# Patient Record
Sex: Female | Born: 1965 | Race: White | Hispanic: No | Smoking: Former smoker
Health system: Southern US, Community
[De-identification: ages and names within clinical notes are randomized; demographics above are authoritative.]

## PROBLEM LIST (undated history)

## (undated) ENCOUNTER — Inpatient Hospital Stay (HOSPITAL_COMMUNITY): Payer: 59 | Admitting: Podiatry

## (undated) ENCOUNTER — Inpatient Hospital Stay (HOSPITAL_BASED_OUTPATIENT_CLINIC_OR_DEPARTMENT_OTHER): Payer: 59 | Admitting: Podiatry

## (undated) DIAGNOSIS — R51 Headache: Secondary | ICD-10-CM

## (undated) DIAGNOSIS — R413 Other amnesia: Secondary | ICD-10-CM

## (undated) DIAGNOSIS — M791 Myalgia, unspecified site: Secondary | ICD-10-CM

## (undated) DIAGNOSIS — R609 Edema, unspecified: Secondary | ICD-10-CM

## (undated) DIAGNOSIS — K589 Irritable bowel syndrome without diarrhea: Secondary | ICD-10-CM

## (undated) DIAGNOSIS — F32A Depression, unspecified: Secondary | ICD-10-CM

## (undated) DIAGNOSIS — M1990 Unspecified osteoarthritis, unspecified site: Secondary | ICD-10-CM

## (undated) DIAGNOSIS — K760 Fatty (change of) liver, not elsewhere classified: Secondary | ICD-10-CM

## (undated) DIAGNOSIS — R5383 Other fatigue: Secondary | ICD-10-CM

## (undated) DIAGNOSIS — R42 Dizziness and giddiness: Secondary | ICD-10-CM

## (undated) DIAGNOSIS — E11319 Type 2 diabetes mellitus with unspecified diabetic retinopathy without macular edema: Secondary | ICD-10-CM

## (undated) DIAGNOSIS — C20 Malignant neoplasm of rectum: Secondary | ICD-10-CM

## (undated) DIAGNOSIS — M79669 Pain in unspecified lower leg: Secondary | ICD-10-CM

## (undated) DIAGNOSIS — E119 Type 2 diabetes mellitus without complications: Secondary | ICD-10-CM

## (undated) DIAGNOSIS — R519 Headache, unspecified: Secondary | ICD-10-CM

## (undated) DIAGNOSIS — F329 Major depressive disorder, single episode, unspecified: Secondary | ICD-10-CM

## (undated) DIAGNOSIS — Z87442 Personal history of urinary calculi: Secondary | ICD-10-CM

## (undated) HISTORY — PX: HERNIA REPAIR: SHX51

## (undated) HISTORY — PX: ABDOMINAL HYSTERECTOMY: SHX81

## (undated) HISTORY — DX: Headache, unspecified: R51.9

## (undated) HISTORY — DX: Other fatigue: R53.83

## (undated) HISTORY — DX: Pain in unspecified lower leg: M79.669

## (undated) HISTORY — PX: BREAST SURGERY: SHX581

## (undated) HISTORY — DX: Myalgia, unspecified site: M79.10

## (undated) HISTORY — DX: Depression, unspecified: F32.A

## (undated) HISTORY — DX: Type 2 diabetes mellitus without complications: E11.9

## (undated) HISTORY — DX: Malignant neoplasm of rectum: C20

## (undated) HISTORY — DX: Dizziness and giddiness: R42

## (undated) HISTORY — PX: OTHER SURGICAL HISTORY: SHX169

## (undated) HISTORY — PX: BLADDER SURGERY: SHX569

## (undated) HISTORY — DX: Major depressive disorder, single episode, unspecified: F32.9

## (undated) HISTORY — PX: KNEE SURGERY: SHX244

## (undated) HISTORY — DX: Edema, unspecified: R60.9

## (undated) HISTORY — DX: Other amnesia: R41.3

## (undated) HISTORY — DX: Irritable bowel syndrome, unspecified: K58.9

## (undated) HISTORY — DX: Headache: R51

## (undated) HISTORY — PX: TOE AMPUTATION: SHX809

## (undated) MED FILL — Leucovorin Calcium For Inj 350 MG: INTRAMUSCULAR | Qty: 37.2 | Status: AC

---

## 2003-03-23 ENCOUNTER — Ambulatory Visit (HOSPITAL_BASED_OUTPATIENT_CLINIC_OR_DEPARTMENT_OTHER): Admission: RE | Admit: 2003-03-23 | Discharge: 2003-03-23 | Payer: Self-pay | Admitting: Orthopedic Surgery

## 2003-03-23 ENCOUNTER — Encounter (INDEPENDENT_AMBULATORY_CARE_PROVIDER_SITE_OTHER): Payer: Self-pay | Admitting: *Deleted

## 2003-07-24 ENCOUNTER — Encounter: Admission: RE | Admit: 2003-07-24 | Discharge: 2003-07-24 | Payer: Self-pay | Admitting: Sports Medicine

## 2003-07-24 ENCOUNTER — Encounter: Payer: Self-pay | Admitting: Sports Medicine

## 2008-05-02 ENCOUNTER — Ambulatory Visit (HOSPITAL_BASED_OUTPATIENT_CLINIC_OR_DEPARTMENT_OTHER): Admission: RE | Admit: 2008-05-02 | Discharge: 2008-05-03 | Payer: Self-pay | Admitting: Specialist

## 2008-05-02 ENCOUNTER — Encounter (INDEPENDENT_AMBULATORY_CARE_PROVIDER_SITE_OTHER): Payer: Self-pay | Admitting: Specialist

## 2011-02-26 NOTE — Op Note (Signed)
NAMESTEPFANIE, Gloria Hodge           ACCOUNT NO.:  [id number]   MEDICAL RECORD NO.:  [id number]          PATIENT TYPE:  AMB   LOCATION:  DSC                          FACILITY:  [facility]   PHYSICIAN:  Earvin Hansen L. Truesdale, M.D.DATE OF BIRTH:  Mar 05, 1966   DATE OF PROCEDURE:  05/02/2008  DATE OF DISCHARGE:                               OPERATIVE REPORT   A 45 year old lady with severe macromastia and back and shoulder pain  secondary to large pendulous breasts with increased accessory breast  tissue.   PROCEDURES DONE:  Bilateral breast reductions using the inferior pedicle  technique.   ANESTHESIA:  General.   DESCRIPTION OF PROCEDURE:  The patient underwent general anesthesia and  intubated orally after she had undergone drawings for the inferior-  pedicle reduction mammoplasty.  After she was intubated, prep was done  to the chest and breast areas in routine fashion using the Hibiclens  soap and solution, walled off with sterile towels and drapes so as to  make a sterile field.  The areas were scored with #15 blade, and the  skin of the inferior pedicle was deepithelialized with a #20 blade.  Medial and lateral fatty dermal pedicles were excised down to underlying  fascia.  After proper hemostasis, the new keyhole area was also debulked  and also accessory breast tissue was removed.  After proper hemostasis,  the flaps were transposed and stayed with 3-0 Prolene.  Subcutaneously,  tissues were closed with 3-0 Monocryl x2 layers and running subcuticular  stitches of 3-0 Monocryl and 5-0 Monocryl throughout the inverted T.  The wounds were drained with #10 fully fluted Blake drains, which were  placed in the depths of the wound and brought out through the lateral-  most portion of the incisions and secured with 3-0 Prolene; same  procedure was carried on both sides.  Steri-Strips and soft dressing  were applied as well as silicone gel patch.  As the patient is Hispanic,  we are going to  try to keep the scar as thin as possible.  She was then  taken to Recovery in excellent condition.   ESTIMATED BLOOD LOSS:  Less than 150 mL.   COMPLICATIONS:  None.      Yaakov Guthrie. Shon Hough, M.D.  Electronically Signed     GLT/MEDQ  D:  05/02/2008  T:  05/03/2008  Job:  [id number]

## 2011-03-01 NOTE — Op Note (Signed)
   NAMEJAKAIYA, Gloria Hodge                       ACCOUNT NO.:  [id number]   MEDICAL RECORD NO.:  [id number]                   PATIENT TYPE:  AMB   LOCATION:  DSC                                  FACILITY:  [facility]   PHYSICIAN:  Artist Pais. Mina Marble, M.D.           DATE OF BIRTH:  1965/12/10   DATE OF PROCEDURE:  03/23/2003  DATE OF DISCHARGE:                                 OPERATIVE REPORT   PREOPERATIVE DIAGNOSIS:  Right long middle phalangeal enchondroma.   POSTOPERATIVE DIAGNOSIS:  Right long middle phalangeal enchondroma.   PROCEDURE:  Curettage and bone graft above with distal radial bone graft and  Allometrics supplement.   SURGEON:  Artist Pais. Mina Marble, M.D.   ASSISTANT:  Aura Fey. Bobbe Medico.   ANESTHESIA:  General.   TOURNIQUET TIME:  45 minutes.   COMPLICATIONS:  None.   DRAINS:  None.   DESCRIPTION OF PROCEDURE:  The patient was taken to the operating room and  after the induction of adequate general anesthesia, the right upper  extremity was prepped and draped in the usual sterile fashion. An Esmarch  was used to exsanguinate the limb. The tourniquet was then inflated to 250  mmHg.  At this point in time, a longitudinal incision was made midline over  the middle phalanx of the right long finger. Incision was taken down through  the skin and subcutaneous tissues.  The extensor mechanism was identified  and retracted at the radial side and a subperiosteal dissection was  undertaken at the base of the middle phalanx on the ulnar side.  Intraoperative x-rays showed the enchondroma.  The enchondroma was carefully  curetted out after a cortical window was made dorsally and sent for  pathologic diagnosis.  The wound was thoroughly irrigated. A second incision  was then made over the dorsal radial aspect of the right wrist and a  cortical window was made in the radius. Bone graft was obtained and mixed  with Allometrics and then placed into enchondroma site and remaining  Allometrics was placed in the donor site.  Both wounds were thoroughly  irrigated.  The wounds were closed with 3-0 Prolene in subcuticular  stitches. Steri-Strips, 4x4's, fluffs, and a volar splint was applied to the  middle finger and wrist. The patient tolerated the procedure well and went  to the recovery room in stable condition.                                               Artist Pais Mina Marble, M.D.    MAW/MEDQ  D:  03/23/2003  T:  03/23/2003  Job:  [id number]

## 2011-07-12 LAB — BASIC METABOLIC PANEL
BUN: 10
CO2: 25
Calcium: 8.7
Chloride: 105
Creatinine, Ser: 0.73
GFR calc Af Amer: >60
GFR calc non Af Amer: >60
Glucose, Bld: 234 — ABNORMAL HIGH
Potassium: 4.8
Sodium: 137

## 2011-07-12 LAB — POCT HEMOGLOBIN-HEMACUE: Hemoglobin: 14.6

## 2012-06-23 ENCOUNTER — Other Ambulatory Visit: Payer: Self-pay | Admitting: Internal Medicine

## 2012-06-23 DIAGNOSIS — M545 Low back pain, unspecified: Secondary | ICD-10-CM

## 2012-06-27 ENCOUNTER — Ambulatory Visit
Admission: RE | Admit: 2012-06-27 | Discharge: 2012-06-27 | Disposition: A | Discharge status: Home / Self Care | Payer: Managed Care, Other (non HMO) | Source: Ambulatory Visit | Attending: Internal Medicine | Admitting: Internal Medicine

## 2012-06-27 DIAGNOSIS — M545 Low back pain, unspecified: Secondary | ICD-10-CM

## 2012-09-22 ENCOUNTER — Other Ambulatory Visit: Payer: Self-pay | Admitting: Neurology

## 2012-09-22 ENCOUNTER — Other Ambulatory Visit: Payer: Self-pay | Admitting: Physical Therapy

## 2012-09-22 DIAGNOSIS — M5412 Radiculopathy, cervical region: Secondary | ICD-10-CM

## 2012-09-27 ENCOUNTER — Ambulatory Visit
Admission: RE | Admit: 2012-09-27 | Discharge: 2012-09-27 | Disposition: A | Discharge status: Home / Self Care | Payer: 59 | Source: Ambulatory Visit | Attending: Neurology | Admitting: Neurology

## 2012-09-27 DIAGNOSIS — M5412 Radiculopathy, cervical region: Secondary | ICD-10-CM

## 2013-12-23 ENCOUNTER — Ambulatory Visit (INDEPENDENT_AMBULATORY_CARE_PROVIDER_SITE_OTHER): Payer: 59

## 2013-12-23 VITALS — BP 131/81 | HR 113 | Resp 18

## 2013-12-23 DIAGNOSIS — E1142 Type 2 diabetes mellitus with diabetic polyneuropathy: Secondary | ICD-10-CM

## 2013-12-23 DIAGNOSIS — M79609 Pain in unspecified limb: Secondary | ICD-10-CM

## 2013-12-23 DIAGNOSIS — E1140 Type 2 diabetes mellitus with diabetic neuropathy, unspecified: Secondary | ICD-10-CM

## 2013-12-23 DIAGNOSIS — L608 Other nail disorders: Secondary | ICD-10-CM

## 2013-12-23 DIAGNOSIS — E1149 Type 2 diabetes mellitus with other diabetic neurological complication: Secondary | ICD-10-CM

## 2013-12-23 NOTE — Patient Instructions (Addendum)
Diabetes and Foot Care Diabetes may cause you to have problems because of poor blood supply (circulation) to your feet and legs. This may cause the skin on your feet to become thinner, break easier, and heal more slowly. Your skin may become dry, and the skin may peel and crack. You may also have nerve damage in your legs and feet causing decreased feeling in them. You may not notice minor injuries to your feet that could lead to infections or more serious problems. Taking care of your feet is one of the most important things you can do for yourself.  HOME CARE INSTRUCTIONS  Wear shoes at all times, even in the house. Do not go barefoot. Bare feet are easily injured.  Check your feet daily for blisters, cuts, and redness. If you cannot see the bottom of your feet, use a mirror or ask someone for help.  Wash your feet with warm water (do not use hot water) and mild soap. Then pat your feet and the areas between your toes until they are completely dry. Do not soak your feet as this can dry your skin.  Apply a moisturizing lotion or petroleum jelly (that does not contain alcohol and is unscented) to the skin on your feet and to dry, brittle toenails. Do not apply lotion between your toes.  Trim your toenails straight across. Do not dig under them or around the cuticle. File the edges of your nails with an emery board or nail file.  Do not cut corns or calluses or try to remove them with medicine.  Wear clean socks or stockings every day. Make sure they are not too tight. Do not wear knee-high stockings since they may decrease blood flow to your legs.  Wear shoes that fit properly and have enough cushioning. To break in new shoes, wear them for just a few hours a day. This prevents you from injuring your feet. Always look in your shoes before you put them on to be sure there are no objects inside.  Do not cross your legs. This may decrease the blood flow to your feet.  If you find a minor scrape,  cut, or break in the skin on your feet, keep it and the skin around it clean and dry. These areas may be cleansed with mild soap and water. Do not cleanse the area with peroxide, alcohol, or iodine.  When you remove an adhesive bandage, be sure not to damage the skin around it.  If you have a wound, look at it several times a day to make sure it is healing.  Do not use heating pads or hot water bottles. They may burn your skin. If you have lost feeling in your feet or legs, you may not know it is happening until it is too late.  Make sure your health care provider performs a complete foot exam at least annually or more often if you have foot problems. Report any cuts, sores, or bruises to your health care provider immediately. SEEK MEDICAL CARE IF:   You have an injury that is not healing.  You have cuts or breaks in the skin.  You have an ingrown nail.  You notice redness on your legs or feet.  You feel burning or tingling in your legs or feet.  You have pain or cramps in your legs and feet.  Your legs or feet are numb.  Your feet always feel cold. SEEK IMMEDIATE MEDICAL CARE IF:   There is increasing redness,   swelling, or pain in or around a wound.  There is a red line that goes up your leg.  Pus is coming from a wound.  You develop a fever or as directed by your health care provider.  You notice a bad smell coming from an ulcer or wound. Document Released: 09/27/2000 Document Revised: 06/02/2013 Document Reviewed: 03/09/2013 [facility] Patient Information 2014 [place].  For the diabetic neuropathy strong recommendations to followup with diabetes nutritionist at [facility] and advice about your diet.  Also suggested a multivitamin supplement to be taken every day with specific attention to the B complex and folic acid vitamins.  Maintain a good shoe and cotton or acrylic socks at all times, no barefoot no flip-flops.

## 2013-12-23 NOTE — Progress Notes (Signed)
   Subjective:    Patient ID: Gloria Hodge, female    DOB: May 16, 1966, 48 y.o.   MRN: [id number]  HPI Saw Dr. Jannette Hodge and referred me over here to get my feet checked since I am a diabetic and have been since 2004 and numbness and sharp pain and makes my feet jerk and my toes feel like they are wrapped in gauze and feels to tight and some swelling and my feet get cold and my big toenails are not healing due to I went and had a pedicure done last may and no draining     Review of Systems  Constitutional: Positive for fatigue.  HENT:       Sneezing  Eyes: Positive for pain.  Cardiovascular: Positive for leg swelling.       Calf pain when walking  Gastrointestinal: Positive for abdominal pain and constipation.  Endocrine:       Excessive thirst and increase urination  Musculoskeletal: Positive for back pain.  Neurological: Positive for dizziness and numbness.  Hematological:       Slow to heal   All other systems reviewed and are negative.       Objective:   Physical Exam Vascular status as follows pedal pulses palpable DP and PT +2/4 bilateral capillary refill time 3 seconds all digits skin temperature warm turgor diminished absent hair growth is noted no edema rubor pallor noted neurologically epicritic and proprioceptive sensations intact and symmetric although diminished on Semmes Weinstein testing to the hallux and plantar first MTP joint. Patient had told sensation as well as decreased vibratory sensation in the forefoot and dorsum of the foot. There is an orthopedic biomechanical exam rectus foot type some weakness and strength and the digits and midfoot range of motion active passive range of motion of possible although somewhat restricted patient feels a tightness or aching in the bones at times. Nails thick criptotic incurvated hallux bilateral medial border right lateral border left patient had history paronychia she is cutting them on her own this time a tongue 2 short there  is no active discharge or drainage although scars identified. Patient may be K. for future AP nail procedure she continues to have difficulties with ingrowing nails.       Assessment & Plan:  Assessment this time his diabetes with peripheral neuropathy. Should note patient's most recent blood glucose yesterday was over 500 patient indicates that she will be starting on insulin associated Dr. about that later today or tomorrow. Patient is also not and education about diabetes complications with feet or about nutrition. Recommend to followup with the diabetes nutritionist at [facility] suggested a multivitamin daily especially the B6 X50 and folic acid complexes. Also recommended socks and shoes at all times and literature on diabetic foot care dispensed the patient suggest a 3-6 month followup in the future may be candidate for palliative nail care as needed  Gloria Hodge DPM

## 2014-03-24 ENCOUNTER — Ambulatory Visit (INDEPENDENT_AMBULATORY_CARE_PROVIDER_SITE_OTHER): Payer: 59

## 2014-03-24 VITALS — BP 154/95 | HR 91 | Resp 18

## 2014-03-24 DIAGNOSIS — E1140 Type 2 diabetes mellitus with diabetic neuropathy, unspecified: Secondary | ICD-10-CM

## 2014-03-24 DIAGNOSIS — M79609 Pain in unspecified limb: Secondary | ICD-10-CM

## 2014-03-24 DIAGNOSIS — L03039 Cellulitis of unspecified toe: Secondary | ICD-10-CM

## 2014-03-24 DIAGNOSIS — L600 Ingrowing nail: Secondary | ICD-10-CM

## 2014-03-24 MED ORDER — CEPHALEXIN 500 MG PO CAPS: 500.0000 mg | ORAL_CAPSULE | Freq: Three times a day (TID) | ORAL | Status: DC

## 2014-03-24 MED ORDER — OXYCODONE-ACETAMINOPHEN 10-325 MG PO TABS: 1.0000 | ORAL_TABLET | ORAL | Status: DC | PRN

## 2014-03-24 NOTE — Patient Instructions (Signed)

## 2014-03-24 NOTE — Progress Notes (Signed)
   Subjective:    Patient ID: Gloria Hodge, female    DOB: 08/22/1966, 48 y.o.   MRN: [id number]  HPI he told me not to cut the nails curve but to cut straight across and feels like the right big toenail is ingrown and will not heal on the left side and no draining and is sore and tender    Review of Systems no new findings or systemic changes     Objective:   Physical Exam Lower extremity objective findings as follows vascular status intact although diminished DP and PT pulses palpable DP postal for PT one over 4 bilateral Refill time 3 seconds all digits skin temperature is warm to cool turgor diminished there is no edema rubor pallor or varicosities noted neurologically epicritic and proprioceptive sensations decreased on Semmes Weinstein testing to forefoot digits and arch. Neurologically skin color pigment normal hair growth absent nails somewhat criptotic the right hallux shows some dried blood and irritation erythema the medial lateral nail folds. Patient continues to dig in the area indicates his hip spica nail grows back paronychia she cannot help but pick at it. Orthopedic exam unremarkable noncontributory rectus foot type noted capillary refill time 3 seconds all digits at this time for my recommendation patient request excision of the nail the lateral borders of the right hallux with phenol matricectomy to be carried out. Local anesthetic block is administered Betadine prep performed the borders were excised feel which 65 alcohol wash Betadine and presto dressing reapplied. Patient given prescriptions for cephalexin 500 mg 3 times a day x10 days for antibiotic also a prescription for pain medication oxycodone 10 mg was offered and printed out however patient declined indicating she still has pain medications at home did not need any additional medicines today. Should he is plain Tylenol or oxycodone she has a home for pain Reappointed in 2 weeks for followup and nail check patient is  instructed in daily soaking his instructions      Assessment & Plan:  Excess assessment recalcitrant ingrowing repetitive ingrowing nail medial lateral borders of the right hallux this time per patient request my recommendation nail excision and phenol matricectomy risk O. is reviewed and this time the nail is excised following local block which 65 alcohol wash Betadine ointment presto dressing is applied recheck in 2 weeks for nail check as instructed contact us if any changes or exacerbations were to occur in the interim. Next  Harriet Masson DPM

## 2014-04-14 ENCOUNTER — Ambulatory Visit: Payer: 59

## 2014-04-21 ENCOUNTER — Ambulatory Visit: Payer: 59

## 2017-09-29 DIAGNOSIS — M869 Osteomyelitis, unspecified: Secondary | ICD-10-CM | POA: Diagnosis not present

## 2017-09-30 DIAGNOSIS — M86672 Other chronic osteomyelitis, left ankle and foot: Secondary | ICD-10-CM | POA: Diagnosis not present

## 2017-09-30 DIAGNOSIS — M869 Osteomyelitis, unspecified: Secondary | ICD-10-CM | POA: Diagnosis not present

## 2017-10-01 DIAGNOSIS — M869 Osteomyelitis, unspecified: Secondary | ICD-10-CM | POA: Diagnosis not present

## 2017-10-02 ENCOUNTER — Encounter: Payer: Self-pay | Admitting: Podiatry

## 2017-10-02 DIAGNOSIS — M869 Osteomyelitis, unspecified: Secondary | ICD-10-CM | POA: Diagnosis not present

## 2017-10-03 ENCOUNTER — Telehealth: Payer: Self-pay | Admitting: Sports Medicine

## 2017-10-03 NOTE — Telephone Encounter (Signed)
Patient called and states that she thinks that she got her dressing got wet. Patient states that she already changed it and put gauze, tape and ace wrap on. I instructed patient to continue with keeping dressing clean and dry and intact and not to tamper with the dressings. Patient expressed understanding and states that she just want to make sure that things were ok. Patient has an appointment on 12-31 with Dr. March Rummage for continued post op care. -Dr. Cannon Kettle

## 2017-10-13 ENCOUNTER — Encounter: Payer: Self-pay | Admitting: Podiatry

## 2017-10-13 ENCOUNTER — Ambulatory Visit (INDEPENDENT_AMBULATORY_CARE_PROVIDER_SITE_OTHER): Payer: 59 | Admitting: Podiatry

## 2017-10-13 DIAGNOSIS — Z9889 Other specified postprocedural states: Secondary | ICD-10-CM

## 2017-10-13 DIAGNOSIS — M2041 Other hammer toe(s) (acquired), right foot: Secondary | ICD-10-CM

## 2017-10-13 NOTE — Progress Notes (Signed)
  Subjective:  Patient ID: Gloria Hodge, female    DOB: 02-13-1966,  MRN: [id number]  Chief Complaint  Patient presents with  . Routine Post Op    i had surgery on 09/30/17 on my left big toe     DOS: Left hallux amputation Procedure: 09/30/2017  51 y.o. female returns for post-op check. Denies N/V/F/Ch. Pain is controlled with current medications.  Planes of pain to the right great and fifth toe  Objective:   General AA&O x3. Normal mood and affect.  Vascular Foot warm and well perfused.  Neurologic Gross sensation intact.  Dermatologic Skin healing well without signs of infection. Skin edges well coapted without signs of infection. Right hallux skin fissure distally  Orthopedic: Tenderness to palpation noted about the surgical site. Tenderness to palpation right fifth toe    Assessment & Plan:  Patient was evaluated and treated and all questions answered.  S/p left hallux amputation -Progressing as expected post-operatively. -Sutures: Intact. -Medications refilled: None -Foot redressed.  Return in about 1 week (around 10/20/2017) for Post-op.  Right toe pain - Hallux due to fissure.  Educated on applying lotion to prevent fissuring of the skin -Silicone toe sleeve dispensed for the right fifth toe      Assessment & Plan:

## 2017-10-21 ENCOUNTER — Ambulatory Visit (INDEPENDENT_AMBULATORY_CARE_PROVIDER_SITE_OTHER): Payer: 59 | Admitting: Podiatry

## 2017-10-21 DIAGNOSIS — Z9889 Other specified postprocedural states: Secondary | ICD-10-CM

## 2017-10-21 MED ORDER — CEPHALEXIN 500 MG PO CAPS: 500.0000 mg | ORAL_CAPSULE | Freq: Three times a day (TID) | ORAL | 0 refills | Status: DC

## 2017-10-22 ENCOUNTER — Encounter: Payer: Self-pay | Admitting: Sports Medicine

## 2017-10-22 ENCOUNTER — Ambulatory Visit (INDEPENDENT_AMBULATORY_CARE_PROVIDER_SITE_OTHER): Payer: 59 | Admitting: Sports Medicine

## 2017-10-22 ENCOUNTER — Telehealth: Payer: Self-pay | Admitting: Podiatry

## 2017-10-22 DIAGNOSIS — Z89419 Acquired absence of unspecified great toe: Secondary | ICD-10-CM | POA: Diagnosis not present

## 2017-10-22 DIAGNOSIS — E1142 Type 2 diabetes mellitus with diabetic polyneuropathy: Secondary | ICD-10-CM

## 2017-10-22 DIAGNOSIS — Z9889 Other specified postprocedural states: Secondary | ICD-10-CM

## 2017-10-22 DIAGNOSIS — S91209A Unspecified open wound of unspecified toe(s) with damage to nail, initial encounter: Secondary | ICD-10-CM | POA: Diagnosis not present

## 2017-10-22 NOTE — Progress Notes (Signed)
Subjective: Gloria Hodge is a 52 y.o. Diabetic female patient seen today in office for drainage from left 2nd toenail. Patient is s/p Left hallux amputation 09-30-17. States that she was cutting nails yesterday and the left 2nd toenail came off and had a lot of bloody drainage. Patient denies pain at surgical site, denies calf pain, denies headache, chest pain, shortness of breath, nausea, vomiting, fever, or chills. Patient states that she picked up the Keflex antibiotic today.No other issues noted.   FBS not recorded out of test strips   There are no active problems to display for this patient.   Current Outpatient Medications on File Prior to Visit  Medication Sig Dispense Refill  . ALPRAZolam (XANAX) 1 MG tablet Take 1 mg by mouth at bedtime as needed for anxiety.    . cephALEXin (KEFLEX) 500 MG capsule Take 1 capsule (500 mg total) by mouth 3 (three) times daily. 30 capsule 0  . cyclobenzaprine (FLEXERIL) 10 MG tablet Take 10 mg by mouth 3 (three) times daily as needed for muscle spasms. Patient doesn't know the mg/lc    . Dapagliflozin Propanediol (FARXIGA PO) Take by mouth.    . metFORMIN (GLUCOPHAGE) 1000 MG tablet Take 1,000 mg by mouth 2 (two) times daily with a meal.    . oxyCODONE-acetaminophen (PERCOCET) 10-325 MG per tablet Take 1 tablet by mouth every 4 (four) hours as needed for pain. 30 tablet 0  . promethazine (PHENERGAN) 12.5 MG tablet Take 12.5 mg by mouth every 6 (six) hours as needed for nausea or vomiting. Patient doesn't know the mg/lc    . rosuvastatin (CRESTOR) 40 MG tablet Take 40 mg by mouth daily.    Marland Kitchen zolpidem (AMBIEN) 10 MG tablet Take 10 mg by mouth at bedtime as needed for sleep.     No current facility-administered medications on file prior to visit.     Allergies  Allergen Reactions  . Bactrim [Sulfamethoxazole-Trimethoprim]     Objective: There were no vitals filed for this visit.  General: No acute distress, AAOx3  Left foot: Sutures intact  with no gapping or dehiscence at surgical amputation site, There is dry blood with no nail at left 2nd toe, no signs of infection, mild swelling to left forefoot, no erythema, no warmth, no drainage, no signs of infection noted, Capillary fill time <5 seconds in all remaninig digits, gross sensation present via light touch to left foot however protective is diminished. No pain or crepitation with range of motion left foot.  No pain with calf compression.   Assessment and Plan:  Problem List Items Addressed This Visit    None    Visit Diagnoses    Nail avulsion of toe, initial encounter    -  Primary   Post-operative state       History of amputation of hallux (HCC)       Diabetic polyneuropathy associated with type 2 diabetes mellitus ([place])         -Patient seen and evaluated -2nd toe assessed -Applied betadine and dry sterile dressing to surgical site and 2nd toe on left foot secured with ACE wrap and stockinet  -Advised patient to make sure to keep dressings clean, dry, and intact to left foot however did give patient sterile dressing supplies in case something comes off or gets wet -Continue with Keflex  -Advised patient to continue with forefoot offloading post-op shoe on left foot   -Advised patient to limit activity to necessity  -Patient to follow up with Dr.  Price next week for continued care. In the meantime, patient to call office if any issues or problems arise.   Landis Martins, DPM

## 2017-10-22 NOTE — Telephone Encounter (Signed)
I saw Dr. March Rummage yesterday and he checked my toe and the side of it looked kind of puffy. Last night I clipped the toenail when I was at home and then its like the toenail came off easily and some kind of drainage came out. If someone would please call me back at [phone]. Thank you.

## 2017-10-22 NOTE — Telephone Encounter (Signed)
Per Dr Cannon Kettle offer patient appt at 330 today to check nail and drainage

## 2017-10-28 ENCOUNTER — Ambulatory Visit (INDEPENDENT_AMBULATORY_CARE_PROVIDER_SITE_OTHER): Payer: 59 | Admitting: Podiatry

## 2017-10-28 ENCOUNTER — Ambulatory Visit (INDEPENDENT_AMBULATORY_CARE_PROVIDER_SITE_OTHER): Payer: 59

## 2017-10-28 DIAGNOSIS — M86171 Other acute osteomyelitis, right ankle and foot: Secondary | ICD-10-CM

## 2017-10-28 DIAGNOSIS — E11621 Type 2 diabetes mellitus with foot ulcer: Secondary | ICD-10-CM

## 2017-10-28 DIAGNOSIS — L97511 Non-pressure chronic ulcer of other part of right foot limited to breakdown of skin: Secondary | ICD-10-CM | POA: Diagnosis not present

## 2017-10-28 DIAGNOSIS — L97501 Non-pressure chronic ulcer of other part of unspecified foot limited to breakdown of skin: Principal | ICD-10-CM

## 2017-11-04 ENCOUNTER — Ambulatory Visit (INDEPENDENT_AMBULATORY_CARE_PROVIDER_SITE_OTHER): Payer: 59 | Admitting: Podiatry

## 2017-11-04 ENCOUNTER — Other Ambulatory Visit: Payer: Self-pay | Admitting: Podiatry

## 2017-11-04 ENCOUNTER — Ambulatory Visit (INDEPENDENT_AMBULATORY_CARE_PROVIDER_SITE_OTHER): Payer: 59

## 2017-11-04 DIAGNOSIS — L97501 Non-pressure chronic ulcer of other part of unspecified foot limited to breakdown of skin: Principal | ICD-10-CM

## 2017-11-04 DIAGNOSIS — M86171 Other acute osteomyelitis, right ankle and foot: Secondary | ICD-10-CM

## 2017-11-04 DIAGNOSIS — E11621 Type 2 diabetes mellitus with foot ulcer: Secondary | ICD-10-CM

## 2017-11-04 MED ORDER — OXYCODONE-ACETAMINOPHEN 10-325 MG PO TABS: 1.0000 | ORAL_TABLET | ORAL | 0 refills | Status: DC | PRN

## 2017-11-04 MED ORDER — CLINDAMYCIN HCL 150 MG PO CAPS: 150.0000 mg | ORAL_CAPSULE | Freq: Three times a day (TID) | ORAL | 0 refills | Status: DC

## 2017-11-04 NOTE — Progress Notes (Signed)
  Subjective:  Patient ID: Gloria Hodge, female    DOB: June 08, 1966,  MRN: [id number]  Chief Complaint  Patient presents with  . Routine Post Op    POV #4 DOS 09/30/17 Amp left 1st toe  . Foot Ulcer    right 4th toe is very painful draining    DOS: Left hallux amputation Procedure: 09/30/2017  52 y.o. female returns for post-op check.  States that the toe is healing well to the left foot.  Endorses worsening of the right fourth toe.  Never got the Betadine that she was supposed to be using to dry the area out.  Objective:   General AA&O x3. Normal mood and affect.  Vascular Foot warm and well perfused.  Neurologic Gross sensation intact.  Dermatologic  left hallux imitation stump well-healed. Right fourth toe open ulceration deep to tendon.  No probe to bone.  Overlying fibrotic wound base.  No erythema ascending cellulitis no local warmth.  Orthopedic: Tenderness to palpation noted about the surgical site. Tenderness to palpation right fifth toe    Assessment & Plan:  Patient was evaluated and treated and all questions answered.  S/p left hallux amputation -Well-healed.  Continue weightbearing in normal shoe gear -Pain medicine refilled per patient request  Right fourth toe osteomyelitis -X-rays taken today show pathologic fracture/erosion of the fourth toe proximal phalanx head. -Fourth toe dressed with Betadine wet-to-dry dressing today patient given Betadine to apply to the toe. -Discussed due to the anteversion and x-ray patient would benefit from amputation of the digit.  We will plan for next week.  All risk benefits alternatives explained to patient no guarantees given.  Consent forms reviewed and signed by patient.  - Rx for clindamycin today to prevent spread of infection  15 minutes of face to face time were spent with the patient. >50% of this was spent on counseling and coordination of care. Specifically discussed with patient the above diagnosis and treatment  plan regarding her L 4th toe.

## 2017-11-04 NOTE — Patient Instructions (Signed)
Pre-Operative Instructions  Congratulations, you have decided to take an important step towards improving your quality of life.  You can be assured that the doctors and staff at [facility] will be with you every step of the way.  Here are some important things you should know:  1. Plan to be at the surgery center/hospital at least 1 (one) hour prior to your scheduled time, unless otherwise directed by the surgical center/hospital staff.  You must have a responsible adult accompany you, remain during the surgery and drive you home.  Make sure you have directions to the surgical center/hospital to ensure you arrive on time. 2. If you are having surgery at [facility] or [facility], you will need a copy of your medical history and physical form from your family physician within one month prior to the date of surgery. We will give you a form for your primary physician to complete.  3. We make every effort to accommodate the date you request for surgery.  However, there are times where surgery dates or times have to be moved.  We will contact you as soon as possible if a change in schedule is required.   4. No aspirin/ibuprofen for one week before surgery.  If you are on aspirin, any non-steroidal anti-inflammatory medications (Mobic, Aleve, Ibuprofen) should not be taken seven (7) days prior to your surgery.  You make take Tylenol for pain prior to surgery.  5. Medications - If you are taking daily heart and blood pressure medications, seizure, reflux, allergy, asthma, anxiety, pain or diabetes medications, make sure you notify the surgery center/hospital before the day of surgery so they can tell you which medications you should take or avoid the day of surgery. 6. No food or drink after midnight the night before surgery unless directed otherwise by surgical center/hospital staff. 7. No alcoholic beverages 24-hours prior to surgery.  No smoking 24-hours prior or 24-hours after  surgery. 8. Wear loose pants or shorts. They should be loose enough to fit over bandages, boots, and casts. 9. Don't wear slip-on shoes. Sneakers are preferred. 10. Bring your boot with you to the surgery center/hospital.  Also bring crutches or a walker if your physician has prescribed it for you.  If you do not have this equipment, it will be provided for you after surgery. 11. If you have not been contacted by the surgery center/hospital by the day before your surgery, call to confirm the date and time of your surgery. 12. Leave-time from work may vary depending on the type of surgery you have.  Appropriate arrangements should be made prior to surgery with your employer. 13. Prescriptions will be provided immediately following surgery by your doctor.  Fill these as soon as possible after surgery and take the medication as directed. Pain medications will not be refilled on weekends and must be approved by the doctor. 14. Remove nail polish on the operative foot and avoid getting pedicures prior to surgery. 15. Wash the night before surgery.  The night before surgery wash the foot and leg well with water and the antibacterial soap provided. Be sure to pay special attention to beneath the toenails and in between the toes.  Wash for at least three (3) minutes. Rinse thoroughly with water and dry well with a towel.  Perform this wash unless told not to do so by your physician.  Enclosed: 1 Ice pack (please put in freezer the night before surgery)   1 Hibiclens skin cleaner     Pre-op instructions  If you have any questions regarding the instructions, please do not hesitate to call our office.  [place]: [address], [place], [place] [address] -- [phone]  [place]: [address] [place], [place] [address] -- [phone]  [place]: [address]  [place], [place] [address] -- [phone]  [place]: [address], [address], [place],  [address] -- [phone]  Website:  [email or web address] 

## 2017-11-07 ENCOUNTER — Telehealth: Payer: Self-pay | Admitting: *Deleted

## 2017-11-07 ENCOUNTER — Other Ambulatory Visit: Payer: Self-pay | Admitting: Podiatry

## 2017-11-07 DIAGNOSIS — M86171 Other acute osteomyelitis, right ankle and foot: Secondary | ICD-10-CM

## 2017-11-07 NOTE — Telephone Encounter (Signed)
I'm calling to let you know we have you scheduled for surgery on Tuesday, January 29 at [facility].  It will be around lunch time.  Someone from the surgical center will call you with the arrival time a day or two before.  Have you had your history and physical form completed and faxed?  "Dr. Trudie Reed' (?) office said they had faxed it to you already.  Did you not get it?"  No, I have not received it.  Can you get them to send me a copy of it?  "I'm driving right now, can you text me the fax number?"  I can call you back and leave a voicemail.  "Okay, that will be fine.  I will give them a call."

## 2017-11-10 ENCOUNTER — Telehealth: Payer: Self-pay | Admitting: *Deleted

## 2017-11-10 NOTE — Telephone Encounter (Signed)
"  We received the medical clearance letter from Gloria Hodge's doctor but we haven't received the history and physical form from her doctor.  We need it by tomorrow."  I called and informed Gloria Hodge that we needed the history and physical form completed by her primary care physician.  I informed her we received a prescription note stating she is medically cleared but we need a chart note or the history and physical form filled out by the primary care physician and faxed to me.  She asked what would happen if we didn't get the completed forms.  I told her she would not be able to have the surgery.  She asked me to call her back and leave a voicemail message telling her my fax number.  I called her back and left a voicemail message to have it faxed to [phone].

## 2017-11-11 ENCOUNTER — Encounter: Payer: Self-pay | Admitting: Podiatry

## 2017-11-11 DIAGNOSIS — M86679 Other chronic osteomyelitis, unspecified ankle and foot: Secondary | ICD-10-CM | POA: Diagnosis not present

## 2017-11-12 ENCOUNTER — Telehealth: Payer: Self-pay | Admitting: Podiatry

## 2017-11-12 NOTE — Telephone Encounter (Signed)
I told pt that Dr. March Rummage wanted her to have a sterile dressing on the foot. I had Bethann Humble - Record Coordinator call Dr. Cannon Kettle to see if she could change pt's dressing, she said she could. I asked if pt could go now and she said she did not have any one to drive her the neighbor that took her to the surgery yesterday is sick, and she gets to scared to drive to [place]. I told pt Dr. March Rummage stated if she could not get to [facility] or [place] to go to the ER and pt states she has taken a pain pill and can't drive will go to the ER tomorrow. I told pt that would be okay.

## 2017-11-12 NOTE — Telephone Encounter (Addendum)
Priscille Loveless - Scheduler states [place] is closed. I told Levada Dy to get pt to the [facility] office tomorrow. Levada Dy states pt refusing to come to [facility] for sterile dressing change. I was unable to get the [place] office to answer to get dressing changed today per Dr. March Rummage.

## 2017-11-12 NOTE — Telephone Encounter (Addendum)
Pt states she had surgery yesterday and the dressing was completely bloody and later came off, and she has redressed it and thinks it has stopped bleeding. I told pt that she needed to have a sterile dressing in place and I would transfer to schedulers to get an appt for tomorrow. Pt states understanding and was transferred.

## 2017-11-12 NOTE — Telephone Encounter (Signed)
Pt had surgery with you yesterday and she just had some questions and her guaze was completely bloody this am.

## 2017-11-13 ENCOUNTER — Encounter: Payer: Self-pay | Admitting: Podiatry

## 2017-11-13 ENCOUNTER — Telehealth: Payer: Self-pay | Admitting: *Deleted

## 2017-11-13 NOTE — Telephone Encounter (Signed)
Called the patient back and stated that per Dr March Rummage it is ok for the bleeding and just to keep it elevated and ice and if any concerns or questions call the office. Lattie Haw

## 2017-11-13 NOTE — Progress Notes (Signed)
  Subjective:  Patient ID: Gloria Hodge, female    DOB: September 11, 1966,  MRN: [id number]  Chief Complaint  Patient presents with  . Routine Post Op    POV #2 DOS 09/30/17 Amp left 1st toe [facility])  possible sutures     DOS: 09/30/2017 Procedure: Revision left first toe  52 y.o. female returns for post-op check. Denies N/V/F/Ch. Pain is controlled with current medications.  Objective:   General AA&O x3. Normal mood and affect.  Vascular Foot warm and well perfused.  Neurologic Gross sensation intact.  Dermatologic Skin healing well without signs of infection. Skin edges well coapted without signs of infection.  Orthopedic: Tenderness to palpation noted about the surgical site.    Assessment & Plan:  Patient was evaluated and treated and all questions answered.  S/p left great toe amputation -Progressing as expected post-operatively. -Sutures: Left intact. -Medications refilled: None -Foot redressed.  Return in about 1 week (around 10/28/2017) for post-op.

## 2017-11-13 NOTE — Progress Notes (Signed)
  Subjective:  Patient ID: Gloria Hodge, female    DOB: 05-04-1966,  MRN: [id number]  Chief Complaint  Patient presents with  . Routine Post Op    POV #3 DOS 09/30/17 Amp left 1st toe  . Blister    new prob blister on right 4th with pain to the bone     DOS: 09/30/2017 Procedure: Left great toe amputation  52 y.o. female returns for post-op check. Denies N/V/F/Ch.  Doing well on the left side.  Reports new issue with a blister on the right fourth toe that hurts to the bone.  Objective:   General AA&O x3. Normal mood and affect.  Vascular Foot warm and well perfused.  Neurologic Gross sensation intact.  Dermatologic Skin healing well without signs of infection. Skin edges well coapted without signs of infection.  Right fourth toe ulceration at the lateral aspect of the fourth toe with maceration, fibrotic wound base.  No probe to bone.  Orthopedic: Tenderness to palpation noted about the surgical site.   Assessment & Plan:  Patient was evaluated and treated and all questions answered.  S/p left great toe amputation -Progressing as expected post-operatively. -Sutures: Removed.. -Medications refilled: None -Foot redressed.  Right fourth toe ulcer -X-rays taken reviewed no evidence of osseous erosions -Apply Betadine daily to the wound with dry sterile dressing -Dressed with Betadine and DSD today  Return in about 1 week (around 11/04/2017) for Wound Care, Post-op.

## 2017-11-17 NOTE — Progress Notes (Signed)
DOS 11/11/17 Amputation of Rt 4th toe

## 2017-11-18 ENCOUNTER — Ambulatory Visit (INDEPENDENT_AMBULATORY_CARE_PROVIDER_SITE_OTHER): Payer: 59 | Admitting: Podiatry

## 2017-11-18 ENCOUNTER — Ambulatory Visit (INDEPENDENT_AMBULATORY_CARE_PROVIDER_SITE_OTHER): Payer: 59

## 2017-11-18 DIAGNOSIS — L97521 Non-pressure chronic ulcer of other part of left foot limited to breakdown of skin: Secondary | ICD-10-CM

## 2017-11-18 MED ORDER — CEPHALEXIN 500 MG PO CAPS: 500.0000 mg | ORAL_CAPSULE | Freq: Three times a day (TID) | ORAL | 0 refills | Status: DC

## 2017-11-18 NOTE — Progress Notes (Signed)
  Subjective:  Patient ID: Gloria Hodge, female    DOB: 08/16/1966,  MRN: [id number]  Chief Complaint  Patient presents with  . Routine Post Op     pov#1 dos 01.29.2019 Amputation 4th Rt  . Foot Ulcer    ulcer left 2nd toe     DOS: 11/11/2017 Procedure: Amputation right fourth toe  52 y.o. female returns for post-op check and for follow-up of left second toe wound. Denies N/V/F/Ch. Pain is controlled with current medications.  States that she got her dressing wet 2 hours before coming to the office which occurred when she bathed.  Has been applying Betadine to the left second toe ulceration Objective:   General AA&O x3. Normal mood and affect.  Vascular Foot warm and well perfused.  Neurologic Gross sensation intact.  Dermatologic Skin healing well without signs of infection.  Skin edges rather coapted with intact suture and staple material  Left second toe distal tip ulceration with without probe to bone.  0.3 x 0.3 x 0.1.  No erythema.  No signs of acute infection  Orthopedic: Tenderness to palpation noted about the surgical site.    Assessment & Plan:  Patient was evaluated and treated and all questions answered.  Left second toe ulceration -X-rays taken and reviewed.  Evidence of osseous erosion of the distal phalanx of the second toe. -We will discussed x-ray findings with patient next visit.  Though the x-rays does show osseous erosion will hold off surgical intervention until clinical condition warrants.  Refill antibiotics today -Toe dressed with Betadine wet-to-dry dressing  -Debridement of the ulcer as below  Procedure: Selective Debridement of Wound Rationale: Removal of devitalized tissue from the wound to promote healing.  Pre-Debridement Wound Measurements: 0.3 cm x 0.3 cm x 0.1 cm  Post-Debridement Wound Measurements: same as pre-debridement. Type of Debridement: Selective Tissue Removed: Devitalized soft-tissue Instrumentation: 312 blade Dressing: Dry,  sterile, compression dressing. Disposition: Patient tolerated procedure well. Patient to return in 1 week for follow-up.   S/p right fourth toe amputation -Progressing as expected post-operatively. -Sutures: Intact. -Medications refilled: Keflex -Foot redressed.  Betadine applied to the incision.  No Follow-up on file.

## 2017-11-24 ENCOUNTER — Telehealth: Payer: Self-pay | Admitting: Podiatry

## 2017-11-24 NOTE — Telephone Encounter (Signed)
I have a post-op appointment tomorrow at 2:45 pm. I woke up this morning and I feel like I got the flu. Can you please call me back at [phone] and tell me if I should reschedule or what I should do. Thank you.

## 2017-11-25 ENCOUNTER — Encounter: Payer: 59 | Admitting: Podiatry

## 2017-12-02 ENCOUNTER — Ambulatory Visit (INDEPENDENT_AMBULATORY_CARE_PROVIDER_SITE_OTHER): Payer: 59 | Admitting: Podiatry

## 2017-12-02 DIAGNOSIS — M86172 Other acute osteomyelitis, left ankle and foot: Secondary | ICD-10-CM

## 2017-12-02 MED ORDER — CLINDAMYCIN HCL 150 MG PO CAPS: 150.0000 mg | ORAL_CAPSULE | Freq: Three times a day (TID) | ORAL | 0 refills | Status: DC

## 2017-12-02 NOTE — Patient Instructions (Signed)
Pre-Operative Instructions  Congratulations, you have decided to take an important step towards improving your quality of life.  You can be assured that the doctors and staff at [facility] will be with you every step of the way.  Here are some important things you should know:  1. Plan to be at the surgery center/hospital at least 1 (one) hour prior to your scheduled time, unless otherwise directed by the surgical center/hospital staff.  You must have a responsible adult accompany you, remain during the surgery and drive you home.  Make sure you have directions to the surgical center/hospital to ensure you arrive on time. 2. If you are having surgery at [facility] or  hospitals, you will need a copy of your medical history and physical form from your family physician within one month prior to the date of surgery. We will give you a form for your primary physician to complete.  3. We make every effort to accommodate the date you request for surgery.  However, there are times where surgery dates or times have to be moved.  We will contact you as soon as possible if a change in schedule is required.   4. No aspirin/ibuprofen for one week before surgery.  If you are on aspirin, any non-steroidal anti-inflammatory medications (Mobic, Aleve, Ibuprofen) should not be taken seven (7) days prior to your surgery.  You make take Tylenol for pain prior to surgery.  5. Medications - If you are taking daily heart and blood pressure medications, seizure, reflux, allergy, asthma, anxiety, pain or diabetes medications, make sure you notify the surgery center/hospital before the day of surgery so they can tell you which medications you should take or avoid the day of surgery. 6. No food or drink after midnight the night before surgery unless directed otherwise by surgical center/hospital staff. 7. No alcoholic beverages 24-hours prior to surgery.  No smoking 24-hours prior or 24-hours after  surgery. 8. Wear loose pants or shorts. They should be loose enough to fit over bandages, boots, and casts. 9. Don't wear slip-on shoes. Sneakers are preferred. 10. Bring your boot with you to the surgery center/hospital.  Also bring crutches or a walker if your physician has prescribed it for you.  If you do not have this equipment, it will be provided for you after surgery. 11. If you have not been contacted by the surgery center/hospital by the day before your surgery, call to confirm the date and time of your surgery. 12. Leave-time from work may vary depending on the type of surgery you have.  Appropriate arrangements should be made prior to surgery with your employer. 13. Prescriptions will be provided immediately following surgery by your doctor.  Fill these as soon as possible after surgery and take the medication as directed. Pain medications will not be refilled on weekends and must be approved by the doctor. 14. Remove nail polish on the operative foot and avoid getting pedicures prior to surgery. 15. Wash the night before surgery.  The night before surgery wash the foot and leg well with water and the antibacterial soap provided. Be sure to pay special attention to beneath the toenails and in between the toes.  Wash for at least three (3) minutes. Rinse thoroughly with water and dry well with a towel.  Perform this wash unless told not to do so by your physician.  Enclosed: 1 Ice pack (please put in freezer the night before surgery)   1 Hibiclens skin cleaner     Pre-op instructions  If you have any questions regarding the instructions, please do not hesitate to call our office.  [place]: [address], [place], [place] [address] -- [phone]  [place]: [address] , [place] [address] -- [phone]  [place]: [address]  [place], [place] [address] -- [phone]  [place]: [address], [place], [place] [address] -- [phone]  Website:  [email or web address] 

## 2017-12-03 ENCOUNTER — Telehealth: Payer: Self-pay | Admitting: *Deleted

## 2017-12-03 NOTE — Progress Notes (Signed)
  Subjective:  Patient ID: Gloria Hodge, female    DOB: [birth year],  MRN: [id number]  No chief complaint on file.  DOS: 11/11/17 Procedure: Indication right fourth toe  52 y.o. female returns for post-op check. Denies N/V/F/Ch.  Denies pain.  Concerned about the appearance of her left second toe.  States the toe is swollen and looks like a "mushroom"  Recently experiencing the flu.  Objective:   General AA&O x3. Normal mood and affect.  Vascular Foot warm and well perfused.  Neurologic Gross sensation intact.  Dermatologic Skin healing well without signs of infection. Skin edges healing well deep with small fissure not healed superficially. Left hallux imitation site well-healed  Left second toe edema of the distal phalanx.distal tip ulcer with granular wound base measuring about 0.2x0.2 without probe to bone today  Orthopedic: Tenderness to palpation noted about the surgical site. Left hallux amputation Tenderness to palpation about the left second toe    Assessment & Plan:  Patient was evaluated and treated and all questions answered.  S/p right fourth toe amputation -Progressing as expected post-operatively. -Sutures: Partial suture and staple removal.  -Foot redressed.  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  All risks benefits and alternatives explained no guarantees given.  Patient agreed to proceed.  Forms given to patient for preop history and physical.  We will plan for a time at [facility]  15 minutes of face to face time were spent with the patient. >50% of this was spent on counseling and coordination of care. Specifically discussed with patient the above diagnosis and treatment plans for  the left second toe.  Discussed patient the factors in her life that seem to be impeding her ability to heal.  Patient reports significant home stressors and controlling her sugars.  Discussed seeking help for possible depression however patient states that she has no financial means to do so.   Return in about 1 week (around 12/09/2017).

## 2017-12-03 NOTE — Telephone Encounter (Signed)
"  I'm a patient of Dr. March Rummage.  I saw him yesterday in the [place] office.  I would like to schedule my surgery."  I don't have your paperwork at this time.  Where is your surgery going to be done?  "It's going to be at [facility]."  Dr. Cannon Kettle can do it on March 13.  "That date will be fine."  Your history and physical form has to be completed and your appointment with the physician has to have been within 30 days of the surgery date.  Someone from the surgical center will call you with the arrival time a day or two prior to your appointment.  I need the scheduling information from [facility].

## 2017-12-03 NOTE — Telephone Encounter (Signed)
This is a current Dr. March Rummage patient

## 2017-12-05 NOTE — Telephone Encounter (Addendum)
I am calling to see if we can schedule your surgery for March 12 instead of March 14.  "Sure, that will be fine."  I will get it scheduled for March 12 at [facility].  Thank you.

## 2017-12-09 ENCOUNTER — Ambulatory Visit (INDEPENDENT_AMBULATORY_CARE_PROVIDER_SITE_OTHER): Payer: 59 | Admitting: Podiatry

## 2017-12-09 DIAGNOSIS — L97521 Non-pressure chronic ulcer of other part of left foot limited to breakdown of skin: Secondary | ICD-10-CM | POA: Diagnosis not present

## 2017-12-09 NOTE — Progress Notes (Signed)
  Subjective:  Patient ID: Gloria Hodge, female    DOB: 1965-12-12,  MRN: [id number]  Chief Complaint  Patient presents with  . Routine Post Op    pov#3 dos 01.29.2019 Amputation 4th Rt  doing good    DOS: 11/11/17 Procedure: Amputation right fourth toe  52 y.o. female returns for post-op check.  Plans for surgery on the left second toe 12/23/2017.  States that she is feeling better from her flu last visit.  States the left second toe is looking much better.  Denies new issues  Objective:   General AA&O x3. Normal mood and affect.  Vascular Foot warm and well perfused.  Neurologic Gross sensation intact.  Dermatologic Right fourth toe amputation site healing well with slight maceration along the incision. Left hallux imitation site well-healed with thin scarring  Left second toe edema distally with 0.1 x 0.1 ulceration to the tip without probe to bone.  No erythema, no local warmth.  No drainage or purulence no ascending cellulitis.  Orthopedic: Tenderness to palpation noted about the left second toe    Assessment & Plan:  Patient was evaluated and treated and all questions answered.  S/p right fourth toe amputation -Progressing as expected post-operatively. -Sutures: removed.  -Dressed with betadine. Advised to dress daily. Still slightly macerated.  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  Advised that should patient be able to come to [facility] would be able to move the surgery up.  Patient states that she cannot get a ride to do so.  We will keep the current date unless clinical condition deteriorates. -Overall toe is looking better still edematous at the distal end ulceration appears stable.  No ascending cellulitis no signs for acute infection   Return in about 1 week (around 12/16/2017) for Wound check.

## 2017-12-16 ENCOUNTER — Ambulatory Visit (INDEPENDENT_AMBULATORY_CARE_PROVIDER_SITE_OTHER): Payer: 59 | Admitting: Podiatry

## 2017-12-16 DIAGNOSIS — M86172 Other acute osteomyelitis, left ankle and foot: Secondary | ICD-10-CM

## 2017-12-22 ENCOUNTER — Telehealth: Payer: Self-pay | Admitting: *Deleted

## 2017-12-22 NOTE — Telephone Encounter (Signed)
Have you found out anything about the patient's H&P.  We tried calling the patient and the doctor with no success.  We have the medical clearance.

## 2017-12-22 NOTE — Telephone Encounter (Signed)
I am calling to let you know the surgical center called and stated they had not received the history and physical form from your primary care doctor.  They said they have tried to call you several times.  You will not be able to have the surgery until they receive the form.  You can have them fax it to me.  "Okay, I will take care of it.  Can you call me back and leave me a message about where to send it?  I'm getting my taxes done right now."  I will call you back.  I called her back and left a message to get her doctor to send the history and physical form to [phone].

## 2017-12-22 NOTE — Telephone Encounter (Signed)
Melody faxed me the history and physical form.    I faxed it to Ms. Blanch Media at [facility].  I called and informed the patient that I received it and I faxed it to the surgical center.

## 2017-12-23 ENCOUNTER — Encounter: Payer: Self-pay | Admitting: *Deleted

## 2017-12-23 DIAGNOSIS — M86679 Other chronic osteomyelitis, unspecified ankle and foot: Secondary | ICD-10-CM | POA: Diagnosis not present

## 2017-12-23 NOTE — Progress Notes (Signed)
  Subjective:  Patient ID: Gloria Hodge, female    DOB: 1965-11-11,  MRN: [id number]  Chief Complaint  Patient presents with  . Routine Post Op    pov#4 dos 01.29.2019 Amputation 4th Rt   . Foot Ulcer    fu left 2nd toe ulcer    DOS: 11/11/17 Procedure: Amputation right fourth toe  52 y.o. female returns for post-op check.  States that the left second toe feels worse.  States that it hurts.  States that the swelling is worse.  Objective:   General AA&O x3. Normal mood and affect.  Vascular Foot warm and well perfused.  Neurologic Gross sensation intact.  Dermatologic Right fourth toe amputation site healing well with slight maceration along the incision. Left hallux imitation site well-healed with thin scarring  Left second toe still with edema.  No open ulceration.  Still local warmth.  Orthopedic: Tenderness to palpation noted about the left second toe    Assessment & Plan:  Patient was evaluated and treated and all questions answered.  S/p right fourth toe amputation -Well-healed without issues.  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 -Continue dressing with Betadine daily   Return for after surgery.

## 2017-12-25 ENCOUNTER — Encounter: Payer: Self-pay | Admitting: Podiatry

## 2017-12-26 ENCOUNTER — Telehealth: Payer: Self-pay | Admitting: *Deleted

## 2017-12-26 NOTE — Progress Notes (Signed)
Amputation Toe Interphalangeal 2nd Lt with Dr. March Rummage

## 2017-12-26 NOTE — Telephone Encounter (Signed)
Tried to reach out to patient again to see how she is doing after Surgery on Tuesday left message for her to call me back.  Patient called me back and stated that she is doing well and has no concerns.  Will see [place] for her first post op next week.

## 2017-12-30 ENCOUNTER — Ambulatory Visit (INDEPENDENT_AMBULATORY_CARE_PROVIDER_SITE_OTHER): Payer: 59 | Admitting: Podiatry

## 2017-12-30 DIAGNOSIS — Z9889 Other specified postprocedural states: Secondary | ICD-10-CM

## 2017-12-30 NOTE — Progress Notes (Signed)
  Subjective:  Patient ID: Gloria Hodge, female    DOB: Jun 23, 1966,  MRN: [id number]  Chief Complaint  Patient presents with  . Routine Post [facility] 03.12.2019 Amputation Toe Interphalangeal 2nd Lt PT. stated," it's doing pretty good, just a little pain; 2/10 achy pain." Tx: betadine    DOS: 12/23/17 Procedure: Partial amputation L 2nd toe  52 y.o. female returns for post-op check. Denies N/V/F/Ch. Pain is controlled with current medications.  Objective:   General AA&O x3. Normal mood and affect.  Vascular Foot warm and well perfused.  Neurologic Gross sensation intact.  Dermatologic Skin healing well without signs of infection. Skin edges well coapted without signs of infection.  Orthopedic: No tenderness to palpation noted about the surgical site.    Assessment & Plan:  Patient was evaluated and treated and all questions answered.  S/p partial amputation L 2nd toe -Progressing as expected post-operatively. -Sutures: intact. -Medications refilled: none -Foot redressed.  No Follow-up on file.

## 2018-01-06 ENCOUNTER — Ambulatory Visit (INDEPENDENT_AMBULATORY_CARE_PROVIDER_SITE_OTHER): Payer: 59 | Admitting: Podiatry

## 2018-01-06 DIAGNOSIS — Z9889 Other specified postprocedural states: Secondary | ICD-10-CM

## 2018-01-09 NOTE — Progress Notes (Signed)
  Subjective:  Patient ID: Gloria Hodge, female    DOB: 1966/07/29,  MRN: [id number]  Chief Complaint  Patient presents with  . Routine Post Op    pov#2 dos 03.12.2019 Amputation Toe Interphalangeal 2nd Lt Pt. stated," it's doing good, no pain at all." Tx: oxycodone and neosporin    DOS: 12/23/17 Procedure: Partial amputation L 2nd toe  52 y.o. female returns for post-op check. Denies N/V/F/Ch. Pain is controlled with current medications.  Objective:   General AA&O x3. Normal mood and affect.  Vascular Foot warm and well perfused.  Neurologic Gross sensation intact.  Dermatologic Skin healing well without signs of infection. Skin edges well coapted without signs of infection.  Orthopedic: No tenderness to palpation noted about the surgical site.    Assessment & Plan:  Patient was evaluated and treated and all questions answered.  S/p partial amputation L 2nd toe -Progressing as expected post-operatively. -Sutures: every other removed. -Medications refilled: none -Foot redressed.  Return in about 1 week (around 01/13/2018) for suture removal.

## 2018-01-13 ENCOUNTER — Encounter: Payer: 59 | Admitting: Podiatry

## 2018-01-19 ENCOUNTER — Encounter: Payer: Self-pay | Admitting: Podiatry

## 2018-01-19 ENCOUNTER — Ambulatory Visit (INDEPENDENT_AMBULATORY_CARE_PROVIDER_SITE_OTHER): Payer: 59 | Admitting: Podiatry

## 2018-01-19 DIAGNOSIS — S90411A Abrasion, right great toe, initial encounter: Secondary | ICD-10-CM

## 2018-01-19 NOTE — Progress Notes (Signed)
  Subjective:  Patient ID: Gloria Hodge, female    DOB: 08-23-1966,  MRN: [id number]  Chief Complaint  Patient presents with  . Routine Post Op    DOS 12/23/17 Amputatio toe interphalangeal 2nd left PT. stated," everything seems to be doing okay, but once a while 2/10 intermittent pain."  Tx: antibiotic     DOS: 12/23/17 Procedure: Partial amputation L 2nd toe  52 y.o. female returns for post-op check.  Denies pain.  States that everything seems to be doing okay.  Reports occasional 2 out of 10 intermittent pain.  Reports recent abrasion to the right great toe that she is putting antibiotic cream on denies pain.  Unsure how it started.  Objective:   General AA&O x3. Normal mood and affect.  Vascular Foot warm and well perfused.  Neurologic Gross sensation intact.  Dermatologic Left second toe amputation site healed.  Residual suture noted Right great toe abrasion without signs of infection  Orthopedic: No tenderness to palpation noted about the surgical site. Hx of L hallux, partial 2nd toe, R 4th toe amputation.   Assessment & Plan:  Patient was evaluated and treated and all questions answered.  S/p partial amputation L 2nd toe; history of L great toe amputation. -Progressing as expected post-operatively. -Sutures: remaining suture removed. -Will make appointment for toe filler L great toe.  Return in about 1 month (around 02/16/2018) for Post-op.

## 2018-01-19 NOTE — Addendum Note (Signed)
Addended by: Hardie Pulley on: 01/19/2018 06:11 PM   Modules accepted: Level of Service

## 2018-02-17 ENCOUNTER — Ambulatory Visit (INDEPENDENT_AMBULATORY_CARE_PROVIDER_SITE_OTHER): Payer: 59 | Admitting: Podiatry

## 2018-02-17 DIAGNOSIS — S90411D Abrasion, right great toe, subsequent encounter: Secondary | ICD-10-CM

## 2018-02-17 NOTE — Progress Notes (Signed)
  Subjective:  Patient ID: Gloria Hodge, female    DOB: March 27, 1966,  MRN: [id number]  Chief Complaint  Patient presents with  . Routine Post Op    Pt. stated," with the amp it's doing pretty good, no concerns." Tx: antibiotic (completed x 2 wks)  . Abrasion    Pt. stated," toenail looks worse." Tx: betadine    DOS: 12/23/17 Procedure: Partial amputation L 2nd toe  52 y.o. female returns for post-op check.  States her toes where she had surgery are doing fine without issue. Has been applying abx cream to the R great toe.  Objective:   General AA&O x3. Normal mood and affect.  Vascular Foot warm and well perfused.  Neurologic Gross sensation intact.  Dermatologic Left second toe amputation site healed.  Residual suture noted Right great toe abrasion healing well. No signs of infection  Orthopedic: No tenderness to palpation noted about the surgical site. Hx of L hallux, partial 2nd toe, R 4th toe amputation.   Assessment & Plan:  Patient was evaluated and treated and all questions answered.  S/p partial amputation L 2nd toe; history of L great toe amputation. -All healed without issue  R Great Toe Abrasion -Healing well. -Continue Epsom soak and neosporin  No follow-ups on file.

## 2018-02-26 ENCOUNTER — Ambulatory Visit (INDEPENDENT_AMBULATORY_CARE_PROVIDER_SITE_OTHER): Payer: 59 | Admitting: *Deleted

## 2018-02-26 DIAGNOSIS — L97501 Non-pressure chronic ulcer of other part of unspecified foot limited to breakdown of skin: Principal | ICD-10-CM

## 2018-02-26 DIAGNOSIS — E11621 Type 2 diabetes mellitus with foot ulcer: Secondary | ICD-10-CM

## 2018-03-17 ENCOUNTER — Encounter: Payer: 59 | Admitting: Podiatry

## 2018-03-24 ENCOUNTER — Encounter: Payer: 59 | Admitting: Podiatry

## 2018-04-06 ENCOUNTER — Ambulatory Visit (INDEPENDENT_AMBULATORY_CARE_PROVIDER_SITE_OTHER): Payer: 59 | Admitting: Podiatry

## 2018-04-06 ENCOUNTER — Encounter: Payer: Self-pay | Admitting: Podiatry

## 2018-04-06 VITALS — BP 133/77 | HR 87 | Temp 98.5°F | Resp 12

## 2018-04-06 DIAGNOSIS — E1142 Type 2 diabetes mellitus with diabetic polyneuropathy: Secondary | ICD-10-CM

## 2018-04-06 DIAGNOSIS — S90411D Abrasion, right great toe, subsequent encounter: Secondary | ICD-10-CM

## 2018-04-06 NOTE — Progress Notes (Signed)
  Subjective:  Patient ID: Gloria Hodge, female    DOB: 1966/01/31,  MRN: [id number]  Chief Complaint  Patient presents with  . Routine Post Op    DOS 12/23/17 Partial amp L 2nd toe Pt. stated," it seems like it's doing good." Tx: oxycodone -Pt. denies N/V/ch?f    DOS: 12/23/17 Procedure: Partial amputation L 2nd toe  52 y.o. female returns for post-op check.  Doing well.  Denies any postop issues.  Objective:   General AA&O x3. Normal mood and affect.  Vascular Foot warm and well perfused.  Neurologic Gross sensation intact.  Dermatologic Left second toe amputation site healed.  Residual suture noted Right great toe abrasion healing well. No signs of infection  Orthopedic: No tenderness to palpation noted about the surgical site. Hx of L hallux, partial 2nd toe, R 4th toe amputation.   Assessment & Plan:  Patient was evaluated and treated and all questions answered.  S/p partial amputation L 2nd toe; history of L great toe amputation. -All healed without issue  R Great Toe Abrasion -Healing well minimal callus debrided today  Follow-up in 9 weeks for routine foot care due to the amputation history  Return in about 2 months (around 06/07/2018).

## 2018-06-03 ENCOUNTER — Encounter: Payer: Self-pay | Admitting: *Deleted

## 2018-06-08 ENCOUNTER — Encounter: Payer: 59 | Admitting: Podiatry

## 2018-06-22 ENCOUNTER — Ambulatory Visit (INDEPENDENT_AMBULATORY_CARE_PROVIDER_SITE_OTHER): Payer: 59 | Admitting: Podiatry

## 2018-06-22 DIAGNOSIS — B351 Tinea unguium: Secondary | ICD-10-CM

## 2018-06-22 DIAGNOSIS — L84 Corns and callosities: Secondary | ICD-10-CM

## 2018-06-22 DIAGNOSIS — E1142 Type 2 diabetes mellitus with diabetic polyneuropathy: Secondary | ICD-10-CM

## 2018-06-22 NOTE — Progress Notes (Signed)
Subjective:  Patient ID: Gloria Hodge, female    DOB: [birth year],  MRN: [id number]  Chief Complaint  Patient presents with  . Routine Post Op    Pt.stated," it's doing good, no complaints." -pt denies N/V/F/Ch  . shoes    PUDS -shoes were tight     52 y.o. female presents  for diabetic foot care. All her amputation sites are well healed. Denies issues.  Review of Systems: Negative except as noted in the HPI. Denies N/V/F/Ch.  Past Medical History:  Diagnosis Date  . Calf pain   . Depression   . Diabetes ([place])   . Dizziness   . Fatigue   . Frequent headaches   . IBS (irritable bowel syndrome)   . Memory loss   . Muscle pain   . Swelling     Current Outpatient Medications:  .  ALPRAZolam (XANAX) 1 MG tablet, Take 1 mg by mouth at bedtime as needed for anxiety., Disp: , Rfl:  .  cephALEXin (KEFLEX) 500 MG capsule, Take 1 capsule (500 mg total) by mouth 3 (three) times daily., Disp: 30 capsule, Rfl: 0 .  clindamycin (CLEOCIN) 150 MG capsule, Take 1 capsule (150 mg total) by mouth 3 (three) times daily., Disp: 42 capsule, Rfl: 0 .  cyclobenzaprine (FLEXERIL) 10 MG tablet, Take 10 mg by mouth 3 (three) times daily as needed for muscle spasms. Patient doesn't know the mg/lc, Disp: , Rfl:  .  Dapagliflozin Propanediol (FARXIGA PO), Take by mouth., Disp: , Rfl:  .  metFORMIN (GLUCOPHAGE) 1000 MG tablet, Take 1,000 mg by mouth 2 (two) times daily with a meal., Disp: , Rfl:  .  oxyCODONE-acetaminophen (PERCOCET) 10-325 MG tablet, Take 1 tablet by mouth every 4 (four) hours as needed for pain., Disp: 10 tablet, Rfl: 0 .  promethazine (PHENERGAN) 12.5 MG tablet, Take 12.5 mg by mouth every 6 (six) hours as needed for nausea or vomiting. Patient doesn't know the mg/lc, Disp: , Rfl:  .  rosuvastatin (CRESTOR) 40 MG tablet, Take 40 mg by mouth daily., Disp: , Rfl:  .  zolpidem (AMBIEN) 10 MG tablet, Take 10 mg by mouth at bedtime as needed for sleep., Disp: , Rfl:   Social History    Tobacco Use  Smoking Status Never Smoker  Smokeless Tobacco Never Used    Allergies  Allergen Reactions  . Bactrim [Sulfamethoxazole-Trimethoprim]    Objective:  There were no vitals filed for this visit. There is no height or weight on file to calculate BMI. Constitutional Well developed. Well nourished.  Vascular Dorsalis pedis pulses present 1+ bilaterally  Posterior tibial pulses present 1+ bilaterally  Pedal hair growth diminished. Capillary refill normal to all digits.  No cyanosis or clubbing noted.  Neurologic Normal speech. Oriented to person, place, and time. Epicritic sensation to light touch grossly present bilaterally. Protective sensation with 5.07 monofilament  absent bilaterally.  Dermatologic Nails to the right 2nd/3rd toes elongated and dystrophic Pre-ulcerative callus R hallux and 5th toe No open wounds. No skin lesions.  Orthopedic: Normal joint ROM without pain or crepitus bilaterally. No visible deformities. No bony tenderness. History of R 4th toe amputation History of L hallux and partial second toe amputation.   Assessment:   1. DM type 2 with diabetic peripheral neuropathy ([place])   2. Onychomycosis   3. Callus of foot    Plan:  Patient was evaluated and treated and all questions answered.  Diabetes with Amputation Hx, DPN, Onychomycosis -Educated on diabetic footcare. Diabetic risk level  3 -Nails x10 debrided sharply and manually with large nail nipper and rotary burr.   Procedure: Nail Debridement Rationale: Patient meets criteria for routine foot care due to amputation history. Type of Debridement: manual, sharp debridement. Instrumentation: Nail nipper, rotary burr. Number of Nails: 2   Procedure: Paring of Lesion Rationale: painful hyperkeratotic lesion Type of Debridement: manual, sharp debridement. Instrumentation: 312 blade Number of Lesions: 2   Return in about 2 months (around 08/22/2018) for Diabetic Foot Care.

## 2018-06-24 ENCOUNTER — Other Ambulatory Visit: Payer: Self-pay | Admitting: Neurosurgery

## 2018-06-24 DIAGNOSIS — M5126 Other intervertebral disc displacement, lumbar region: Secondary | ICD-10-CM

## 2018-07-08 ENCOUNTER — Inpatient Hospital Stay
Admission: RE | Admit: 2018-07-08 | Discharge: 2018-07-08 | Disposition: A | Discharge status: Home / Self Care | Payer: POS | Source: Ambulatory Visit | Attending: Neurosurgery | Admitting: Neurosurgery

## 2018-07-16 ENCOUNTER — Ambulatory Visit (INDEPENDENT_AMBULATORY_CARE_PROVIDER_SITE_OTHER): Payer: 59

## 2018-07-16 ENCOUNTER — Ambulatory Visit (INDEPENDENT_AMBULATORY_CARE_PROVIDER_SITE_OTHER): Payer: 59 | Admitting: *Deleted

## 2018-07-16 ENCOUNTER — Encounter: Payer: Self-pay | Admitting: Sports Medicine

## 2018-07-16 ENCOUNTER — Ambulatory Visit (INDEPENDENT_AMBULATORY_CARE_PROVIDER_SITE_OTHER): Payer: 59 | Admitting: Sports Medicine

## 2018-07-16 VITALS — BP 148/86 | HR 99 | Temp 99.4°F | Resp 16

## 2018-07-16 DIAGNOSIS — E1142 Type 2 diabetes mellitus with diabetic polyneuropathy: Secondary | ICD-10-CM

## 2018-07-16 DIAGNOSIS — Z89419 Acquired absence of unspecified great toe: Secondary | ICD-10-CM | POA: Diagnosis not present

## 2018-07-16 DIAGNOSIS — M79671 Pain in right foot: Secondary | ICD-10-CM

## 2018-07-16 DIAGNOSIS — S90811A Abrasion, right foot, initial encounter: Secondary | ICD-10-CM | POA: Diagnosis not present

## 2018-07-16 DIAGNOSIS — M2041 Other hammer toe(s) (acquired), right foot: Secondary | ICD-10-CM

## 2018-07-16 DIAGNOSIS — L84 Corns and callosities: Secondary | ICD-10-CM

## 2018-07-16 NOTE — Progress Notes (Signed)
Subjective: Gloria Hodge is a 52 y.o. female patient seen in office for evaluation of ulceration of the right foot reports that on yesterday dropped a coffee cup on the foot but prior to dropping a coffee cup she had already had a wound there that she had been treating with cleaning with peroxide and Neosporin.  Patient states that she is concerned because she wants to check to see if the toe is broken and admits that she does have some pain 6 out of 10 otherwise on oxycodone as given by another provider and was given doxycycline on Monday by her primary care doctor. Patient has a history of diabetes and a blood glucose level  Monday of 237 mg/dl.  Last A1c 11.8 does admit an episode of fever but now resolved denies nausea/vomiting/chills/night sweats/shortness of breath/pain. Patient has no other pedal complaints at this time.  There are no active problems to display for this patient.  Current Outpatient Medications on File Prior to Visit  Medication Sig Dispense Refill  . ALPRAZolam (XANAX) 1 MG tablet Take 1 mg by mouth at bedtime as needed for anxiety.    . cephALEXin (KEFLEX) 500 MG capsule Take 1 capsule (500 mg total) by mouth 3 (three) times daily. 30 capsule 0  . clindamycin (CLEOCIN) 150 MG capsule Take 1 capsule (150 mg total) by mouth 3 (three) times daily. 42 capsule 0  . cyclobenzaprine (FLEXERIL) 10 MG tablet Take 10 mg by mouth 3 (three) times daily as needed for muscle spasms. Patient doesn't know the mg/lc    . Dapagliflozin Propanediol (FARXIGA PO) Take by mouth.    . metFORMIN (GLUCOPHAGE) 1000 MG tablet Take 1,000 mg by mouth 2 (two) times daily with a meal.    . oxyCODONE-acetaminophen (PERCOCET) 10-325 MG tablet Take 1 tablet by mouth every 4 (four) hours as needed for pain. 10 tablet 0  . promethazine (PHENERGAN) 12.5 MG tablet Take 12.5 mg by mouth every 6 (six) hours as needed for nausea or vomiting. Patient doesn't know the mg/lc    . rosuvastatin (CRESTOR) 40 MG  tablet Take 40 mg by mouth daily.    Marland Kitchen zolpidem (AMBIEN) 10 MG tablet Take 10 mg by mouth at bedtime as needed for sleep.     No current facility-administered medications on file prior to visit.    Allergies  Allergen Reactions  . Bactrim [Sulfamethoxazole-Trimethoprim]     No results found for this or any previous visit (from the past 2160 hour(s)).  Objective: There were no vitals filed for this visit.  General: Patient is awake, alert, oriented x 3 and in no acute distress.  Dermatology: Skin is warm and dry bilateral with a partial thickness ulceration present  Right foot over the dorsum of the second toe at the base that measures 0.3 x 0.5 x 0.1 cm with a fibro-granular base there is mild faint blanchable erythema without warmth no active drainage no malodor no significant swelling to the toe or cellulitis extending up the foot and leg this likely represents an abrasion that has been worsened since dropping the coffee cup on her foot.  No acute signs of infection.   Vascular: Dorsalis Pedis pulse = 1/4 Bilateral,  Posterior Tibial pulse = 1/4 Bilateral,  Capillary Fill Time < 5 seconds  Neurologic: Protective sensation absent bilateral.  Musculosketal: There is no reproducible pain but subjectively patient states that she has pain.  Patient is status post right fourth toe amputation left hallux and partial second toe amputation.  Xrays, right foot reveal amputation status of fourth toe at the area of concern at the second toe there is no acute fracture or bony erosion or any acute signs of infection in this area.  There is mild soft tissue swelling no other acute findings.  No results for input(s): GRAMSTAIN, LABORGA in the last 8760 hours.  Assessment and Plan:  Problem List Items Addressed This Visit    None    Visit Diagnoses    Right foot pain    -  Primary   Relevant Orders   DG Foot Complete Right   Abrasion, right foot, initial encounter       DM type 2 with  diabetic peripheral neuropathy ([place])          -Examined patient and discussed the progression of the wound and treatment alternatives. -Xrays reviewed -Cleanse ulceration with saline moistened gauze to healthy bleeding -Applied antibiotic cream and dry sterile dressing and instructed patient to continue with daily dressings at home consisting of the same or if she runs out may use Betadine -Advised patient to refrain from using peroxide on her foot -Advised patient to continue with doxycycline of which she has from previous provider -May take Tylenol for fever and advised close monitoring if continued constitutional symptoms to return to office or report to ER immediately -Patient to return to office in 2 weeks for follow up care and evaluation or sooner if problems arise.  Landis Martins, DPM

## 2018-07-16 NOTE — Patient Instructions (Signed)

## 2018-07-21 ENCOUNTER — Ambulatory Visit (INDEPENDENT_AMBULATORY_CARE_PROVIDER_SITE_OTHER): Payer: 59 | Admitting: Podiatry

## 2018-07-21 ENCOUNTER — Encounter: Payer: Self-pay | Admitting: Podiatry

## 2018-07-21 ENCOUNTER — Other Ambulatory Visit: Payer: Self-pay | Admitting: Sports Medicine

## 2018-07-21 VITALS — BP 143/84 | HR 83 | Temp 98.2°F | Resp 16

## 2018-07-21 DIAGNOSIS — E1142 Type 2 diabetes mellitus with diabetic polyneuropathy: Secondary | ICD-10-CM

## 2018-07-21 DIAGNOSIS — S90811D Abrasion, right foot, subsequent encounter: Secondary | ICD-10-CM | POA: Diagnosis not present

## 2018-07-21 DIAGNOSIS — S91301D Unspecified open wound, right foot, subsequent encounter: Secondary | ICD-10-CM | POA: Diagnosis not present

## 2018-07-21 DIAGNOSIS — S90811A Abrasion, right foot, initial encounter: Secondary | ICD-10-CM

## 2018-07-21 DIAGNOSIS — M79671 Pain in right foot: Secondary | ICD-10-CM

## 2018-07-21 NOTE — Progress Notes (Signed)
Subjective:  Patient ID: Gloria Hodge, female    DOB: 30-Nov-1965,  MRN: [id number]  Chief Complaint  Patient presents with  . Foot Ulcer    Pt. stated," I feel like it's getting deeper and it still hurts; 6/10 achy constant pain." Tx: betadine, bandaid, and doxy -pt denies drainage/swelling/redness/N/v/f/Ch -fbs: 214 A1C: 11.8 PCP: [facility] x 1 mo    52 y.o. female presents for wound care. Last seen by Dr. Cannon Kettle 10/3. Presents after new injury with wound to the right foot. Above history confirmed with patient.  Review of Systems: Negative except as noted in the HPI. Denies N/V/F/Ch.  Past Medical History:  Diagnosis Date  . Calf pain   . Depression   . Diabetes ([place])   . Dizziness   . Fatigue   . Frequent headaches   . IBS (irritable bowel syndrome)   . Memory loss   . Muscle pain   . Swelling     Current Outpatient Medications:  .  ALPRAZolam (XANAX) 1 MG tablet, Take 1 mg by mouth at bedtime as needed for anxiety., Disp: , Rfl:  .  cephALEXin (KEFLEX) 500 MG capsule, Take 1 capsule (500 mg total) by mouth 3 (three) times daily., Disp: 30 capsule, Rfl: 0 .  clindamycin (CLEOCIN) 150 MG capsule, Take 1 capsule (150 mg total) by mouth 3 (three) times daily., Disp: 42 capsule, Rfl: 0 .  cyclobenzaprine (FLEXERIL) 10 MG tablet, Take 10 mg by mouth 3 (three) times daily as needed for muscle spasms. Patient doesn't know the mg/lc, Disp: , Rfl:  .  Dapagliflozin Propanediol (FARXIGA PO), Take by mouth., Disp: , Rfl:  .  metFORMIN (GLUCOPHAGE) 1000 MG tablet, Take 1,000 mg by mouth 2 (two) times daily with a meal., Disp: , Rfl:  .  oxyCODONE-acetaminophen (PERCOCET) 10-325 MG tablet, Take 1 tablet by mouth every 4 (four) hours as needed for pain., Disp: 10 tablet, Rfl: 0 .  promethazine (PHENERGAN) 12.5 MG tablet, Take 12.5 mg by mouth every 6 (six) hours as needed for nausea or vomiting. Patient doesn't know the mg/lc, Disp: , Rfl:  .  rosuvastatin (CRESTOR) 40 MG tablet, Take 40  mg by mouth daily., Disp: , Rfl:  .  zolpidem (AMBIEN) 10 MG tablet, Take 10 mg by mouth at bedtime as needed for sleep., Disp: , Rfl:   Social History   Tobacco Use  Smoking Status Never Smoker  Smokeless Tobacco Never Used    Allergies  Allergen Reactions  . Bactrim [Sulfamethoxazole-Trimethoprim]    Objective:   Vitals:   07/21/18 1318  BP: (!) 143/84  Pulse: 83  Resp: 16  Temp: 98.2 F (36.8 C)   There is no height or weight on file to calculate BMI. Constitutional Well developed. Well nourished.  Vascular Dorsalis pedis pulses palpable bilaterally. Posterior tibial pulses palpable bilaterally. Capillary refill normal to all digits.  No cyanosis or clubbing noted. Pedal hair growth normal.  Neurologic Normal speech. Oriented to person, place, and time. Protective sensation absent  Dermatologic Wound Location: right dorsal 2nd MPJ Wound Base: Granular/Healthy Peri-wound: Normal Exudate: None: wound tissue dry  Orthopedic: No pain to palpation either foot.   Radiographs: None today. Assessment:   1. Abrasion, right foot, subsequent encounter   2. Open wound of right foot, subsequent encounter    Plan:  Patient was evaluated and treated and all questions answered.  Ulcer R 2nd MPJ -No debridement today. -No signs of acute infection noted today. -Dressed with iodosorb, DSD. -Will get XR  at next visit. -Continue Doxy to completion.  Return in about 1 week (around 07/28/2018) for Wound Care, Right 2nd toe; with XR.

## 2018-07-28 ENCOUNTER — Encounter: Payer: Self-pay | Admitting: Podiatry

## 2018-07-28 ENCOUNTER — Ambulatory Visit (INDEPENDENT_AMBULATORY_CARE_PROVIDER_SITE_OTHER): Payer: 59 | Admitting: Podiatry

## 2018-07-28 ENCOUNTER — Ambulatory Visit (INDEPENDENT_AMBULATORY_CARE_PROVIDER_SITE_OTHER): Payer: 59

## 2018-07-28 VITALS — BP 153/83 | HR 78 | Temp 99.2°F | Resp 16

## 2018-07-28 DIAGNOSIS — S90811D Abrasion, right foot, subsequent encounter: Secondary | ICD-10-CM

## 2018-07-28 DIAGNOSIS — M79671 Pain in right foot: Secondary | ICD-10-CM | POA: Diagnosis not present

## 2018-07-28 DIAGNOSIS — S91301D Unspecified open wound, right foot, subsequent encounter: Secondary | ICD-10-CM

## 2018-07-28 NOTE — Progress Notes (Signed)
Subjective:  Patient ID: Gloria Hodge, female    DOB: 06-11-1966,  MRN: [id number]  Chief Complaint  Patient presents with  . Foot Ulcer    F?U R 2nd toe Pt. stated," it looke like it's getting better, but it kin of throbbs; 3/10." Tx: doxy and iodosorb -pt denies drainage/swelling/redness/N/V/F/Ch -FBS: 185 A1C: 11.3 PCP: [facility] x 1 mo    52 y.o. female presents for wound care. Above history confirmed with patient.  Review of Systems: Negative except as noted in the HPI. Denies N/V/F/Ch.  Past Medical History:  Diagnosis Date  . Calf pain   . Depression   . Diabetes ([place])   . Dizziness   . Fatigue   . Frequent headaches   . IBS (irritable bowel syndrome)   . Memory loss   . Muscle pain   . Swelling     Current Outpatient Medications:  .  ALPRAZolam (XANAX) 1 MG tablet, Take 1 mg by mouth at bedtime as needed for anxiety., Disp: , Rfl:  .  cephALEXin (KEFLEX) 500 MG capsule, Take 1 capsule (500 mg total) by mouth 3 (three) times daily., Disp: 30 capsule, Rfl: 0 .  clindamycin (CLEOCIN) 150 MG capsule, Take 1 capsule (150 mg total) by mouth 3 (three) times daily., Disp: 42 capsule, Rfl: 0 .  cyclobenzaprine (FLEXERIL) 10 MG tablet, Take 10 mg by mouth 3 (three) times daily as needed for muscle spasms. Patient doesn't know the mg/lc, Disp: , Rfl:  .  Dapagliflozin Propanediol (FARXIGA PO), Take by mouth., Disp: , Rfl:  .  LANTUS SOLOSTAR 100 UNIT/ML Solostar Pen, INJECT 50 UNITS AT BEDTIME, Disp: , Rfl: 2 .  metFORMIN (GLUCOPHAGE) 1000 MG tablet, Take 1,000 mg by mouth 2 (two) times daily with a meal., Disp: , Rfl:  .  oxyCODONE-acetaminophen (PERCOCET) 10-325 MG tablet, Take 1 tablet by mouth every 4 (four) hours as needed for pain., Disp: 10 tablet, Rfl: 0 .  promethazine (PHENERGAN) 12.5 MG tablet, Take 12.5 mg by mouth every 6 (six) hours as needed for nausea or vomiting. Patient doesn't know the mg/lc, Disp: , Rfl:  .  rosuvastatin (CRESTOR) 40 MG tablet, Take 40 mg by  mouth daily., Disp: , Rfl:  .  zolpidem (AMBIEN) 10 MG tablet, Take 10 mg by mouth at bedtime as needed for sleep., Disp: , Rfl:   Social History   Tobacco Use  Smoking Status Never Smoker  Smokeless Tobacco Never Used    Allergies  Allergen Reactions  . Bactrim [Sulfamethoxazole-Trimethoprim]    Objective:   Vitals:   07/28/18 1323  BP: (!) 153/83  Pulse: 78  Resp: 16  Temp: 99.2 F (37.3 C)   There is no height or weight on file to calculate BMI. Constitutional Well developed. Well nourished.  Vascular Dorsalis pedis pulses palpable bilaterally. Posterior tibial pulses palpable bilaterally. Capillary refill normal to all digits.  No cyanosis or clubbing noted. Pedal hair growth normal.  Neurologic Normal speech. Oriented to person, place, and time. Protective sensation absent  Dermatologic Wound Location: right dorsal 2nd MPJ Wound Base: Granular/Healthy Peri-wound: Normal Exudate: None: wound tissue dry 0.3x0.3 superficial linear wound.  Orthopedic: No pain to palpation either foot.   Radiographs: None today. Assessment:   1. Abrasion, right foot, subsequent encounter   2. Open wound of right foot, subsequent encounter   3. Right foot pain    Plan:  Patient was evaluated and treated and all questions answered.  Wound R 2nd MPJ -Wound closure performed as below. -  Dressed with abx ointment, DSD.  Procedure: Wound closure Anesthesia: Lidocaine 1% plain; 37mL and Marcaine 0.5% plain; 2mL Instrumentation: 4-0 prolene Technique: Following anesthesia and sterile skin prep, the wound was closed with 4-0 prolene.  Dressing: Dry, sterile, compression dressing. Disposition: Patient tolerated procedure well. Patient to return in 1 week for follow-up.   Return in about 2 weeks (around 08/11/2018) for Wound Care, Right.

## 2018-08-11 ENCOUNTER — Ambulatory Visit (INDEPENDENT_AMBULATORY_CARE_PROVIDER_SITE_OTHER): Payer: 59 | Admitting: Podiatry

## 2018-08-11 ENCOUNTER — Encounter: Payer: Self-pay | Admitting: Podiatry

## 2018-08-11 VITALS — BP 142/91 | HR 93 | Temp 98.1°F | Resp 16

## 2018-08-11 DIAGNOSIS — S91301D Unspecified open wound, right foot, subsequent encounter: Secondary | ICD-10-CM

## 2018-08-11 DIAGNOSIS — S90811D Abrasion, right foot, subsequent encounter: Secondary | ICD-10-CM

## 2018-08-11 NOTE — Progress Notes (Signed)
Subjective:  Patient ID: Gloria Hodge, female    DOB: [birth year],  MRN: [id number]  Chief Complaint  Patient presents with  . Wound Check    F/U R$ 2nd toe Pt. states," 2nd toe is getting better, but my big toe and the little toes are not better, they still drain some (clear-bloody). Alsi, the big toe itches at times and I start to pick at it." Tx: iodosorb and neopsorin - pt denies N/V/F/CH -FBS: 215 x Sat A1C: 11.3 PCP: Haque x 2 mo    52 y.o. female presents for wound care. Above history confirmed with patient.  Review of Systems: Negative except as noted in the HPI. Denies N/V/F/Ch.  Past Medical History:  Diagnosis Date  . Calf pain   . Depression   . Diabetes ([place])   . Dizziness   . Fatigue   . Frequent headaches   . IBS (irritable bowel syndrome)   . Memory loss   . Muscle pain   . Swelling     Current Outpatient Medications:  .  ALPRAZolam (XANAX) 1 MG tablet, Take 1 mg by mouth at bedtime as needed for anxiety., Disp: , Rfl:  .  cephALEXin (KEFLEX) 500 MG capsule, Take 1 capsule (500 mg total) by mouth 3 (three) times daily., Disp: 30 capsule, Rfl: 0 .  clindamycin (CLEOCIN) 150 MG capsule, Take 1 capsule (150 mg total) by mouth 3 (three) times daily., Disp: 42 capsule, Rfl: 0 .  cyclobenzaprine (FLEXERIL) 10 MG tablet, Take 10 mg by mouth 3 (three) times daily as needed for muscle spasms. Patient doesn't know the mg/lc, Disp: , Rfl:  .  Dapagliflozin Propanediol (FARXIGA PO), Take by mouth., Disp: , Rfl:  .  LANTUS SOLOSTAR 100 UNIT/ML Solostar Pen, INJECT 50 UNITS AT BEDTIME, Disp: , Rfl: 2 .  metFORMIN (GLUCOPHAGE) 1000 MG tablet, Take 1,000 mg by mouth 2 (two) times daily with a meal., Disp: , Rfl:  .  oxyCODONE-acetaminophen (PERCOCET) 10-325 MG tablet, Take 1 tablet by mouth every 4 (four) hours as needed for pain., Disp: 10 tablet, Rfl: 0 .  promethazine (PHENERGAN) 12.5 MG tablet, Take 12.5 mg by mouth every 6 (six) hours as needed for nausea or vomiting.  Patient doesn't know the mg/lc, Disp: , Rfl:  .  rosuvastatin (CRESTOR) 40 MG tablet, Take 40 mg by mouth daily., Disp: , Rfl:  .  zolpidem (AMBIEN) 10 MG tablet, Take 10 mg by mouth at bedtime as needed for sleep., Disp: , Rfl:   Social History   Tobacco Use  Smoking Status Never Smoker  Smokeless Tobacco Never Used    Allergies  Allergen Reactions  . Bactrim [Sulfamethoxazole-Trimethoprim]    Objective:   Vitals:   08/11/18 1335  BP: (!) 142/91  Pulse: 93  Resp: 16  Temp: 98.1 F (36.7 C)   There is no height or weight on file to calculate BMI. Constitutional Well developed. Well nourished.  Vascular Dorsalis pedis pulses palpable bilaterally. Posterior tibial pulses palpable bilaterally. Capillary refill normal to all digits.  No cyanosis or clubbing noted. Pedal hair growth normal.  Neurologic Normal speech. Oriented to person, place, and time. Protective sensation absent  Dermatologic Wound Location: right dorsal 2nd MPJ Wound Base: Granular/Healthy Peri-wound: Normal Exudate: None: wound tissue dry Wound healed R 2nd MPJ Residual abrasion R hallux, 5th toe.  Orthopedic: No pain to palpation either foot.   Radiographs: None today. Assessment:   1. Abrasion, right foot, subsequent encounter   2. Open wound of right  foot, subsequent encounter    Plan:  Patient was evaluated and treated and all questions answered.  Wound R 2nd MPJ -Wound healed. Patient scratched her sutures away  Abrasion R hallux, 5th toe -Dressed with medihoney and DSD. -Advised not to scratch at the area  Return in about 4 weeks (around 09/08/2018) for Wound Care, Bilateral; abrasion f/u.

## 2018-08-13 NOTE — Progress Notes (Signed)
Patient ID: Gloria Hodge, female   DOB: May 27, 1966, 52 y.o.   MRN: [id number]    Patient presents for diabetic shoe pick up, shoes are tried on for good fit.  Patient received 1 pair OrthoFeet Women's Coral - [place] - 987 in 8 medium with 1 L5000 with great toe filler on left and 3 custom molded diabetic inserts right .  Verbal and written break in and wear instructions given.

## 2018-08-24 ENCOUNTER — Ambulatory Visit: Payer: 59 | Admitting: Podiatry

## 2018-08-28 NOTE — Progress Notes (Signed)
Patient ID: Gloria Hodge, female   DOB: 10-Sep-1966, 52 y.o.   MRN: [id number]   Patient presents at Dr Price's request to be measured for diabetic shoes and inserts with toe filler on the left with [facility] Certified Pedorthist.  Patient will be called when shoes and inserts arrive to schedule a fitting.

## 2018-09-08 ENCOUNTER — Ambulatory Visit: Payer: 59 | Admitting: Podiatry

## 2018-09-14 ENCOUNTER — Ambulatory Visit (INDEPENDENT_AMBULATORY_CARE_PROVIDER_SITE_OTHER): Payer: 59 | Admitting: Podiatry

## 2018-09-14 ENCOUNTER — Encounter: Payer: Self-pay | Admitting: Podiatry

## 2018-09-14 ENCOUNTER — Ambulatory Visit (INDEPENDENT_AMBULATORY_CARE_PROVIDER_SITE_OTHER): Payer: 59

## 2018-09-14 VITALS — BP 146/81 | HR 78 | Temp 96.8°F | Resp 16

## 2018-09-14 DIAGNOSIS — S9031XA Contusion of right foot, initial encounter: Secondary | ICD-10-CM | POA: Diagnosis not present

## 2018-09-14 DIAGNOSIS — S90811D Abrasion, right foot, subsequent encounter: Secondary | ICD-10-CM | POA: Diagnosis not present

## 2018-09-14 DIAGNOSIS — E118 Type 2 diabetes mellitus with unspecified complications: Secondary | ICD-10-CM

## 2018-09-14 DIAGNOSIS — E1142 Type 2 diabetes mellitus with diabetic polyneuropathy: Secondary | ICD-10-CM

## 2018-09-14 DIAGNOSIS — B351 Tinea unguium: Secondary | ICD-10-CM | POA: Diagnosis not present

## 2018-09-14 DIAGNOSIS — Z9641 Presence of insulin pump (external) (internal): Secondary | ICD-10-CM | POA: Diagnosis not present

## 2018-09-14 DIAGNOSIS — S9031XD Contusion of right foot, subsequent encounter: Secondary | ICD-10-CM | POA: Diagnosis not present

## 2018-10-02 ENCOUNTER — Other Ambulatory Visit: Payer: Self-pay | Admitting: Podiatry

## 2018-10-02 DIAGNOSIS — Z9641 Presence of insulin pump (external) (internal): Secondary | ICD-10-CM

## 2018-10-02 DIAGNOSIS — E118 Type 2 diabetes mellitus with unspecified complications: Secondary | ICD-10-CM

## 2018-10-02 DIAGNOSIS — S9031XA Contusion of right foot, initial encounter: Secondary | ICD-10-CM

## 2018-10-11 NOTE — Progress Notes (Signed)
Subjective:  Patient ID: Gloria Hodge, female    DOB: [birth year],  MRN: [id number]  Chief Complaint  Patient presents with  . Abrasion    F/U R 2nd, hallux and 5th toe Pt. states," the big toes looks raw and it feels sore, it feels like it has a bunch of tape around it." Tx: neosporin -pt deneis drainage/redness/swelling -w/ N and chills x1 wk   52 y.o. female presents for wound care. Above history confirmed with patient.  Review of Systems: Negative except as noted in the HPI. Denies N/V/F/Ch.  Past Medical History:  Diagnosis Date  . Calf pain   . Depression   . Diabetes ([place])   . Dizziness   . Fatigue   . Frequent headaches   . IBS (irritable bowel syndrome)   . Memory loss   . Muscle pain   . Swelling     Current Outpatient Medications:  .  ALPRAZolam (XANAX) 1 MG tablet, Take 1 mg by mouth at bedtime as needed for anxiety., Disp: , Rfl:  .  cephALEXin (KEFLEX) 500 MG capsule, Take 1 capsule (500 mg total) by mouth 3 (three) times daily., Disp: 30 capsule, Rfl: 0 .  clindamycin (CLEOCIN) 150 MG capsule, Take 1 capsule (150 mg total) by mouth 3 (three) times daily., Disp: 42 capsule, Rfl: 0 .  cyclobenzaprine (FLEXERIL) 10 MG tablet, Take 10 mg by mouth 3 (three) times daily as needed for muscle spasms. Patient doesn't know the mg/lc, Disp: , Rfl:  .  Dapagliflozin Propanediol (FARXIGA PO), Take by mouth., Disp: , Rfl:  .  LANTUS SOLOSTAR 100 UNIT/ML Solostar Pen, INJECT 50 UNITS AT BEDTIME, Disp: , Rfl: 2 .  metFORMIN (GLUCOPHAGE) 1000 MG tablet, Take 1,000 mg by mouth 2 (two) times daily with a meal., Disp: , Rfl:  .  oxyCODONE-acetaminophen (PERCOCET) 10-325 MG tablet, Take 1 tablet by mouth every 4 (four) hours as needed for pain., Disp: 10 tablet, Rfl: 0 .  promethazine (PHENERGAN) 12.5 MG tablet, Take 12.5 mg by mouth every 6 (six) hours as needed for nausea or vomiting. Patient doesn't know the mg/lc, Disp: , Rfl:  .  rosuvastatin (CRESTOR) 40 MG tablet, Take 40  mg by mouth daily., Disp: , Rfl:  .  zolpidem (AMBIEN) 10 MG tablet, Take 10 mg by mouth at bedtime as needed for sleep., Disp: , Rfl:   Social History   Tobacco Use  Smoking Status Never Smoker  Smokeless Tobacco Never Used    Allergies  Allergen Reactions  . Bactrim [Sulfamethoxazole-Trimethoprim]    Objective:   Vitals:   09/14/18 1327  BP: (!) 146/81  Pulse: 78  Resp: 16  Temp: (!) 96.8 F (36 C)   There is no height or weight on file to calculate BMI. Constitutional Well developed. Well nourished.  Vascular Dorsalis pedis pulses palpable bilaterally. Posterior tibial pulses palpable bilaterally. Capillary refill normal to all digits.  No cyanosis or clubbing noted. Pedal hair growth normal.  Neurologic Normal speech. Oriented to person, place, and time. Protective sensation absent  Dermatologic  healed wounds right second MPJ, right hallux, right fifth toe Thickened and dystrophic  Orthopedic: No pain to palpation either foot.   Radiographs: None today. Assessment:   1. Contusion of right foot, subsequent encounter   2. Abrasion, right foot, subsequent encounter   3. DM type 2 with diabetic peripheral neuropathy ([place])   4. Onychomycosis    Plan:  Patient was evaluated and treated and all questions answered.  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 Instrumentation: Nail nipper, rotary burr. Number of Nails: 7  Return in about 10 weeks (around 11/23/2018) for Diabetic Foot Care.

## 2018-10-29 ENCOUNTER — Telehealth: Payer: Self-pay | Admitting: *Deleted

## 2018-10-29 MED ORDER — CIPROFLOXACIN HCL 250 MG PO TABS: 250.0000 mg | ORAL_TABLET | Freq: Two times a day (BID) | ORAL | 0 refills | Status: DC

## 2018-10-29 MED ORDER — CLINDAMYCIN HCL 150 MG PO CAPS: 150.0000 mg | ORAL_CAPSULE | Freq: Three times a day (TID) | ORAL | 0 refills | Status: DC

## 2018-10-29 NOTE — Telephone Encounter (Signed)
Patient states that her toe is badly infected she made and appointment to see Dr March Rummage for Monday but wanted to know if an antibiotic could be called in before then.  Please advise.

## 2018-10-30 NOTE — Telephone Encounter (Signed)
I called pt, she states the left 3rd toe is blistered not swollen as previously and she has cut the loose skin. I informed pt of the antibiotic order and offer appt today in [place] with Dr. March Rummage. Pt states she will get the antibiotic, but can not come to [place].

## 2018-11-02 ENCOUNTER — Ambulatory Visit (INDEPENDENT_AMBULATORY_CARE_PROVIDER_SITE_OTHER): Payer: 59

## 2018-11-02 ENCOUNTER — Ambulatory Visit (INDEPENDENT_AMBULATORY_CARE_PROVIDER_SITE_OTHER): Payer: 59 | Admitting: Podiatry

## 2018-11-02 ENCOUNTER — Encounter: Payer: Self-pay | Admitting: Podiatry

## 2018-11-02 ENCOUNTER — Other Ambulatory Visit: Payer: Self-pay

## 2018-11-02 VITALS — BP 140/82 | HR 102 | Temp 98.4°F | Resp 16

## 2018-11-02 DIAGNOSIS — L97521 Non-pressure chronic ulcer of other part of left foot limited to breakdown of skin: Secondary | ICD-10-CM

## 2018-11-02 DIAGNOSIS — S90415A Abrasion, left lesser toe(s), initial encounter: Secondary | ICD-10-CM

## 2018-11-02 DIAGNOSIS — M79672 Pain in left foot: Secondary | ICD-10-CM | POA: Diagnosis not present

## 2018-11-02 DIAGNOSIS — E11621 Type 2 diabetes mellitus with foot ulcer: Secondary | ICD-10-CM

## 2018-11-02 DIAGNOSIS — S91205A Unspecified open wound of left lesser toe(s) with damage to nail, initial encounter: Secondary | ICD-10-CM

## 2018-11-02 NOTE — Progress Notes (Signed)
Subjective:  Patient ID: Gloria Hodge, female    DOB: 04-02-1966,  MRN: [id number]  Chief Complaint  Patient presents with  . Foot Problem    L 3rd toe red, swollen, painful and w/ clear draiange x 1 mo; 8/10 pressure pain Tx; abx and bandaid, and betadine -w/ N&V since yesterday  -FBS: 156 A1C: 72    53 y.o. female presents for wound care. Unsure how it started. Has been taking the Abx that were called in for her. Unsure how she lost the toenail to that toe.  Review of Systems: Negative except as noted in the HPI.   Past Medical History:  Diagnosis Date  . Calf pain   . Depression   . Diabetes ([place])   . Dizziness   . Fatigue   . Frequent headaches   . IBS (irritable bowel syndrome)   . Memory loss   . Muscle pain   . Swelling     Current Outpatient Medications:  .  ALPRAZolam (XANAX) 1 MG tablet, Take 1 mg by mouth at bedtime as needed for anxiety., Disp: , Rfl:  .  cephALEXin (KEFLEX) 500 MG capsule, Take 1 capsule (500 mg total) by mouth 3 (three) times daily., Disp: 30 capsule, Rfl: 0 .  ciprofloxacin (CIPRO) 250 MG tablet, Take 1 tablet (250 mg total) by mouth 2 (two) times daily., Disp: 14 tablet, Rfl: 0 .  clindamycin (CLEOCIN) 150 MG capsule, Take 1 capsule (150 mg total) by mouth 3 (three) times daily., Disp: 21 capsule, Rfl: 0 .  cyclobenzaprine (FLEXERIL) 10 MG tablet, Take 10 mg by mouth 3 (three) times daily as needed for muscle spasms. Patient doesn't know the mg/lc, Disp: , Rfl:  .  Dapagliflozin Propanediol (FARXIGA PO), Take by mouth., Disp: , Rfl:  .  fenofibrate (TRICOR) 145 MG tablet, , Disp: , Rfl:  .  furosemide (LASIX) 20 MG tablet, , Disp: , Rfl:  .  LANTUS SOLOSTAR 100 UNIT/ML Solostar Pen, INJECT 50 UNITS AT BEDTIME, Disp: , Rfl: 2 .  levofloxacin (LEVAQUIN) 750 MG tablet, , Disp: , Rfl:  .  metFORMIN (GLUCOPHAGE) 1000 MG tablet, Take 1,000 mg by mouth 2 (two) times daily with a meal., Disp: , Rfl:  .  oxyCODONE (ROXICODONE) 15 MG immediate  release tablet, , Disp: , Rfl:  .  oxyCODONE-acetaminophen (PERCOCET) 10-325 MG tablet, Take 1 tablet by mouth every 4 (four) hours as needed for pain., Disp: 10 tablet, Rfl: 0 .  promethazine (PHENERGAN) 12.5 MG tablet, Take 12.5 mg by mouth every 6 (six) hours as needed for nausea or vomiting. Patient doesn't know the mg/lc, Disp: , Rfl:  .  rosuvastatin (CRESTOR) 40 MG tablet, Take 40 mg by mouth daily., Disp: , Rfl:  .  zolpidem (AMBIEN) 10 MG tablet, Take 10 mg by mouth at bedtime as needed for sleep., Disp: , Rfl:   Social History   Tobacco Use  Smoking Status Never Smoker  Smokeless Tobacco Never Used    Allergies  Allergen Reactions  . Bactrim [Sulfamethoxazole-Trimethoprim]    Objective:   Vitals:   11/02/18 1408  BP: 140/82  Pulse: (!) 102  Resp: 16  Temp: 98.4 F (36.9 C)   There is no height or weight on file to calculate BMI. Constitutional Well developed. Well nourished.  Vascular Dorsalis pedis pulses palpable bilaterally. Posterior tibial pulses palpable bilaterally. Capillary refill normal to all digits.  No cyanosis or clubbing noted. Pedal hair growth normal.  Neurologic Normal speech. Oriented to person, place,  and time. Protective sensation absent  Dermatologic Wound Location: L 3rd toe dorsal distal Wound Base: Granular/Healthy Peri-wound: Calloused Exudate: None: wound tissue dry Wound Measurements: -0.5x0.5  Orthopedic: No pain to palpation either foot. L hallux amputation noted L 2nd toe partial amputation noted R 4th toe amputation note   Radiographs: Taken and reviewed no definite osseous erosions. Assessment:   1. Diabetic ulcer of toe of left foot associated with type 2 diabetes mellitus, limited to breakdown of skin ([place])   2. Left foot pain   3. Abrasion of third toe of left foot, initial encounter   4. Traumatic loss of toenail of lesser toe of left foot, initial encounter    Plan:  Patient was evaluated and treated and all  questions answered.  Ulcer left 3rd toe, nail bed injury, unclear cause -XR as above. No signs of OM today. Will repeat in 2 weeks. -Minimal non-procedural debridement today -N/V could be abx related, toe does not look clinically infected today. -Continue abx to completion -Dressed with medihoney and band-aid. -Off-loading with surgical shoe.  Return in about 1 week (around 11/09/2018) for Wound Care, Left 3rd toe .

## 2018-11-03 ENCOUNTER — Other Ambulatory Visit: Payer: Self-pay | Admitting: Podiatry

## 2018-11-03 DIAGNOSIS — M79672 Pain in left foot: Secondary | ICD-10-CM

## 2018-11-03 DIAGNOSIS — E11621 Type 2 diabetes mellitus with foot ulcer: Secondary | ICD-10-CM

## 2018-11-03 DIAGNOSIS — L97521 Non-pressure chronic ulcer of other part of left foot limited to breakdown of skin: Principal | ICD-10-CM

## 2018-11-03 DIAGNOSIS — S90415A Abrasion, left lesser toe(s), initial encounter: Secondary | ICD-10-CM

## 2018-11-09 ENCOUNTER — Ambulatory Visit (INDEPENDENT_AMBULATORY_CARE_PROVIDER_SITE_OTHER): Payer: 59 | Admitting: Podiatry

## 2018-11-09 ENCOUNTER — Encounter: Payer: Self-pay | Admitting: Podiatry

## 2018-11-09 VITALS — BP 135/75 | HR 86 | Temp 98.3°F | Resp 16

## 2018-11-09 DIAGNOSIS — M79672 Pain in left foot: Secondary | ICD-10-CM

## 2018-11-09 DIAGNOSIS — E11621 Type 2 diabetes mellitus with foot ulcer: Secondary | ICD-10-CM | POA: Diagnosis not present

## 2018-11-09 DIAGNOSIS — L97521 Non-pressure chronic ulcer of other part of left foot limited to breakdown of skin: Secondary | ICD-10-CM

## 2018-11-09 NOTE — Progress Notes (Signed)
Subjective:  Patient ID: Gloria Hodge, female    DOB: August 27, 1966,  MRN: [id number]  Chief Complaint  Patient presents with  . Foot Ulcer    F/U L 3rd toe ulcer PT. states," seems the same to me; 8/10 constant pain." -w/ burning, redness and clear drainage -pt deneis N/V/F/Ch  . Diabetes    FBS: 191 A1C: 61    53 y.o. female presents for wound care.  History as above confirmed with patient.  Could not find her surgical shoe though she states that she has 3 at home.  Review of Systems: Negative except as noted in the HPI.   Past Medical History:  Diagnosis Date  . Calf pain   . Depression   . Diabetes ([place])   . Dizziness   . Fatigue   . Frequent headaches   . IBS (irritable bowel syndrome)   . Memory loss   . Muscle pain   . Swelling     Current Outpatient Medications:  .  ALPRAZolam (XANAX) 1 MG tablet, Take 1 mg by mouth at bedtime as needed for anxiety., Disp: , Rfl:  .  cephALEXin (KEFLEX) 500 MG capsule, Take 1 capsule (500 mg total) by mouth 3 (three) times daily., Disp: 30 capsule, Rfl: 0 .  ciprofloxacin (CIPRO) 250 MG tablet, Take 1 tablet (250 mg total) by mouth 2 (two) times daily., Disp: 14 tablet, Rfl: 0 .  clindamycin (CLEOCIN) 150 MG capsule, Take 1 capsule (150 mg total) by mouth 3 (three) times daily., Disp: 21 capsule, Rfl: 0 .  cyclobenzaprine (FLEXERIL) 10 MG tablet, Take 10 mg by mouth 3 (three) times daily as needed for muscle spasms. Patient doesn't know the mg/lc, Disp: , Rfl:  .  Dapagliflozin Propanediol (FARXIGA PO), Take by mouth., Disp: , Rfl:  .  fenofibrate (TRICOR) 145 MG tablet, , Disp: , Rfl:  .  furosemide (LASIX) 20 MG tablet, , Disp: , Rfl:  .  LANTUS SOLOSTAR 100 UNIT/ML Solostar Pen, INJECT 50 UNITS AT BEDTIME, Disp: , Rfl: 2 .  levofloxacin (LEVAQUIN) 750 MG tablet, , Disp: , Rfl:  .  metFORMIN (GLUCOPHAGE) 1000 MG tablet, Take 1,000 mg by mouth 2 (two) times daily with a meal., Disp: , Rfl:  .  oxyCODONE (ROXICODONE) 15 MG  immediate release tablet, , Disp: , Rfl:  .  oxyCODONE-acetaminophen (PERCOCET) 10-325 MG tablet, Take 1 tablet by mouth every 4 (four) hours as needed for pain., Disp: 10 tablet, Rfl: 0 .  promethazine (PHENERGAN) 12.5 MG tablet, Take 12.5 mg by mouth every 6 (six) hours as needed for nausea or vomiting. Patient doesn't know the mg/lc, Disp: , Rfl:  .  rosuvastatin (CRESTOR) 40 MG tablet, Take 40 mg by mouth daily., Disp: , Rfl:  .  zolpidem (AMBIEN) 10 MG tablet, Take 10 mg by mouth at bedtime as needed for sleep., Disp: , Rfl:   Social History   Tobacco Use  Smoking Status Never Smoker  Smokeless Tobacco Never Used    Allergies  Allergen Reactions  . Bactrim [Sulfamethoxazole-Trimethoprim]    Objective:   Vitals:   11/09/18 1532  BP: 135/75  Pulse: 86  Resp: 16  Temp: 98.3 F (36.8 C)   There is no height or weight on file to calculate BMI. Constitutional Well developed. Well nourished.  Vascular Dorsalis pedis pulses palpable bilaterally. Posterior tibial pulses palpable bilaterally. Capillary refill normal to all digits.  No cyanosis or clubbing noted. Pedal hair growth normal.  Neurologic Normal speech. Oriented to person,  place, and time. Protective sensation absent  Dermatologic Wound Location: L 3rd toe dorsal distal Wound Base: Granular/Healthy Peri-wound: Calloused Exudate: None: wound tissue dry Wound Measurements: -0.5x0.5 No warmth erythema signs of acute infection  Orthopedic: No pain to palpation either foot. 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 Assessment:   1. Diabetic ulcer of toe of left foot associated with type 2 diabetes mellitus, limited to breakdown of skin ([place])   2. Left foot pain    Plan:  Patient was evaluated and treated and all questions answered.  Ulcer left 3rd toe, nail bed injury, unclear cause -No warmth erythema signs of infection today appears  to be clinically improving. -Has completed abx no need for refill. -Dressed with silvadene and band-aid. -Off-loading with surgical shoe.  Repeat XR next visit  No follow-ups on file.

## 2018-11-16 ENCOUNTER — Ambulatory Visit (INDEPENDENT_AMBULATORY_CARE_PROVIDER_SITE_OTHER): Payer: 59

## 2018-11-16 ENCOUNTER — Ambulatory Visit (INDEPENDENT_AMBULATORY_CARE_PROVIDER_SITE_OTHER): Payer: 59 | Admitting: Podiatry

## 2018-11-16 ENCOUNTER — Encounter: Payer: Self-pay | Admitting: Podiatry

## 2018-11-16 VITALS — BP 151/84 | HR 106 | Temp 100.5°F | Resp 16

## 2018-11-16 DIAGNOSIS — M86172 Other acute osteomyelitis, left ankle and foot: Secondary | ICD-10-CM

## 2018-11-16 DIAGNOSIS — M8580 Other specified disorders of bone density and structure, unspecified site: Secondary | ICD-10-CM | POA: Diagnosis not present

## 2018-11-16 DIAGNOSIS — S9031XD Contusion of right foot, subsequent encounter: Secondary | ICD-10-CM

## 2018-11-16 DIAGNOSIS — S91205A Unspecified open wound of left lesser toe(s) with damage to nail, initial encounter: Secondary | ICD-10-CM

## 2018-11-16 DIAGNOSIS — L97511 Non-pressure chronic ulcer of other part of right foot limited to breakdown of skin: Secondary | ICD-10-CM | POA: Diagnosis not present

## 2018-11-16 DIAGNOSIS — E11621 Type 2 diabetes mellitus with foot ulcer: Secondary | ICD-10-CM

## 2018-11-16 DIAGNOSIS — L97524 Non-pressure chronic ulcer of other part of left foot with necrosis of bone: Secondary | ICD-10-CM

## 2018-11-16 DIAGNOSIS — S90121D Contusion of right lesser toe(s) without damage to nail, subsequent encounter: Secondary | ICD-10-CM

## 2018-11-16 DIAGNOSIS — M79671 Pain in right foot: Secondary | ICD-10-CM

## 2018-11-16 MED ORDER — CIPROFLOXACIN HCL 250 MG PO TABS: 250.0000 mg | ORAL_TABLET | Freq: Two times a day (BID) | ORAL | 0 refills | Status: DC

## 2018-11-16 NOTE — Addendum Note (Signed)
Addended by: Allean Found on: 11/16/2018 03:13 PM   Modules accepted: Orders

## 2018-11-16 NOTE — Progress Notes (Addendum)
Subjective:  Patient ID: Gloria Hodge, female    DOB: 09-Jun-1966,  MRN: [id number]  Chief Complaint  Patient presents with  . Ulcer    F?U L 3rd toe ulcer PT.s tates," don't look like it's getting any better; 8/10 constant burning." Tx: abx cream -w/ bloody drainage,redness,sweling and toe discoloration -w/ chills -FBS: 245 A1C: 56    53 y.o. female presents for wound care.  States that the wound is not getting any better.  Patient reports chills today slightly febrile today at 100.5.  Also complains of continued right foot pain injured it a couple weeks ago still having pain and swelling to the foot.  Review of Systems: Negative except as noted in the HPI.   Past Medical History:  Diagnosis Date  . Calf pain   . Depression   . Diabetes ([place])   . Dizziness   . Fatigue   . Frequent headaches   . IBS (irritable bowel syndrome)   . Memory loss   . Muscle pain   . Swelling     Current Outpatient Medications:  .  ALPRAZolam (XANAX) 1 MG tablet, Take 1 mg by mouth at bedtime as needed for anxiety., Disp: , Rfl:  .  cephALEXin (KEFLEX) 500 MG capsule, Take 1 capsule (500 mg total) by mouth 3 (three) times daily., Disp: 30 capsule, Rfl: 0 .  ciprofloxacin (CIPRO) 250 MG tablet, Take 1 tablet (250 mg total) by mouth 2 (two) times daily., Disp: 14 tablet, Rfl: 0 .  clindamycin (CLEOCIN) 150 MG capsule, Take 1 capsule (150 mg total) by mouth 3 (three) times daily., Disp: 21 capsule, Rfl: 0 .  cyclobenzaprine (FLEXERIL) 10 MG tablet, Take 10 mg by mouth 3 (three) times daily as needed for muscle spasms. Patient doesn't know the mg/lc, Disp: , Rfl:  .  Dapagliflozin Propanediol (FARXIGA PO), Take by mouth., Disp: , Rfl:  .  fenofibrate (TRICOR) 145 MG tablet, , Disp: , Rfl:  .  furosemide (LASIX) 20 MG tablet, , Disp: , Rfl:  .  LANTUS SOLOSTAR 100 UNIT/ML Solostar Pen, INJECT 50 UNITS AT BEDTIME, Disp: , Rfl: 2 .  levofloxacin (LEVAQUIN) 750 MG tablet, , Disp: , Rfl:  .  metFORMIN  (GLUCOPHAGE) 1000 MG tablet, Take 1,000 mg by mouth 2 (two) times daily with a meal., Disp: , Rfl:  .  oxyCODONE (ROXICODONE) 15 MG immediate release tablet, , Disp: , Rfl:  .  oxyCODONE-acetaminophen (PERCOCET) 10-325 MG tablet, Take 1 tablet by mouth every 4 (four) hours as needed for pain., Disp: 10 tablet, Rfl: 0 .  promethazine (PHENERGAN) 12.5 MG tablet, Take 12.5 mg by mouth every 6 (six) hours as needed for nausea or vomiting. Patient doesn't know the mg/lc, Disp: , Rfl:  .  rosuvastatin (CRESTOR) 40 MG tablet, Take 40 mg by mouth daily., Disp: , Rfl:  .  zolpidem (AMBIEN) 10 MG tablet, Take 10 mg by mouth at bedtime as needed for sleep., Disp: , Rfl:   Social History   Tobacco Use  Smoking Status Never Smoker  Smokeless Tobacco Never Used    Allergies  Allergen Reactions  . Bactrim [Sulfamethoxazole-Trimethoprim]    Objective:   Vitals:   11/16/18 1114  BP: (!) 151/84  Pulse: (!) 106  Resp: 16  Temp: (!) 100.5 F (38.1 C)   There is no height or weight on file to calculate BMI. Constitutional Well developed. Well nourished.  Vascular Dorsalis pedis pulses palpable bilaterally. Posterior tibial pulses palpable bilaterally. Capillary refill normal to  all digits.  No cyanosis or clubbing noted. Pedal hair growth normal.  Neurologic Normal speech. Oriented to person, place, and time. Protective sensation absent  Dermatologic Wound Location: L 3rd toe dorsal distal Wound Base: Granular/Healthy Peri-wound: Calloused Exudate: None: Serosanguineous drainage noted Wound Measurements: -0.5x0.5 Local warmth no erythema no ascending cellulitis no fluctuance no crepitus  Orthopedic: No pain to palpation either foot. L hallux amputation noted L 2nd toe partial amputation noted R 4th toe amputation noted Pain to palpation about the dorsal midfoot right with warmth swelling   Radiographs: Left third toe distal ostial lysis, no acute fractures dislocations right  foot Assessment:   1. Diabetic ulcer of toe of left foot associated with type 2 diabetes mellitus, with necrosis of bone ([place])   2. Traumatic loss of toenail of lesser toe of left foot, initial encounter   3. Acute osteomyelitis of left ankle or foot ([place])   4. Bone erosion determined by x-ray   5. Contusion of right foot including toes, subsequent encounter   6. Right foot pain    Plan:  Patient was evaluated and treated and all questions answered.  Ulcer left 3rd toe, nail bed injury, now with signs of OM -XR taken show erosion of the distal aspect of the digit -I discussed the patient that due to the bone erosion she would benefit from amputation of the digit.  We will plan for the procedure to be performed tomorrow pending OR availability -Patient has failed all conservative therapy and wishes to proceed with surgical intervention. All risks, benefits, and alternatives discussed with patient. No guarantees given. Consent reviewed and signed by patient. -Planned procedures: Partial vs total Left 3rd toe amputation  R Foot Contusion -Repeat XR without signs of osseous injury -Will continue to monitor -Concern for possible Charcot process  30 minutes of face to face time were spent with the patient. >50% of this was spent on counseling and coordination of care. Specifically discussed with patient the above diagnoses and overall treatment plan.  Plan for surgery tomorrow.  No follow-ups on file.

## 2018-11-16 NOTE — Patient Instructions (Addendum)
Pre-Operative Instructions  Congratulations, you have decided to take an important step towards improving your quality of life.  You can be assured that the doctors and staff at [facility] will be with you every step of the way.  Here are some important things you should know:  1. Plan to be at the surgery center/hospital at least 1 (one) hour prior to your scheduled time, unless otherwise directed by the surgical center/hospital staff.  You must have a responsible adult accompany you, remain during the surgery and drive you home.  Make sure you have directions to the surgical center/hospital to ensure you arrive on time. 2. If you are having surgery at [facility] or [facility], you will need a copy of your medical history and physical form from your family physician within one month prior to the date of surgery. We will give you a form for your primary physician to complete.  3. We make every effort to accommodate the date you request for surgery.  However, there are times where surgery dates or times have to be moved.  We will contact you as soon as possible if a change in schedule is required.   4. No aspirin/ibuprofen for one week before surgery.  If you are on aspirin, any non-steroidal anti-inflammatory medications (Mobic, Aleve, Ibuprofen) should not be taken seven (7) days prior to your surgery.  You make take Tylenol for pain prior to surgery.  5. Medications - If you are taking daily heart and blood pressure medications, seizure, reflux, allergy, asthma, anxiety, pain or diabetes medications, make sure you notify the surgery center/hospital before the day of surgery so they can tell you which medications you should take or avoid the day of surgery. 6. No food or drink after midnight the night before surgery unless directed otherwise by surgical center/hospital staff. 7. No alcoholic beverages 24-hours prior to surgery.  No smoking 24-hours prior or 24-hours after  surgery. 8. Wear loose pants or shorts. They should be loose enough to fit over bandages, boots, and casts. 9. Don't wear slip-on shoes. Sneakers are preferred. 10. Bring your boot with you to the surgery center/hospital.  Also bring crutches or a walker if your physician has prescribed it for you.  If you do not have this equipment, it will be provided for you after surgery. 11. If you have not been contacted by the surgery center/hospital by the day before your surgery, call to confirm the date and time of your surgery. 12. Leave-time from work may vary depending on the type of surgery you have.  Appropriate arrangements should be made prior to surgery with your employer. 13. Prescriptions will be provided immediately following surgery by your doctor.  Fill these as soon as possible after surgery and take the medication as directed. Pain medications will not be refilled on weekends and must be approved by the doctor. 14. Remove nail polish on the operative foot and avoid getting pedicures prior to surgery. 15. Wash the night before surgery.  The night before surgery wash the foot and leg well with water and the antibacterial soap provided. Be sure to pay special attention to beneath the toenails and in between the toes.  Wash for at least three (3) minutes. Rinse thoroughly with water and dry well with a towel.  Perform this wash unless told not to do so by your physician.  Enclosed: 1 Ice pack (please put in freezer the night before surgery)   1 Hibiclens skin cleaner     Pre-op instructions  If you have any questions regarding the instructions, please do not hesitate to call our office.  [place]: [address], [place], [place] [address] -- [phone]  [place]: [address] [place], [place] [address] -- [phone]  [place]: [address]  [place], [place] [address] -- [phone]  [place]: [address], [address], [place], [place] [address] -- [phone]  Website:  [email or web address] 

## 2018-11-17 ENCOUNTER — Other Ambulatory Visit: Payer: Self-pay | Admitting: Podiatry

## 2018-11-17 DIAGNOSIS — E11621 Type 2 diabetes mellitus with foot ulcer: Secondary | ICD-10-CM

## 2018-11-17 DIAGNOSIS — L97524 Non-pressure chronic ulcer of other part of left foot with necrosis of bone: Secondary | ICD-10-CM

## 2018-11-17 MED ORDER — CLINDAMYCIN HCL 300 MG PO CAPS: 300.0000 mg | ORAL_CAPSULE | Freq: Two times a day (BID) | ORAL | 0 refills | Status: DC

## 2018-11-17 MED ORDER — OXYCODONE-ACETAMINOPHEN 10-325 MG PO TABS: 1.0000 | ORAL_TABLET | ORAL | 0 refills | Status: DC | PRN

## 2018-11-17 NOTE — Progress Notes (Signed)
Rx sent to pharmacy for outpatient surgery. °

## 2018-11-19 ENCOUNTER — Telehealth: Payer: Self-pay | Admitting: *Deleted

## 2018-11-19 ENCOUNTER — Other Ambulatory Visit: Payer: Self-pay | Admitting: Podiatry

## 2018-11-19 DIAGNOSIS — E08621 Diabetes mellitus due to underlying condition with foot ulcer: Secondary | ICD-10-CM

## 2018-11-19 DIAGNOSIS — M8580 Other specified disorders of bone density and structure, unspecified site: Secondary | ICD-10-CM

## 2018-11-19 DIAGNOSIS — L97511 Non-pressure chronic ulcer of other part of right foot limited to breakdown of skin: Secondary | ICD-10-CM

## 2018-11-19 DIAGNOSIS — M79671 Pain in right foot: Secondary | ICD-10-CM

## 2018-11-19 NOTE — Telephone Encounter (Signed)
Pt called, states she had a partial left 3rd toe amputation 11/17/2018, and how the right 1st toe is red, swollen the size of a kiwi and painful with swelling over the entire foot and up the leg. I told pt I recommended she go to the ED now. Pt asked if she could go tomorrow, because it is raining. I told pt I recommended she go to the ED today. Pt states she doesn't have anyone to take her and she took her pain medication. I told pt to call someone to take her she could not drive on the narcotic. Pt hung up the phone.

## 2018-11-19 NOTE — Telephone Encounter (Signed)
Called patient and advised she go to the ED. Should they call will discuss plan of care. Likely will need MRI. Will likely benefit from admission for IV Abx.

## 2018-11-21 NOTE — Progress Notes (Signed)
Underwent surgery outpatient at [facility].  DOS: 11/17/2018 Procedure: L 3rd toe Amputation

## 2018-11-23 ENCOUNTER — Encounter: Payer: Self-pay | Admitting: Podiatry

## 2018-11-23 ENCOUNTER — Ambulatory Visit (INDEPENDENT_AMBULATORY_CARE_PROVIDER_SITE_OTHER): Payer: 59 | Admitting: Podiatry

## 2018-11-23 ENCOUNTER — Ambulatory Visit (INDEPENDENT_AMBULATORY_CARE_PROVIDER_SITE_OTHER): Payer: 59

## 2018-11-23 ENCOUNTER — Encounter: Payer: Self-pay | Admitting: Internal Medicine

## 2018-11-23 VITALS — BP 142/69 | HR 96 | Temp 100.9°F | Resp 16

## 2018-11-23 DIAGNOSIS — M86172 Other acute osteomyelitis, left ankle and foot: Secondary | ICD-10-CM | POA: Diagnosis not present

## 2018-11-23 DIAGNOSIS — M86671 Other chronic osteomyelitis, right ankle and foot: Secondary | ICD-10-CM

## 2018-11-23 DIAGNOSIS — L03115 Cellulitis of right lower limb: Secondary | ICD-10-CM | POA: Diagnosis not present

## 2018-11-23 DIAGNOSIS — L02611 Cutaneous abscess of right foot: Secondary | ICD-10-CM

## 2018-11-23 DIAGNOSIS — L97524 Non-pressure chronic ulcer of other part of left foot with necrosis of bone: Secondary | ICD-10-CM

## 2018-11-23 DIAGNOSIS — L03031 Cellulitis of right toe: Secondary | ICD-10-CM

## 2018-11-23 DIAGNOSIS — E11621 Type 2 diabetes mellitus with foot ulcer: Secondary | ICD-10-CM

## 2018-11-23 DIAGNOSIS — E1169 Type 2 diabetes mellitus with other specified complication: Secondary | ICD-10-CM

## 2018-11-23 DIAGNOSIS — L02612 Cutaneous abscess of left foot: Secondary | ICD-10-CM

## 2018-11-23 DIAGNOSIS — M79672 Pain in left foot: Secondary | ICD-10-CM | POA: Diagnosis not present

## 2018-11-23 DIAGNOSIS — L97514 Non-pressure chronic ulcer of other part of right foot with necrosis of bone: Secondary | ICD-10-CM

## 2018-11-23 DIAGNOSIS — Z9889 Other specified postprocedural states: Secondary | ICD-10-CM

## 2018-11-23 DIAGNOSIS — F329 Major depressive disorder, single episode, unspecified: Secondary | ICD-10-CM

## 2018-11-23 DIAGNOSIS — M869 Osteomyelitis, unspecified: Secondary | ICD-10-CM

## 2018-11-23 DIAGNOSIS — M8580 Other specified disorders of bone density and structure, unspecified site: Secondary | ICD-10-CM

## 2018-11-23 DIAGNOSIS — L03032 Cellulitis of left toe: Secondary | ICD-10-CM

## 2018-11-23 NOTE — Progress Notes (Signed)
Subjective:  Patient ID: Gloria Hodge, female    DOB: Mar 25, 1966,  MRN: [id number]  Chief Complaint  Patient presents with  . Foot Problem    R hallux swelling, redness, drainage, odor and pain radiating up to the leg x Wed; 10/10 shapr constant pain - no injury Tx;' abx cream Pt.s tats," seems like it busted open FBS: 102  . Routine Post Op    Pt. states," amputated toe is doing well." Tx: abx cream and pain meds -pt deneis any issues with amputated toe    53 y.o. female presents for wound care. States the amputation site is doing fine. Concerned about right foot and right great toe. States the wound looked worse since Friday. Has pain and swelling all the way up her leg. Reports N//V since Friday.  Review of Systems: Negative except as noted in the HPI.   Past Medical History:  Diagnosis Date  . Calf pain   . Depression   . Diabetes ([place])   . Dizziness   . Fatigue   . Frequent headaches   . IBS (irritable bowel syndrome)   . Memory loss   . Muscle pain   . Swelling     Current Outpatient Medications:  .  ALPRAZolam (XANAX) 1 MG tablet, Take 1 mg by mouth at bedtime as needed for anxiety., Disp: , Rfl:  .  cephALEXin (KEFLEX) 500 MG capsule, Take 1 capsule (500 mg total) by mouth 3 (three) times daily., Disp: 30 capsule, Rfl: 0 .  ciprofloxacin (CIPRO) 250 MG tablet, Take 1 tablet (250 mg total) by mouth 2 (two) times daily., Disp: 21 tablet, Rfl: 0 .  clindamycin (CLEOCIN) 300 MG capsule, Take 1 capsule (300 mg total) by mouth 2 (two) times daily., Disp: 14 capsule, Rfl: 0 .  cyclobenzaprine (FLEXERIL) 10 MG tablet, Take 10 mg by mouth 3 (three) times daily as needed for muscle spasms. Patient doesn't know the mg/lc, Disp: , Rfl:  .  Dapagliflozin Propanediol (FARXIGA PO), Take by mouth., Disp: , Rfl:  .  fenofibrate (TRICOR) 145 MG tablet, , Disp: , Rfl:  .  furosemide (LASIX) 20 MG tablet, , Disp: , Rfl:  .  LANTUS SOLOSTAR 100 UNIT/ML Solostar Pen, INJECT 50 UNITS AT  BEDTIME, Disp: , Rfl: 2 .  levofloxacin (LEVAQUIN) 750 MG tablet, , Disp: , Rfl:  .  metFORMIN (GLUCOPHAGE) 1000 MG tablet, Take 1,000 mg by mouth 2 (two) times daily with a meal., Disp: , Rfl:  .  oxyCODONE (ROXICODONE) 15 MG immediate release tablet, , Disp: , Rfl:  .  oxyCODONE-acetaminophen (PERCOCET) 10-325 MG tablet, Take 1 tablet by mouth every 4 (four) hours as needed for pain., Disp: 10 tablet, Rfl: 0 .  promethazine (PHENERGAN) 12.5 MG tablet, Take 12.5 mg by mouth every 6 (six) hours as needed for nausea or vomiting. Patient doesn't know the mg/lc, Disp: , Rfl:  .  rosuvastatin (CRESTOR) 40 MG tablet, Take 40 mg by mouth daily., Disp: , Rfl:  .  zolpidem (AMBIEN) 10 MG tablet, Take 10 mg by mouth at bedtime as needed for sleep., Disp: , Rfl:   Social History   Tobacco Use  Smoking Status Never Smoker  Smokeless Tobacco Never Used    Allergies  Allergen Reactions  . Bactrim [Sulfamethoxazole-Trimethoprim]    Objective:   Vitals:   11/23/18 1018  BP: (!) 142/69  Pulse: 96  Resp: 16  Temp: (!) 100.9 F (38.3 C)   There is no height or weight on file  to calculate BMI. Constitutional Well developed. Well nourished.  Vascular Dorsalis pedis pulses palpable bilaterally. Posterior tibial pulses palpable bilaterally. Capillary refill normal to all digits.  No cyanosis or clubbing noted. Pedal hair growth normal.  Neurologic Normal speech. Oriented to person, place, and time. Protective sensation absent  Dermatologic L 3rd toe amputation site healing well incision coapted. R hallux edematous, draining ulcer with exposed bone. Erythema, edema extending up the leg  Orthopedic: L hallux amputation noted L 2nd toe partial amputation noted L 3rd toe partial amputation noted. R 4th toe amputation noted Pain on palpation Right great toe.   Radiographs: Edema and osteolysis right great toe. No SQ air. Assessment:   1. Post-operative state   2. Diabetic ulcer of toe of  left foot associated with type 2 diabetes mellitus, with necrosis of bone ([place])   3. Acute osteomyelitis of left ankle or foot ([place])   4. Left foot pain   5. Ulcer of great toe, right, with necrosis of bone ([facility])   6. Cellulitis and abscess of toe of right foot   7. Cellulitis of right leg   8. Bone erosion determined by x-ray    Plan:  Patient was evaluated and treated and all questions answered.  S/p L 3rd toe amputation IPJ -Doing well, incision coapted.  -Foot redressed  Right great toe ulceration with osteomyelitis -Patient toxic appearing, febrile. -X-rays as above consistent with ostial lysis and osteomyelitis -Arrange for direct admit to the hospital for IV antibiotics and surgery today. -Had a sip of water at 8 AM -Discussed the patient that she would benefit from amputation of the toe due to severe osteomyelitis.  Patient verbalized understanding. -We will continue to follow the patient in the hospital -Right foot dressed with Betadine wet-to-dry dressing  25 minutes of face to face time were spent with the patient. >50% of this was spent on counseling and coordination of care. Specifically discussed with patient the plan for her right great toe and that she need to be admitted to the hospital.  Arranged for direct admission with hospitalist excepting patient.  No follow-ups on file.

## 2018-11-24 ENCOUNTER — Other Ambulatory Visit: Payer: Self-pay | Admitting: Podiatry

## 2018-11-24 DIAGNOSIS — L02611 Cutaneous abscess of right foot: Secondary | ICD-10-CM

## 2018-11-24 DIAGNOSIS — Z9889 Other specified postprocedural states: Secondary | ICD-10-CM

## 2018-11-24 DIAGNOSIS — L03032 Cellulitis of left toe: Secondary | ICD-10-CM

## 2018-11-24 DIAGNOSIS — L02612 Cutaneous abscess of left foot: Secondary | ICD-10-CM

## 2018-11-24 DIAGNOSIS — L97514 Non-pressure chronic ulcer of other part of right foot with necrosis of bone: Secondary | ICD-10-CM

## 2018-11-24 DIAGNOSIS — L03031 Cellulitis of right toe: Secondary | ICD-10-CM

## 2018-11-24 DIAGNOSIS — M79672 Pain in left foot: Secondary | ICD-10-CM

## 2018-11-24 DIAGNOSIS — E11621 Type 2 diabetes mellitus with foot ulcer: Secondary | ICD-10-CM

## 2018-11-24 DIAGNOSIS — L97524 Non-pressure chronic ulcer of other part of left foot with necrosis of bone: Secondary | ICD-10-CM

## 2018-11-24 DIAGNOSIS — M86172 Other acute osteomyelitis, left ankle and foot: Secondary | ICD-10-CM

## 2018-11-24 NOTE — Progress Notes (Signed)
Patient underwent inpatient surgery at [facility].  DOS: 11/23/2018 Procedure: R Great Toe Amputation, Bone Biopsy

## 2018-11-25 DIAGNOSIS — S91301A Unspecified open wound, right foot, initial encounter: Secondary | ICD-10-CM

## 2018-11-25 DIAGNOSIS — Z481 Encounter for planned postprocedural wound closure: Secondary | ICD-10-CM | POA: Diagnosis not present

## 2018-11-25 NOTE — Progress Notes (Signed)
Patient underwent inpatient surgery at [facility] today.  DOS: 11/25/2018 Procedure: Right foot debridement and irrigation, late complex wound closure.

## 2018-11-30 ENCOUNTER — Encounter: Payer: 59 | Admitting: Podiatry

## 2018-12-01 ENCOUNTER — Other Ambulatory Visit: Payer: Self-pay

## 2018-12-01 ENCOUNTER — Encounter: Payer: Self-pay | Admitting: Podiatry

## 2018-12-01 ENCOUNTER — Ambulatory Visit (INDEPENDENT_AMBULATORY_CARE_PROVIDER_SITE_OTHER): Payer: 59 | Admitting: Podiatry

## 2018-12-01 VITALS — BP 138/75 | HR 82 | Temp 98.4°F | Resp 16

## 2018-12-01 DIAGNOSIS — Z9889 Other specified postprocedural states: Secondary | ICD-10-CM

## 2018-12-08 ENCOUNTER — Encounter: Payer: Self-pay | Admitting: Podiatry

## 2018-12-08 ENCOUNTER — Ambulatory Visit (INDEPENDENT_AMBULATORY_CARE_PROVIDER_SITE_OTHER): Payer: 59 | Admitting: Podiatry

## 2018-12-08 VITALS — BP 136/77 | HR 90 | Temp 97.0°F | Resp 16

## 2018-12-08 DIAGNOSIS — Z9889 Other specified postprocedural states: Secondary | ICD-10-CM

## 2018-12-12 NOTE — Progress Notes (Signed)
Subjective:  Patient ID: Gloria Hodge, female    DOB: 23-Apr-1966,  MRN: [id number]  Chief Complaint  Patient presents with  . Routine Post Op    11/25/2018 Rt foot debridement and irrigation pt. states," still having pain at the big toes areas; 8/10 R.L pain and Lt 3td toe hurts about the same." Tx: sx shoe and oxy -pt deneis N/V/F?Ch   . Diabetes    FBS: 323 A1C: 52    53 y.o. female presents for wound care.  States that the big toe area on the right is causing lot of pain about 8 of 10 left third toe hurts about the same.  Has using a surgical shoe. Review of Systems: Negative except as noted in the HPI.   Past Medical History:  Diagnosis Date  . Calf pain   . Depression   . Diabetes ([place])   . Dizziness   . Fatigue   . Frequent headaches   . IBS (irritable bowel syndrome)   . Memory loss   . Muscle pain   . Swelling     Current Outpatient Medications:  .  ALPRAZolam (XANAX) 1 MG tablet, Take 1 mg by mouth at bedtime as needed for anxiety., Disp: , Rfl:  .  cephALEXin (KEFLEX) 500 MG capsule, Take 1 capsule (500 mg total) by mouth 3 (three) times daily., Disp: 30 capsule, Rfl: 0 .  ciprofloxacin (CIPRO) 250 MG tablet, Take 1 tablet (250 mg total) by mouth 2 (two) times daily., Disp: 21 tablet, Rfl: 0 .  clindamycin (CLEOCIN) 300 MG capsule, Take 1 capsule (300 mg total) by mouth 2 (two) times daily., Disp: 14 capsule, Rfl: 0 .  cyclobenzaprine (FLEXERIL) 10 MG tablet, Take 10 mg by mouth 3 (three) times daily as needed for muscle spasms. Patient doesn't know the mg/lc, Disp: , Rfl:  .  Dapagliflozin Propanediol (FARXIGA PO), Take by mouth., Disp: , Rfl:  .  fenofibrate (TRICOR) 145 MG tablet, , Disp: , Rfl:  .  furosemide (LASIX) 20 MG tablet, , Disp: , Rfl:  .  LANTUS SOLOSTAR 100 UNIT/ML Solostar Pen, INJECT 50 UNITS AT BEDTIME, Disp: , Rfl: 2 .  levofloxacin (LEVAQUIN) 750 MG tablet, , Disp: , Rfl:  .  metFORMIN (GLUCOPHAGE) 1000 MG tablet, Take 1,000 mg by mouth 2  (two) times daily with a meal., Disp: , Rfl:  .  oxyCODONE (ROXICODONE) 15 MG immediate release tablet, , Disp: , Rfl:  .  oxyCODONE-acetaminophen (PERCOCET) 10-325 MG tablet, Take 1 tablet by mouth every 4 (four) hours as needed for pain., Disp: 10 tablet, Rfl: 0 .  promethazine (PHENERGAN) 12.5 MG tablet, Take 12.5 mg by mouth every 6 (six) hours as needed for nausea or vomiting. Patient doesn't know the mg/lc, Disp: , Rfl:  .  rosuvastatin (CRESTOR) 40 MG tablet, Take 40 mg by mouth daily., Disp: , Rfl:  .  sulfamethoxazole-trimethoprim (BACTRIM DS,SEPTRA DS) 800-160 MG tablet, , Disp: , Rfl:  .  zolpidem (AMBIEN) 10 MG tablet, Take 10 mg by mouth at bedtime as needed for sleep., Disp: , Rfl:   Social History   Tobacco Use  Smoking Status Never Smoker  Smokeless Tobacco Never Used    Allergies  Allergen Reactions  . Bactrim [Sulfamethoxazole-Trimethoprim]    Objective:   Vitals:   12/08/18 1403  BP: 136/77  Pulse: 90  Resp: 16  Temp: (!) 97 F (36.1 C)   There is no height or weight on file to calculate BMI. Constitutional Well developed. Well  nourished.  Vascular Dorsalis pedis pulses palpable bilaterally. Posterior tibial pulses palpable bilaterally. Capillary refill normal to all digits.  No cyanosis or clubbing noted. Pedal hair growth normal.  Neurologic Normal speech. Oriented to person, place, and time. Protective sensation absent  Dermatologic  left third toe amputation site healed, right hallux incisions are healing with intact suture and staple material  Orthopedic: L hallux amputation noted L 2nd toe partial amputation noted L 3rd toe partial amputation noted. R 4th toe amputation noted   Radiographs: No x-rays today Assessment:   No diagnosis found. Plan:  Patient was evaluated and treated and all questions answered.  S/p L 3rd toe amputation IPJ -Doing well, incision coapted.  -Foot redressed  Status post right hallux amputation -Doing well  incision healing well.  No signs of infection. -Keep sutures and staples intact  No follow-ups on file.

## 2018-12-14 ENCOUNTER — Encounter: Payer: Self-pay | Admitting: Podiatry

## 2018-12-14 ENCOUNTER — Ambulatory Visit (INDEPENDENT_AMBULATORY_CARE_PROVIDER_SITE_OTHER): Payer: 59 | Admitting: Podiatry

## 2018-12-14 VITALS — BP 134/71 | HR 93 | Temp 99.4°F | Resp 16

## 2018-12-14 DIAGNOSIS — Z9889 Other specified postprocedural states: Secondary | ICD-10-CM

## 2018-12-14 MED ORDER — CLINDAMYCIN HCL 150 MG PO CAPS: 150.0000 mg | ORAL_CAPSULE | Freq: Two times a day (BID) | ORAL | 0 refills | Status: DC

## 2018-12-14 NOTE — Progress Notes (Signed)
Subjective:  Patient ID: Gloria Hodge, female    DOB: Sep 05, 1966,  MRN: [id number]  Chief Complaint  Patient presents with  . Post-op Problem    Pt was picking at incision site and noticed that a staple came out and it started bleeding x today; 9/10 sharp pain Tx: dressing chagtne and neosporin -pt states [facility] would RF pain meds until Wed. -pt deneis N/V/F/CH -w/ bloody drainage, light redness and swelling -FBS: 2    53 y.o. female presents for wound care. Has been picking at her incision.   Review of Systems: Negative except as noted in the HPI.   Past Medical History:  Diagnosis Date  . Calf pain   . Depression   . Diabetes ([place])   . Dizziness   . Fatigue   . Frequent headaches   . IBS (irritable bowel syndrome)   . Memory loss   . Muscle pain   . Swelling     Current Outpatient Medications:  .  ALPRAZolam (XANAX) 1 MG tablet, Take 1 mg by mouth at bedtime as needed for anxiety., Disp: , Rfl:  .  cyclobenzaprine (FLEXERIL) 10 MG tablet, Take 10 mg by mouth 3 (three) times daily as needed for muscle spasms. Patient doesn't know the mg/lc, Disp: , Rfl:  .  Dapagliflozin Propanediol (FARXIGA PO), Take by mouth., Disp: , Rfl:  .  fenofibrate (TRICOR) 145 MG tablet, , Disp: , Rfl:  .  furosemide (LASIX) 20 MG tablet, , Disp: , Rfl:  .  HUMALOG KWIKPEN 200 UNIT/ML SOPN, 0-70= EAT SNACK, 71-150=0 UNITS, 151-200=2 UNITS, 201-250= 4 UNITS, 251-300=6 UNITS, 301-350= 8 UNITS, 251-400= 10 UNITS, OVER 400 =15 UNITS, Disp: , Rfl:  .  LANTUS SOLOSTAR 100 UNIT/ML Solostar Pen, INJECT 50 UNITS AT BEDTIME, Disp: , Rfl: 2 .  levofloxacin (LEVAQUIN) 750 MG tablet, , Disp: , Rfl:  .  metFORMIN (GLUCOPHAGE) 1000 MG tablet, Take 1,000 mg by mouth 2 (two) times daily with a meal., Disp: , Rfl:  .  oxyCODONE (ROXICODONE) 15 MG immediate release tablet, , Disp: , Rfl:  .  oxyCODONE-acetaminophen (PERCOCET) 10-325 MG tablet, Take 1 tablet by mouth every 4 (four) hours as needed  for pain., Disp: 10 tablet, Rfl: 0 .  promethazine (PHENERGAN) 12.5 MG tablet, Take 12.5 mg by mouth every 6 (six) hours as needed for nausea or vomiting. Patient doesn't know the mg/lc, Disp: , Rfl:  .  rosuvastatin (CRESTOR) 40 MG tablet, Take 40 mg by mouth daily., Disp: , Rfl:  .  sulfamethoxazole-trimethoprim (BACTRIM DS,SEPTRA DS) 800-160 MG tablet, , Disp: , Rfl:  .  zolpidem (AMBIEN) 10 MG tablet, Take 10 mg by mouth at bedtime as needed for sleep., Disp: , Rfl:  .  clindamycin (CLEOCIN) 150 MG capsule, Take 1 capsule (150 mg total) by mouth 2 (two) times daily., Disp: 14 capsule, Rfl: 0  Social History   Tobacco Use  Smoking Status Never Smoker  Smokeless Tobacco Never Used    Allergies  Allergen Reactions  . Bactrim [Sulfamethoxazole-Trimethoprim]    Objective:   Vitals:   12/14/18 1418  BP: 134/71  Pulse: 93  Resp: 16  Temp: 99.4 F (37.4 C)   There is no height or weight on file to calculate BMI. Constitutional Well developed. Well nourished.  Vascular Dorsalis pedis pulses palpable bilaterally. Posterior tibial pulses palpable bilaterally. Capillary refill normal to all digits.  No cyanosis or clubbing noted. Pedal hair growth normal.  Neurologic Normal speech. Oriented to person, place, and  time. Protective sensation absent  Dermatologic  left third toe amputation site healed, right hallux incision healing well slightly gapped with granulation tissue along incision.  Orthopedic: L hallux amputation noted L 2nd toe partial amputation noted L 3rd toe partial amputation noted. R 4th toe amputation noted   Radiographs: No x-rays today Assessment:   1. Post-operative state    Plan:  Patient was evaluated and treated and all questions answered.  S/p L 3rd toe amputation IPJ -Doing well, healed.  Status post right hallux amputation -Amputation healing well. Dressed with DSD. To apply abx oitnemtn and bandage daily. -Slight redness peri-incision likely  irritation but will prophylactically Rx clindamycin. -Discussed not picking at the incision -Staples removed.  Return in about 2 weeks (around 12/28/2018) for Post-op.

## 2018-12-14 NOTE — Progress Notes (Signed)
Subjective:  Patient ID: Gloria Hodge, female    DOB: [birth year],  MRN: [id number]  Chief Complaint  Patient presents with  . Routine Post Op    POV #1 Pt. states," doing alright." -pt denies any issues/ pain at the moment -pt denies N/V/F?Ch Tx: oxy and bactrim (break out on rash) -FBS: 239 A1C: 14   DOS: 11/23/2018 Procedure: R Great Toe Amputation, Bone Biopsy  DOS: 11/25/2018 Procedure: Right foot debridement and irrigation, late complex wound closure.  53 y.o. female returns for post-op check. History as above.  Review of Systems: Negative except as noted in the HPI. Denies N/V/F/Ch.  Past Medical History:  Diagnosis Date  . Calf pain   . Depression   . Diabetes ([place])   . Dizziness   . Fatigue   . Frequent headaches   . IBS (irritable bowel syndrome)   . Memory loss   . Muscle pain   . Swelling     Current Outpatient Medications:  .  ALPRAZolam (XANAX) 1 MG tablet, Take 1 mg by mouth at bedtime as needed for anxiety., Disp: , Rfl:  .  cephALEXin (KEFLEX) 500 MG capsule, Take 1 capsule (500 mg total) by mouth 3 (three) times daily., Disp: 30 capsule, Rfl: 0 .  ciprofloxacin (CIPRO) 250 MG tablet, Take 1 tablet (250 mg total) by mouth 2 (two) times daily., Disp: 21 tablet, Rfl: 0 .  clindamycin (CLEOCIN) 300 MG capsule, Take 1 capsule (300 mg total) by mouth 2 (two) times daily., Disp: 14 capsule, Rfl: 0 .  cyclobenzaprine (FLEXERIL) 10 MG tablet, Take 10 mg by mouth 3 (three) times daily as needed for muscle spasms. Patient doesn't know the mg/lc, Disp: , Rfl:  .  Dapagliflozin Propanediol (FARXIGA PO), Take by mouth., Disp: , Rfl:  .  fenofibrate (TRICOR) 145 MG tablet, , Disp: , Rfl:  .  furosemide (LASIX) 20 MG tablet, , Disp: , Rfl:  .  LANTUS SOLOSTAR 100 UNIT/ML Solostar Pen, INJECT 50 UNITS AT BEDTIME, Disp: , Rfl: 2 .  levofloxacin (LEVAQUIN) 750 MG tablet, , Disp: , Rfl:  .  metFORMIN (GLUCOPHAGE) 1000 MG tablet, Take 1,000 mg by mouth 2 (two) times daily  with a meal., Disp: , Rfl:  .  oxyCODONE (ROXICODONE) 15 MG immediate release tablet, , Disp: , Rfl:  .  oxyCODONE-acetaminophen (PERCOCET) 10-325 MG tablet, Take 1 tablet by mouth every 4 (four) hours as needed for pain., Disp: 10 tablet, Rfl: 0 .  promethazine (PHENERGAN) 12.5 MG tablet, Take 12.5 mg by mouth every 6 (six) hours as needed for nausea or vomiting. Patient doesn't know the mg/lc, Disp: , Rfl:  .  rosuvastatin (CRESTOR) 40 MG tablet, Take 40 mg by mouth daily., Disp: , Rfl:  .  sulfamethoxazole-trimethoprim (BACTRIM DS,SEPTRA DS) 800-160 MG tablet, , Disp: , Rfl:  .  zolpidem (AMBIEN) 10 MG tablet, Take 10 mg by mouth at bedtime as needed for sleep., Disp: , Rfl:   Social History   Tobacco Use  Smoking Status Never Smoker  Smokeless Tobacco Never Used    Allergies  Allergen Reactions  . Bactrim [Sulfamethoxazole-Trimethoprim]    Objective:   Vitals:   12/01/18 0909  BP: 138/75  Pulse: 82  Resp: 16  Temp: 98.4 F (36.9 C)   There is no height or weight on file to calculate BMI. Constitutional Well developed. Well nourished.  Vascular Foot warm and well perfused. Capillary refill normal to all digits.   Neurologic Normal speech. Oriented to person, place,  and time. Epicritic sensation to light touch grossly absent bilaterally.  Dermatologic Skin healing well without signs of infection. Skin edges well coapted without signs of infection.  Orthopedic: Tenderness to palpation noted about the surgical site.   Radiographs: None Assessment:   1. Post-operative state    Plan:  Patient was evaluated and treated and all questions answered.  S/p foot surgery bilaterally -Progressing as expected post-operatively. -XR: None -WB Status: WBAT in surgical shoes bilat -Sutures: out left, intact right. -Medications: none refilled. -Foot redressed.  No follow-ups on file.

## 2018-12-15 ENCOUNTER — Encounter: Payer: 59 | Admitting: Podiatry

## 2018-12-28 ENCOUNTER — Encounter: Payer: 59 | Admitting: Podiatry

## 2019-01-04 ENCOUNTER — Ambulatory Visit (INDEPENDENT_AMBULATORY_CARE_PROVIDER_SITE_OTHER): Payer: 59

## 2019-01-04 ENCOUNTER — Encounter: Payer: Self-pay | Admitting: Podiatry

## 2019-01-04 ENCOUNTER — Ambulatory Visit (INDEPENDENT_AMBULATORY_CARE_PROVIDER_SITE_OTHER): Payer: 59 | Admitting: Podiatry

## 2019-01-04 ENCOUNTER — Telehealth: Payer: Self-pay | Admitting: *Deleted

## 2019-01-04 ENCOUNTER — Other Ambulatory Visit: Payer: Self-pay

## 2019-01-04 VITALS — BP 157/79 | HR 89 | Temp 98.8°F | Resp 16

## 2019-01-04 DIAGNOSIS — Z481 Encounter for planned postprocedural wound closure: Secondary | ICD-10-CM

## 2019-01-04 DIAGNOSIS — Z9889 Other specified postprocedural states: Secondary | ICD-10-CM

## 2019-01-04 DIAGNOSIS — S91301A Unspecified open wound, right foot, initial encounter: Secondary | ICD-10-CM

## 2019-01-04 NOTE — Telephone Encounter (Signed)
Faxed required form, clinicals and demographics to [facility] for [place].

## 2019-01-04 NOTE — Progress Notes (Signed)
Subjective:  Patient ID: Gloria Hodge, female    DOB: January 27, 1966,  MRN: [id number]  Chief Complaint  Patient presents with  . Routine Post Op    pov#5 dos 02.04.2020 Amputation Toe MPJ Joint 3rd Lt Pt.s tates," it's getting a lot better, sometimes with a pulse pain; 5/10 intermittne tpian." Tx: clindamycin (completed), abx cream, and bandage -pt deneis N/V/F/Ch/drainage/redness/swelling -FBS: 70    53 y.o. female presents for wound care. Hx as above.  Review of Systems: Negative except as noted in the HPI.   Past Medical History:  Diagnosis Date  . Calf pain   . Depression   . Diabetes ([place])   . Dizziness   . Fatigue   . Frequent headaches   . IBS (irritable bowel syndrome)   . Memory loss   . Muscle pain   . Swelling     Current Outpatient Medications:  .  ALPRAZolam (XANAX) 1 MG tablet, Take 1 mg by mouth at bedtime as needed for anxiety., Disp: , Rfl:  .  clindamycin (CLEOCIN) 150 MG capsule, Take 1 capsule (150 mg total) by mouth 2 (two) times daily., Disp: 14 capsule, Rfl: 0 .  cyclobenzaprine (FLEXERIL) 10 MG tablet, Take 10 mg by mouth 3 (three) times daily as needed for muscle spasms. Patient doesn't know the mg/lc, Disp: , Rfl:  .  Dapagliflozin Propanediol (FARXIGA PO), Take by mouth., Disp: , Rfl:  .  fenofibrate (TRICOR) 145 MG tablet, , Disp: , Rfl:  .  furosemide (LASIX) 20 MG tablet, , Disp: , Rfl:  .  HUMALOG KWIKPEN 200 UNIT/ML SOPN, 0-70= EAT SNACK, 71-150=0 UNITS, 151-200=2 UNITS, 201-250= 4 UNITS, 251-300=6 UNITS, 301-350= 8 UNITS, 251-400= 10 UNITS, OVER 400 =15 UNITS, Disp: , Rfl:  .  LANTUS SOLOSTAR 100 UNIT/ML Solostar Pen, INJECT 50 UNITS AT BEDTIME, Disp: , Rfl: 2 .  levofloxacin (LEVAQUIN) 750 MG tablet, , Disp: , Rfl:  .  metFORMIN (GLUCOPHAGE) 1000 MG tablet, Take 1,000 mg by mouth 2 (two) times daily with a meal., Disp: , Rfl:  .  oxyCODONE (ROXICODONE) 15 MG immediate release tablet, , Disp: , Rfl:  .  oxyCODONE-acetaminophen (PERCOCET)  10-325 MG tablet, Take 1 tablet by mouth every 4 (four) hours as needed for pain., Disp: 10 tablet, Rfl: 0 .  promethazine (PHENERGAN) 12.5 MG tablet, Take 12.5 mg by mouth every 6 (six) hours as needed for nausea or vomiting. Patient doesn't know the mg/lc, Disp: , Rfl:  .  rosuvastatin (CRESTOR) 40 MG tablet, Take 40 mg by mouth daily., Disp: , Rfl:  .  sulfamethoxazole-trimethoprim (BACTRIM DS,SEPTRA DS) 800-160 MG tablet, , Disp: , Rfl:  .  zolpidem (AMBIEN) 10 MG tablet, Take 10 mg by mouth at bedtime as needed for sleep., Disp: , Rfl:   Social History   Tobacco Use  Smoking Status Never Smoker  Smokeless Tobacco Never Used    Allergies  Allergen Reactions  . Bactrim [Sulfamethoxazole-Trimethoprim]    Objective:   Vitals:   01/04/19 1403  BP: (!) 157/79  Pulse: 89  Resp: 16  Temp: 98.8 F (37.1 C)   There is no height or weight on file to calculate BMI. Constitutional Well developed. Well nourished.  Vascular Dorsalis pedis pulses palpable bilaterally. Posterior tibial pulses palpable bilaterally. Capillary refill normal to all digits.  No cyanosis or clubbing noted. Pedal hair growth normal.  Neurologic Normal speech. Oriented to person, place, and time. Protective sensation absent  Dermatologic  left third toe amputation site healed,  Right  hallux incision healed with slight local warmth and erythema.  Orthopedic: L hallux amputation noted L 2nd toe partial amputation noted L 3rd toe partial amputation noted. R 4th toe amputation noted   Radiographs: XR taken no osseous erosions. Assessment:   1. Post-operative state   2. Encounter for planned post-operative wound closure   3. Open wound of right foot, initial encounter    Plan:  Patient was evaluated and treated and all questions answered.  S/p L 3rd toe amputation IPJ -Doing well, healed.  Status post right hallux amputation -Amputation site healed. Still with slight warmth and redness. 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 -Continue WBAT in normal shoegear.  Return in about 2 weeks (around 01/18/2019) for Right, Post-op.

## 2019-01-04 NOTE — Telephone Encounter (Signed)
-----   Message from Evelina Bucy, DPM sent at 01/04/2019  2:21 PM EDT ----- Can we order Donnamae Jude for 5 days for this patient from the specialty pharmacy?

## 2019-01-18 ENCOUNTER — Encounter: Payer: 59 | Admitting: Podiatry

## 2019-01-19 ENCOUNTER — Encounter: Payer: 59 | Admitting: Podiatry

## 2019-01-26 ENCOUNTER — Encounter: Payer: 59 | Admitting: Podiatry

## 2019-02-09 ENCOUNTER — Ambulatory Visit (INDEPENDENT_AMBULATORY_CARE_PROVIDER_SITE_OTHER): Payer: 59 | Admitting: Podiatry

## 2019-02-09 ENCOUNTER — Encounter: Payer: Self-pay | Admitting: Podiatry

## 2019-02-09 ENCOUNTER — Other Ambulatory Visit: Payer: Self-pay

## 2019-02-09 VITALS — BP 149/84 | HR 78 | Temp 98.8°F | Resp 16

## 2019-02-09 DIAGNOSIS — L600 Ingrowing nail: Secondary | ICD-10-CM

## 2019-02-09 MED ORDER — NEOMYCIN-POLYMYXIN-HC 3.5-10000-1 OT SOLN: OTIC | 0 refills | Status: DC

## 2019-02-09 NOTE — Patient Instructions (Signed)

## 2019-02-09 NOTE — Progress Notes (Signed)
Subjective:  Patient ID: Gloria Hodge, female    DOB: 1966/01/25,  MRN: [id number]  Chief Complaint  Patient presents with  . Routine Post Op    pov dos 02.04.2020 Amputation Toe MPJ Joint 3rd Lt PT.states," dong better, but there's one part that's not healin good, it's still open." Tx": pain meds, and neoposrin -pt denies draiange/redness/swelling Pt. states she has occassional pains durring the night; 8/10  . Diabetes    FBS: 109 A1C: 67    53 y.o. female presents for wound care. Hx as above. New complaint of right 2nd toenail thickening and pain. Would like the nail removed.  Review of Systems: Negative except as noted in the HPI.   Past Medical History:  Diagnosis Date  . Calf pain   . Depression   . Diabetes ([place])   . Dizziness   . Fatigue   . Frequent headaches   . IBS (irritable bowel syndrome)   . Memory loss   . Muscle pain   . Swelling     Current Outpatient Medications:  .  ALPRAZolam (XANAX) 1 MG tablet, Take 1 mg by mouth at bedtime as needed for anxiety., Disp: , Rfl:  .  clindamycin (CLEOCIN) 150 MG capsule, Take 1 capsule (150 mg total) by mouth 2 (two) times daily., Disp: 14 capsule, Rfl: 0 .  cyclobenzaprine (FLEXERIL) 10 MG tablet, Take 10 mg by mouth 3 (three) times daily as needed for muscle spasms. Patient doesn't know the mg/lc, Disp: , Rfl:  .  Dapagliflozin Propanediol (FARXIGA PO), Take by mouth., Disp: , Rfl:  .  fenofibrate (TRICOR) 145 MG tablet, , Disp: , Rfl:  .  furosemide (LASIX) 20 MG tablet, , Disp: , Rfl:  .  HUMALOG KWIKPEN 200 UNIT/ML SOPN, 0-70= EAT SNACK, 71-150=0 UNITS, 151-200=2 UNITS, 201-250= 4 UNITS, 251-300=6 UNITS, 301-350= 8 UNITS, 251-400= 10 UNITS, OVER 400 =15 UNITS, Disp: , Rfl:  .  LANTUS SOLOSTAR 100 UNIT/ML Solostar Pen, INJECT 50 UNITS AT BEDTIME, Disp: , Rfl: 2 .  levofloxacin (LEVAQUIN) 750 MG tablet, , Disp: , Rfl:  .  metFORMIN (GLUCOPHAGE) 1000 MG tablet, Take 1,000 mg by mouth 2 (two) times daily with a meal.,  Disp: , Rfl:  .  oxyCODONE (ROXICODONE) 15 MG immediate release tablet, , Disp: , Rfl:  .  oxyCODONE-acetaminophen (PERCOCET) 10-325 MG tablet, Take 1 tablet by mouth every 4 (four) hours as needed for pain., Disp: 10 tablet, Rfl: 0 .  promethazine (PHENERGAN) 12.5 MG tablet, Take 12.5 mg by mouth every 6 (six) hours as needed for nausea or vomiting. Patient doesn't know the mg/lc, Disp: , Rfl:  .  rosuvastatin (CRESTOR) 40 MG tablet, Take 40 mg by mouth daily., Disp: , Rfl:  .  sulfamethoxazole-trimethoprim (BACTRIM DS,SEPTRA DS) 800-160 MG tablet, , Disp: , Rfl:  .  zolpidem (AMBIEN) 10 MG tablet, Take 10 mg by mouth at bedtime as needed for sleep., Disp: , Rfl:  .  neomycin-polymyxin-hydrocortisone (CORTISPORIN) OTIC solution, Apply 2-3 drops to the ingrown toenail site twice daily. Cover with band-aid., Disp: 10 mL, Rfl: 0  Social History   Tobacco Use  Smoking Status Never Smoker  Smokeless Tobacco Never Used    Allergies  Allergen Reactions  . Bactrim [Sulfamethoxazole-Trimethoprim]    Objective:   Vitals:   02/09/19 1418  BP: (!) 149/84  Pulse: 78  Resp: 16  Temp: 98.8 F (37.1 C)   There is no height or weight on file to calculate BMI. Constitutional Well developed. Well  nourished.  Vascular Dorsalis pedis pulses palpable bilaterally. Posterior tibial pulses palpable bilaterally. Capillary refill normal to all digits.  No cyanosis or clubbing noted. Pedal hair growth normal.  Neurologic Normal speech. Oriented to person, place, and time. Protective sensation absent  Dermatologic All incisions healed Right 2nd toe.   Orthopedic: L hallux amputation noted L 2nd toe partial amputation noted L 3rd toe partial amputation noted. R 4th toe amputation noted   Radiographs: None today. Assessment:   1. Ingrown nail    Plan:  Patient was evaluated and treated and all questions answered.  S/p L 3rd toe amputation IPJ -Doing well, healed.  Status post right  hallux amputation -Amputation site essentially healed.   Hammertoe Right 2nd toe -Should distal callus persist would consider flexor tenotomy to straighten the lesser toes.  Right 2nd toenail pain, dystrophy -Patient would like the nail permanently removed. Discussed r/b/a of procedure. Discussed risk of delayed healing and infection. Patient understands wishes to proceed. -Patient elects to proceed with toenail removal today -Nail excised. See procedure note. -Educated on post-procedure care including soaking. Written instructions provided. -Patient to follow up in 2 weeks for nail check.  Procedure: Excision of Ingrown Toenail Location: Right 2nd Anesthesia: Lidocaine 1% plain; 1.20mL and Marcaine 0.5% plain; 1.10mL, digital block. Skin Prep: Betadine. Dressing: Silvadene; telfa; dry, sterile, compression dressing. Technique: Following skin prep, the toe was exsanguinated and a tourniquet was secured at the base of the toe. The affected nail border was freed, split with a nail splitter, and excised. Chemical matrixectomy was then performed with phenol and irrigated out with alcohol. The tourniquet was then removed and sterile dressing applied. Disposition: Patient tolerated procedure well. Patient to return in 2 weeks for follow-up.    Return in about 2 weeks (around 02/23/2019) for Nail Check.

## 2019-02-23 ENCOUNTER — Ambulatory Visit: Payer: 59 | Admitting: Podiatry

## 2019-03-02 ENCOUNTER — Encounter: Payer: Self-pay | Admitting: Podiatry

## 2019-03-02 ENCOUNTER — Other Ambulatory Visit: Payer: Self-pay

## 2019-03-02 ENCOUNTER — Ambulatory Visit (INDEPENDENT_AMBULATORY_CARE_PROVIDER_SITE_OTHER): Payer: 59 | Admitting: Podiatry

## 2019-03-02 VITALS — Temp 99.4°F | Resp 16

## 2019-03-02 DIAGNOSIS — L600 Ingrowing nail: Secondary | ICD-10-CM

## 2019-03-02 MED ORDER — CEPHALEXIN 500 MG PO CAPS: 500.0000 mg | ORAL_CAPSULE | Freq: Two times a day (BID) | ORAL | 0 refills | Status: DC

## 2019-03-09 ENCOUNTER — Telehealth: Payer: Self-pay | Admitting: *Deleted

## 2019-03-09 ENCOUNTER — Ambulatory Visit: Payer: 59 | Admitting: Podiatry

## 2019-03-09 NOTE — Progress Notes (Signed)
  Subjective:  Patient ID: Gloria Hodge, female    DOB: 07-30-1966,  MRN: [id number]  Chief Complaint  Patient presents with  . nail check    F/U Rt 2nd toenial check pt. states,": Toenail doing okay, it's healing up." Tx: neosporin, and bandaid -pt deneis draiange/redness/swelling   . Diabetes    FBS: 50   53 y.o. female returns for the above complaint. History above confirmed with patient.  Objective:   General AA&O x3. Normal mood and affect.  Vascular Foot warm and well perfused with good capillary refill.  Neurologic Sensation grossly intact.  Dermatologic Right second toe slightly edematous with scant redness. No local warmth.  No purulence no fluctuance slight serous drainage only  Orthopedic: No tenderness to palpation of the toe.   Assessment & Plan:  Patient was evaluated and treated and all questions answered.  S/p Ingrown Toenail Excision, right second toe -Appears to be healing well. Will rx keflex prophylactically given patient's hx. Will monitor closely and have patient f/u next week for recheck.

## 2019-03-09 NOTE — Telephone Encounter (Signed)
Patient called to cancel her appointment due to she started not feeling well on Saturday she feels like it is her allergies but doesn't want to take a chance.  She states her to if fine but we rescheduled her appointment to follow up.

## 2019-03-22 ENCOUNTER — Ambulatory Visit (INDEPENDENT_AMBULATORY_CARE_PROVIDER_SITE_OTHER): Payer: 59 | Admitting: Podiatry

## 2019-03-22 ENCOUNTER — Other Ambulatory Visit: Payer: Self-pay

## 2019-03-22 ENCOUNTER — Encounter: Payer: Self-pay | Admitting: Podiatry

## 2019-03-22 VITALS — Temp 98.5°F | Resp 16

## 2019-03-22 DIAGNOSIS — L600 Ingrowing nail: Secondary | ICD-10-CM

## 2019-04-05 ENCOUNTER — Ambulatory Visit (INDEPENDENT_AMBULATORY_CARE_PROVIDER_SITE_OTHER): Payer: 59 | Admitting: Podiatry

## 2019-04-05 DIAGNOSIS — Z5329 Procedure and treatment not carried out because of patient's decision for other reasons: Secondary | ICD-10-CM

## 2019-04-05 NOTE — Progress Notes (Signed)
Same day no-show.

## 2019-04-12 NOTE — Progress Notes (Signed)
  Subjective:  Patient ID: Gloria Hodge, female    DOB: [birth year],  MRN: [id number]  Chief Complaint  Patient presents with  . nail check    F/U Rt 2nd toenail check Pt. states," it's a lot better." -w/ sharp pains at night around toenail; 7/10." tx: neosporin   53 y.o. female returns for the above complaint. History above confirmed with patient.  Objective:   General AA&O x3. Normal mood and affect.  Vascular Foot warm and well perfused with good capillary refill.  Neurologic Sensation grossly intact.  Dermatologic Right second toe without active warmth edema drainage redness.  No signs of acute infection  Orthopedic: No tenderness to palpation of the toe.   Assessment & Plan:  Patient was evaluated and treated and all questions answered.  S/p Ingrown Toenail Excision, right second toe -Doing well no signs of infection. No need for continued abx. F/u in 2 weeks for recheck.

## 2019-04-19 ENCOUNTER — Ambulatory Visit (INDEPENDENT_AMBULATORY_CARE_PROVIDER_SITE_OTHER): Payer: 59 | Admitting: Podiatry

## 2019-04-19 ENCOUNTER — Other Ambulatory Visit: Payer: Self-pay

## 2019-04-19 ENCOUNTER — Encounter: Payer: Self-pay | Admitting: Podiatry

## 2019-04-19 VITALS — Temp 99.2°F | Resp 16

## 2019-04-19 DIAGNOSIS — L97521 Non-pressure chronic ulcer of other part of left foot limited to breakdown of skin: Secondary | ICD-10-CM

## 2019-04-19 DIAGNOSIS — M2041 Other hammer toe(s) (acquired), right foot: Secondary | ICD-10-CM

## 2019-04-19 NOTE — Progress Notes (Signed)
Subjective:  Patient ID: Gloria Hodge, female    DOB: 1966-07-16,  MRN: [id number]  Chief Complaint  Patient presents with  . Hammer Toe    F/U Rt 2nd HT-pt. states," it's doing better, but at night time I get the sharp-aching pain; 6/10." Tx: none -pt denies swelling/redness/drianage of toe   . Foot Pain    Lt 4th toe pain, swelling and with a lesion x 1 wk; pt states it happened after trimming her toenails; 3/10 pain Tx: neosporin -pt denies drainage  . Diabetes    FBS: 57    53 y.o. female presents for f/u of the righ t2nd toe hammertoe. Also complains of new issue to the left 4th toe as above.  Review of Systems: Negative except as noted in the HPI.   Past Medical History:  Diagnosis Date  . Calf pain   . Depression   . Diabetes ([place])   . Dizziness   . Fatigue   . Frequent headaches   . IBS (irritable bowel syndrome)   . Memory loss   . Muscle pain   . Swelling     Current Outpatient Medications:  .  ALPRAZolam (XANAX) 1 MG tablet, Take 1 mg by mouth at bedtime as needed for anxiety., Disp: , Rfl:  .  cephALEXin (KEFLEX) 500 MG capsule, Take 1 capsule (500 mg total) by mouth 2 (two) times daily., Disp: 14 capsule, Rfl: 0 .  clindamycin (CLEOCIN) 150 MG capsule, Take 1 capsule (150 mg total) by mouth 2 (two) times daily., Disp: 14 capsule, Rfl: 0 .  cyclobenzaprine (FLEXERIL) 10 MG tablet, Take 10 mg by mouth 3 (three) times daily as needed for muscle spasms. Patient doesn't know the mg/lc, Disp: , Rfl:  .  Dapagliflozin Propanediol (FARXIGA PO), Take by mouth., Disp: , Rfl:  .  fenofibrate (TRICOR) 145 MG tablet, , Disp: , Rfl:  .  furosemide (LASIX) 20 MG tablet, , Disp: , Rfl:  .  HUMALOG KWIKPEN 200 UNIT/ML SOPN, 0-70= EAT SNACK, 71-150=0 UNITS, 151-200=2 UNITS, 201-250= 4 UNITS, 251-300=6 UNITS, 301-350= 8 UNITS, 251-400= 10 UNITS, OVER 400 =15 UNITS, Disp: , Rfl:  .  LANTUS SOLOSTAR 100 UNIT/ML Solostar Pen, INJECT 50 UNITS AT BEDTIME, Disp: , Rfl: 2 .   levofloxacin (LEVAQUIN) 750 MG tablet, , Disp: , Rfl:  .  metFORMIN (GLUCOPHAGE) 1000 MG tablet, Take 1,000 mg by mouth 2 (two) times daily with a meal., Disp: , Rfl:  .  neomycin-polymyxin-hydrocortisone (CORTISPORIN) OTIC solution, Apply 2-3 drops to the ingrown toenail site twice daily. Cover with band-aid., Disp: 10 mL, Rfl: 0 .  oxyCODONE (ROXICODONE) 15 MG immediate release tablet, , Disp: , Rfl:  .  oxyCODONE-acetaminophen (PERCOCET) 10-325 MG tablet, Take 1 tablet by mouth every 4 (four) hours as needed for pain., Disp: 10 tablet, Rfl: 0 .  promethazine (PHENERGAN) 12.5 MG tablet, Take 12.5 mg by mouth every 6 (six) hours as needed for nausea or vomiting. Patient doesn't know the mg/lc, Disp: , Rfl:  .  rosuvastatin (CRESTOR) 40 MG tablet, Take 40 mg by mouth daily., Disp: , Rfl:  .  sulfamethoxazole-trimethoprim (BACTRIM DS,SEPTRA DS) 800-160 MG tablet, , Disp: , Rfl:  .  zolpidem (AMBIEN) 10 MG tablet, Take 10 mg by mouth at bedtime as needed for sleep., Disp: , Rfl:   Social History   Tobacco Use  Smoking Status Never Smoker  Smokeless Tobacco Never Used    Allergies  Allergen Reactions  . Bactrim [Sulfamethoxazole-Trimethoprim]    Objective:  Vitals:   04/19/19 1424  Resp: 16  Temp: 99.2 F (37.3 C)   There is no height or weight on file to calculate BMI. Constitutional Well developed. Well nourished.  Vascular Dorsalis pedis pulses palpable bilaterally. Posterior tibial pulses palpable bilaterally. Capillary refill normal to all digits.  No cyanosis or clubbing noted. Pedal hair growth normal.  Neurologic Normal speech. Oriented to person, place, and time. Protective sensation absent  Dermatologic Left 4th toe small laceration with granular base slight hyperkeratosis. Right 2nd toe with distal callus. No open wound dorsaly.  Orthopedic: L hallux amputation noted L 2nd toe partial amputation noted L 3rd toe partial amputation noted. R 4th toe amputation noted  R 2nd toe semi-reducible hammertoe    Radiographs: None today. Assessment:   1. Hammertoe of right foot   2. Ulcer of toe, left, limited to breakdown of skin [facility])    Plan:  Patient was evaluated and treated and all questions answered.  Right 2nd toe hammertoe -Patient elects to proceed with flexor tenotomy as below.  Procedure: Flexor Tenotomy Indication for Procedure: toe with semi-reducible hammertoe with distal tip ulceration. Flexor tenotomy indicated to alleviate contracture, reduce pressure, and enhance healing of the ulceration. Location: right, 2nd toe Anesthesia: Lidocaine 1% plain; 1.5 mL and Marcaine 0.5% plain; 1.5 mL digital block Instrumentation: 18 g needle  Technique: The toe was anesthetized as above and prepped in the usual fashion. The toe was exsanquinated and a tourniquet was secured at the base of the toe. An 18g needle was then used to percutaneously release the flexor tendon at the plantar surface of the toe with noted release of the hammertoe deformity. The incision was then dressed with antibiotic ointment and band-aid. Compression splint dressing applied. Patient tolerated the procedure well. Dressing: Dry, sterile, compression dressing. Disposition: Patient tolerated procedure well. Patient to return in 1 week for follow-up.   Left 4th toe ulcer -Dressed with povidone and DSD  No follow-ups on file.

## 2019-05-03 ENCOUNTER — Telehealth: Payer: Self-pay | Admitting: *Deleted

## 2019-05-03 NOTE — Telephone Encounter (Signed)
Patient returned call to resch appt due to dr out.  Patient stated that she thought he was going to call in a refill on her antibiotics but pharmacy has no record please advise.

## 2019-05-04 ENCOUNTER — Ambulatory Visit: Payer: 59 | Admitting: Podiatry

## 2019-05-11 ENCOUNTER — Other Ambulatory Visit: Payer: Self-pay | Admitting: Podiatry

## 2019-05-11 ENCOUNTER — Other Ambulatory Visit: Payer: Self-pay

## 2019-05-11 ENCOUNTER — Ambulatory Visit (INDEPENDENT_AMBULATORY_CARE_PROVIDER_SITE_OTHER): Payer: 59 | Admitting: Podiatry

## 2019-05-11 ENCOUNTER — Ambulatory Visit (INDEPENDENT_AMBULATORY_CARE_PROVIDER_SITE_OTHER): Payer: 59

## 2019-05-11 DIAGNOSIS — M8580 Other specified disorders of bone density and structure, unspecified site: Secondary | ICD-10-CM | POA: Diagnosis not present

## 2019-05-11 DIAGNOSIS — E1142 Type 2 diabetes mellitus with diabetic polyneuropathy: Secondary | ICD-10-CM

## 2019-05-11 DIAGNOSIS — M2041 Other hammer toe(s) (acquired), right foot: Secondary | ICD-10-CM

## 2019-05-11 DIAGNOSIS — M86172 Other acute osteomyelitis, left ankle and foot: Secondary | ICD-10-CM | POA: Diagnosis not present

## 2019-05-11 DIAGNOSIS — L97521 Non-pressure chronic ulcer of other part of left foot limited to breakdown of skin: Secondary | ICD-10-CM | POA: Diagnosis not present

## 2019-05-11 DIAGNOSIS — E11621 Type 2 diabetes mellitus with foot ulcer: Secondary | ICD-10-CM | POA: Diagnosis not present

## 2019-05-11 DIAGNOSIS — L97524 Non-pressure chronic ulcer of other part of left foot with necrosis of bone: Secondary | ICD-10-CM

## 2019-05-11 MED ORDER — DOXYCYCLINE HYCLATE 100 MG PO TABS: 100.0000 mg | ORAL_TABLET | Freq: Two times a day (BID) | ORAL | 0 refills | Status: DC

## 2019-05-11 NOTE — Patient Instructions (Signed)
Pre-Operative Instructions  Congratulations, you have decided to take an important step towards improving your quality of life.  You can be assured that the doctors and staff at [facility] will be with you every step of the way.  Here are some important things you should know:  1. Plan to be at the surgery center/hospital at least 1 (one) hour prior to your scheduled time, unless otherwise directed by the surgical center/hospital staff.  You must have a responsible adult accompany you, remain during the surgery and drive you home.  Make sure you have directions to the surgical center/hospital to ensure you arrive on time. 2. If you are having surgery at [facility] or [facility] hospitals, you will need a copy of your medical history and physical form from your family physician within one month prior to the date of surgery. We will give you a form for your primary physician to complete.  3. We make every effort to accommodate the date you request for surgery.  However, there are times where surgery dates or times have to be moved.  We will contact you as soon as possible if a change in schedule is required.   4. No aspirin/ibuprofen for one week before surgery.  If you are on aspirin, any non-steroidal anti-inflammatory medications (Mobic, Aleve, Ibuprofen) should not be taken seven (7) days prior to your surgery.  You make take Tylenol for pain prior to surgery.  5. Medications - If you are taking daily heart and blood pressure medications, seizure, reflux, allergy, asthma, anxiety, pain or diabetes medications, make sure you notify the surgery center/hospital before the day of surgery so they can tell you which medications you should take or avoid the day of surgery. 6. No food or drink after midnight the night before surgery unless directed otherwise by surgical center/hospital staff. 7. No alcoholic beverages 24-hours prior to surgery.  No smoking 24-hours prior or 24-hours after  surgery. 8. Wear loose pants or shorts. They should be loose enough to fit over bandages, boots, and casts. 9. Don't wear slip-on shoes. Sneakers are preferred. 10. Bring your boot with you to the surgery center/hospital.  Also bring crutches or a walker if your physician has prescribed it for you.  If you do not have this equipment, it will be provided for you after surgery. 11. If you have not been contacted by the surgery center/hospital by the day before your surgery, call to confirm the date and time of your surgery. 12. Leave-time from work may vary depending on the type of surgery you have.  Appropriate arrangements should be made prior to surgery with your employer. 13. Prescriptions will be provided immediately following surgery by your doctor.  Fill these as soon as possible after surgery and take the medication as directed. Pain medications will not be refilled on weekends and must be approved by the doctor. 14. Remove nail polish on the operative foot and avoid getting pedicures prior to surgery. 15. Wash the night before surgery.  The night before surgery wash the foot and leg well with water and the antibacterial soap provided. Be sure to pay special attention to beneath the toenails and in between the toes.  Wash for at least three (3) minutes. Rinse thoroughly with water and dry well with a towel.  Perform this wash unless told not to do so by your physician.  Enclosed: 1 Ice pack (please put in freezer the night before surgery)   1 Hibiclens skin cleaner     Pre-op instructions  If you have any questions regarding the instructions, please do not hesitate to call our office.  [place]: [address], [place], [place] [address] -- [phone]  [place]: [address] [place], [place] [address] -- [phone]  [place]: [address]  [place], [place] [address] -- [phone]  [place]: [address], [address], [place], [place] [address] -- [phone]  Website:  [email or web address] 

## 2019-05-14 ENCOUNTER — Telehealth: Payer: Self-pay | Admitting: *Deleted

## 2019-05-14 NOTE — Telephone Encounter (Signed)
DOS 05/18/2019; BONE DEBRIDEMENT - [place], [address], FLEXOR TENOTOMY - [id number]  [employer]IC [employer]: Effective Date - Month to month policy good until 83/37/4451 $250 deductible $2700 out of pocket max. 90% Co-insurance    Call Ref. # [id number] - PRE-CERTIFICATION IS NOT REQUIRED.    According to Aaron Edelman, Pre-certification is NOT REQUIRED.  The codes are not on the list.

## 2019-05-16 NOTE — Progress Notes (Signed)
Subjective:  Patient ID: Gloria Hodge, female    DOB: [birth year],  MRN: [id number]  No chief complaint on file.   53 y.o. female presents for f/u.  States that the left fourth toe is doing worse it is red and swollen.  Endorses clear bloody drainage.  Last fasting blood sugar 353 with A1c of 10 Review of Systems: Negative except as noted in the HPI.   Past Medical History:  Diagnosis Date  . Calf pain   . Depression   . Diabetes ([place])   . Dizziness   . Fatigue   . Frequent headaches   . IBS (irritable bowel syndrome)   . Memory loss   . Muscle pain   . Swelling     Current Outpatient Medications:  .  ALPRAZolam (XANAX) 1 MG tablet, Take 1 mg by mouth at bedtime as needed for anxiety., Disp: , Rfl:  .  cephALEXin (KEFLEX) 500 MG capsule, Take 1 capsule (500 mg total) by mouth 2 (two) times daily., Disp: 14 capsule, Rfl: 0 .  clindamycin (CLEOCIN) 150 MG capsule, Take 1 capsule (150 mg total) by mouth 2 (two) times daily., Disp: 14 capsule, Rfl: 0 .  Continuous Blood Gluc Receiver (FREESTYLE LIBRE 14 DAY READER) DEVI, , Disp: , Rfl:  .  Continuous Blood Gluc Sensor (FREESTYLE LIBRE 14 DAY SENSOR) MISC, , Disp: , Rfl:  .  cyclobenzaprine (FLEXERIL) 10 MG tablet, Take 10 mg by mouth 3 (three) times daily as needed for muscle spasms. Patient doesn't know the mg/lc, Disp: , Rfl:  .  Dapagliflozin Propanediol (FARXIGA PO), Take by mouth., Disp: , Rfl:  .  doxycycline (VIBRA-TABS) 100 MG tablet, Take 1 tablet (100 mg total) by mouth 2 (two) times daily., Disp: 20 tablet, Rfl: 0 .  fenofibrate (TRICOR) 145 MG tablet, , Disp: , Rfl:  .  furosemide (LASIX) 20 MG tablet, , Disp: , Rfl:  .  HUMALOG KWIKPEN 200 UNIT/ML SOPN, 0-70= EAT SNACK, 71-150=0 UNITS, 151-200=2 UNITS, 201-250= 4 UNITS, 251-300=6 UNITS, 301-350= 8 UNITS, 251-400= 10 UNITS, OVER 400 =15 UNITS, Disp: , Rfl:  .  LANTUS SOLOSTAR 100 UNIT/ML Solostar Pen, INJECT 50 UNITS AT BEDTIME, Disp: , Rfl: 2 .  levofloxacin  (LEVAQUIN) 750 MG tablet, , Disp: , Rfl:  .  metFORMIN (GLUCOPHAGE) 1000 MG tablet, Take 1,000 mg by mouth 2 (two) times daily with a meal., Disp: , Rfl:  .  neomycin-polymyxin-hydrocortisone (CORTISPORIN) OTIC solution, Apply 2-3 drops to the ingrown toenail site twice daily. Cover with band-aid., Disp: 10 mL, Rfl: 0 .  oxyCODONE (ROXICODONE) 15 MG immediate release tablet, , Disp: , Rfl:  .  oxyCODONE-acetaminophen (PERCOCET) 10-325 MG tablet, Take 1 tablet by mouth every 4 (four) hours as needed for pain., Disp: 10 tablet, Rfl: 0 .  promethazine (PHENERGAN) 12.5 MG tablet, Take 12.5 mg by mouth every 6 (six) hours as needed for nausea or vomiting. Patient doesn't know the mg/lc, Disp: , Rfl:  .  rosuvastatin (CRESTOR) 40 MG tablet, Take 40 mg by mouth daily., Disp: , Rfl:  .  sulfamethoxazole-trimethoprim (BACTRIM DS,SEPTRA DS) 800-160 MG tablet, , Disp: , Rfl:  .  zolpidem (AMBIEN) 10 MG tablet, Take 10 mg by mouth at bedtime as needed for sleep., Disp: , Rfl:   Social History   Tobacco Use  Smoking Status Never Smoker  Smokeless Tobacco Never Used    Allergies  Allergen Reactions  . Bactrim [Sulfamethoxazole-Trimethoprim]    Objective:   There were no vitals filed for  this visit. There is no height or weight on file to calculate BMI. Constitutional Well developed. Well nourished.  Vascular Dorsalis pedis pulses palpable bilaterally. Posterior tibial pulses palpable bilaterally. Capillary refill normal to all digits.  No cyanosis or clubbing noted. Pedal hair growth normal.  Neurologic Normal speech. Oriented to person, place, and time. Protective sensation absent  Dermatologic Left 4th toe with scant redness and swelling. Right 2nd toe without distal callus and healed incision area.  Orthopedic: L hallux amputation noted L 2nd toe partial amputation noted L 3rd toe partial amputation noted. R 4th toe amputation noted R 2nd toe rectus    Radiographs: None today.  Assessment:   1. Hammertoe of right foot   2. Ulcer of toe, left, limited to breakdown of skin ([place])   3. Bone erosion   4. Acute osteomyelitis of left ankle or foot ([place])   5. DM type 2 with diabetic peripheral neuropathy ([place])   6. Diabetic ulcer of toe of left foot associated with type 2 diabetes mellitus, with necrosis of bone ([place])    Plan:  Patient was evaluated and treated and all questions answered.  Right 2nd toe hammertoe -Well-healed no pain patient pleased   Left 4th toe ulcer -Caused by her picking at the nail -X-rays taken reviewed with small area of distal phalangeal erosion noted -Rx doxycycline -Discussed removal of amputation proceeding with resection of infected bone and antibiotics.  Patient verbalized understanding wishes to proceed.  We will plan for surgery next week  25 minutes of face to face time were spent with the patient. >50% of this was spent on counseling and coordination of care. Specifically discussed with patient the above diagnoses and overall treatment plan.   No follow-ups on file.

## 2019-05-17 ENCOUNTER — Telehealth: Payer: Self-pay | Admitting: Podiatry

## 2019-05-17 NOTE — Telephone Encounter (Signed)
This is Blanch Media at [facility]. We need an H&P on pt who is scheduled for tomorrow. Please get that sent to [place]. Thanks.

## 2019-05-17 NOTE — Telephone Encounter (Signed)
Blanch Media with [facility] called requesting patients H&P be faxed directly to her at [phone]. Stated she needs it before patients surgery tomorrow.

## 2019-05-18 ENCOUNTER — Other Ambulatory Visit: Payer: Self-pay | Admitting: Podiatry

## 2019-05-18 ENCOUNTER — Encounter: Payer: Self-pay | Admitting: Podiatry

## 2019-05-18 DIAGNOSIS — M86672 Other chronic osteomyelitis, left ankle and foot: Secondary | ICD-10-CM | POA: Diagnosis not present

## 2019-05-18 MED ORDER — DOXYCYCLINE HYCLATE 100 MG PO TABS: 100.0000 mg | ORAL_TABLET | Freq: Two times a day (BID) | ORAL | 0 refills | Status: DC

## 2019-05-18 NOTE — Progress Notes (Signed)
Refill doxycycline.

## 2019-05-19 ENCOUNTER — Telehealth: Payer: Self-pay | Admitting: *Deleted

## 2019-05-19 ENCOUNTER — Other Ambulatory Visit: Payer: Self-pay | Admitting: Podiatry

## 2019-05-19 DIAGNOSIS — L97521 Non-pressure chronic ulcer of other part of left foot limited to breakdown of skin: Secondary | ICD-10-CM

## 2019-05-19 DIAGNOSIS — M2041 Other hammer toe(s) (acquired), right foot: Secondary | ICD-10-CM

## 2019-05-19 NOTE — Telephone Encounter (Signed)
Called and left a message for the patient to call me back in the [place] office at [phone]. Lattie Haw

## 2019-05-24 ENCOUNTER — Other Ambulatory Visit: Payer: Self-pay

## 2019-05-24 ENCOUNTER — Telehealth: Payer: Self-pay | Admitting: *Deleted

## 2019-05-24 ENCOUNTER — Ambulatory Visit (INDEPENDENT_AMBULATORY_CARE_PROVIDER_SITE_OTHER): Payer: 59

## 2019-05-24 ENCOUNTER — Encounter: Payer: Self-pay | Admitting: Podiatry

## 2019-05-24 ENCOUNTER — Ambulatory Visit (INDEPENDENT_AMBULATORY_CARE_PROVIDER_SITE_OTHER): Payer: 59 | Admitting: Podiatry

## 2019-05-24 VITALS — Temp 100.6°F | Resp 16

## 2019-05-24 DIAGNOSIS — L97521 Non-pressure chronic ulcer of other part of left foot limited to breakdown of skin: Secondary | ICD-10-CM

## 2019-05-24 DIAGNOSIS — Z9889 Other specified postprocedural states: Secondary | ICD-10-CM

## 2019-05-24 NOTE — Telephone Encounter (Signed)
ERROR

## 2019-05-24 NOTE — Progress Notes (Signed)
Subjective:  Patient ID: Gloria Hodge, female    DOB: Sep 29, 1966,  MRN: [id number]  Chief Complaint  Patient presents with  . Foot Swelling    Pt. c/o Left sx foot swelling and pain since yesterday - pt states she had a fever of 100.6, and has been having chills     DOS: 05/18/2019 Procedure: Partial amputation L 4th toe  53 y.o. female returns for post-op check. Hx as above. Febrile to 100.6 today.  Review of Systems: Negative except as noted in the HPI. Denies N/V/F/Ch.  Past Medical History:  Diagnosis Date  . Calf pain   . Depression   . Diabetes ([place])   . Dizziness   . Fatigue   . Frequent headaches   . IBS (irritable bowel syndrome)   . Memory loss   . Muscle pain   . Swelling     Current Outpatient Medications:  .  ALPRAZolam (XANAX) 1 MG tablet, Take 1 mg by mouth at bedtime as needed for anxiety., Disp: , Rfl:  .  cephALEXin (KEFLEX) 500 MG capsule, Take 1 capsule (500 mg total) by mouth 2 (two) times daily., Disp: 14 capsule, Rfl: 0 .  clindamycin (CLEOCIN) 150 MG capsule, Take 1 capsule (150 mg total) by mouth 2 (two) times daily., Disp: 14 capsule, Rfl: 0 .  Continuous Blood Gluc Receiver (FREESTYLE LIBRE 14 DAY READER) DEVI, , Disp: , Rfl:  .  Continuous Blood Gluc Sensor (FREESTYLE LIBRE 14 DAY SENSOR) MISC, , Disp: , Rfl:  .  cyclobenzaprine (FLEXERIL) 10 MG tablet, Take 10 mg by mouth 3 (three) times daily as needed for muscle spasms. Patient doesn't know the mg/lc, Disp: , Rfl:  .  Dapagliflozin Propanediol (FARXIGA PO), Take by mouth., Disp: , Rfl:  .  doxycycline (MONODOX) 100 MG capsule, , Disp: , Rfl:  .  doxycycline (VIBRA-TABS) 100 MG tablet, Take 1 tablet (100 mg total) by mouth 2 (two) times daily., Disp: 20 tablet, Rfl: 0 .  fenofibrate (TRICOR) 145 MG tablet, , Disp: , Rfl:  .  furosemide (LASIX) 20 MG tablet, , Disp: , Rfl:  .  HUMALOG KWIKPEN 200 UNIT/ML SOPN, 0-70= EAT SNACK, 71-150=0 UNITS, 151-200=2 UNITS, 201-250= 4 UNITS, 251-300=6  UNITS, 301-350= 8 UNITS, 251-400= 10 UNITS, OVER 400 =15 UNITS, Disp: , Rfl:  .  LANTUS SOLOSTAR 100 UNIT/ML Solostar Pen, INJECT 50 UNITS AT BEDTIME, Disp: , Rfl: 2 .  levofloxacin (LEVAQUIN) 750 MG tablet, , Disp: , Rfl:  .  metFORMIN (GLUCOPHAGE) 1000 MG tablet, Take 1,000 mg by mouth 2 (two) times daily with a meal., Disp: , Rfl:  .  neomycin-polymyxin-hydrocortisone (CORTISPORIN) OTIC solution, Apply 2-3 drops to the ingrown toenail site twice daily. Cover with band-aid., Disp: 10 mL, Rfl: 0 .  oxyCODONE (ROXICODONE) 15 MG immediate release tablet, , Disp: , Rfl:  .  oxyCODONE-acetaminophen (PERCOCET) 10-325 MG tablet, Take 1 tablet by mouth every 4 (four) hours as needed for pain., Disp: 10 tablet, Rfl: 0 .  promethazine (PHENERGAN) 12.5 MG tablet, Take 12.5 mg by mouth every 6 (six) hours as needed for nausea or vomiting. Patient doesn't know the mg/lc, Disp: , Rfl:  .  rosuvastatin (CRESTOR) 40 MG tablet, Take 40 mg by mouth daily., Disp: , Rfl:  .  sulfamethoxazole-trimethoprim (BACTRIM DS,SEPTRA DS) 800-160 MG tablet, , Disp: , Rfl:  .  zolpidem (AMBIEN) 10 MG tablet, Take 10 mg by mouth at bedtime as needed for sleep., Disp: , Rfl:   Social History  Tobacco Use  Smoking Status Never Smoker  Smokeless Tobacco Never Used    Allergies  Allergen Reactions  . Bactrim [Sulfamethoxazole-Trimethoprim]    Objective:   Vitals:   05/24/19 1758  Resp: 16  Temp: (!) 100.6 F (38.1 C)   There is no height or weight on file to calculate BMI. Constitutional Well developed. Well nourished.  Vascular Foot warm and well perfused. Capillary refill normal to all digits.   Neurologic Normal speech. Oriented to person, place, and time. Epicritic sensation to light touch grossly present bilaterally.  Dermatologic L 4th toe amputation site with macerated granulation tissue, suture failure. Slight periwound warmth and erythema. No purulence, flutuance, crepitus Bilteral leg drug rash  eruption.  Orthopedic: Tenderness to palpation noted about the surgical site.   Radiographs: Taken and reviewed no acute fracture or dislocation no osseous erosion at amputation margin Assessment:   1. Ulcer of toe, left, limited to breakdown of skin ([place])   2. Post-operative state    Plan:  Patient was evaluated and treated and all questions answered.  S/p foot surgery left -Progressing as expected post-operatively. -XR: as above -WB Status: WBAT in surgical shoe. New shoe dispensed -Sutures: failure noted. -The surgical site does appear to have a mild  -Medications: Rx baxdela -Foot redressed. Betadine applied. Pt to apply daily and dress. -Advised to call if fever worsens, the surgical site worsens, or she has other signs of infection. Pt verbalized understanding.  No follow-ups on file.

## 2019-05-25 ENCOUNTER — Encounter: Payer: 59 | Admitting: Podiatry

## 2019-05-25 ENCOUNTER — Telehealth: Payer: Self-pay | Admitting: *Deleted

## 2019-05-25 MED ORDER — BAXDELA 450 MG PO TABS: 1.0000 | ORAL_TABLET | Freq: Two times a day (BID) | ORAL | 0 refills | Status: DC

## 2019-05-25 NOTE — Telephone Encounter (Signed)
1 Tab BID Dispense quantity 14

## 2019-05-25 NOTE — Telephone Encounter (Signed)
-----   Message from Evelina Bucy, DPM sent at 05/24/2019  5:13 PM EDT ----- Can we send her an rx for [place] for 5 days? To that special pharmacy?

## 2019-05-27 NOTE — Telephone Encounter (Signed)
Pt. Called stating her surgical foot is black, has more swelling, discolored and with an increase of drainage. Pt. States she's been having fevers of >100 and chills. Pt. States she is currently only taking pain meds. Please advise

## 2019-05-27 NOTE — Telephone Encounter (Signed)
Will send patient to ED and follow up when she is likely admitted.

## 2019-05-29 DIAGNOSIS — R21 Rash and other nonspecific skin eruption: Secondary | ICD-10-CM

## 2019-05-29 DIAGNOSIS — Z01818 Encounter for other preprocedural examination: Secondary | ICD-10-CM | POA: Diagnosis not present

## 2019-05-29 DIAGNOSIS — L03116 Cellulitis of left lower limb: Secondary | ICD-10-CM | POA: Diagnosis not present

## 2019-05-29 DIAGNOSIS — L02612 Cutaneous abscess of left foot: Secondary | ICD-10-CM

## 2019-05-29 DIAGNOSIS — E876 Hypokalemia: Secondary | ICD-10-CM | POA: Diagnosis not present

## 2019-05-30 DIAGNOSIS — E876 Hypokalemia: Secondary | ICD-10-CM | POA: Diagnosis not present

## 2019-05-30 DIAGNOSIS — L03116 Cellulitis of left lower limb: Secondary | ICD-10-CM | POA: Diagnosis not present

## 2019-05-30 DIAGNOSIS — L02612 Cutaneous abscess of left foot: Secondary | ICD-10-CM | POA: Diagnosis not present

## 2019-05-30 DIAGNOSIS — R21 Rash and other nonspecific skin eruption: Secondary | ICD-10-CM | POA: Diagnosis not present

## 2019-05-31 DIAGNOSIS — L03116 Cellulitis of left lower limb: Secondary | ICD-10-CM | POA: Diagnosis not present

## 2019-05-31 DIAGNOSIS — L02612 Cutaneous abscess of left foot: Secondary | ICD-10-CM | POA: Diagnosis not present

## 2019-05-31 DIAGNOSIS — E876 Hypokalemia: Secondary | ICD-10-CM | POA: Diagnosis not present

## 2019-05-31 DIAGNOSIS — R21 Rash and other nonspecific skin eruption: Secondary | ICD-10-CM | POA: Diagnosis not present

## 2019-06-01 ENCOUNTER — Other Ambulatory Visit: Payer: Self-pay

## 2019-06-01 ENCOUNTER — Ambulatory Visit (INDEPENDENT_AMBULATORY_CARE_PROVIDER_SITE_OTHER): Payer: 59 | Admitting: Podiatry

## 2019-06-01 DIAGNOSIS — E876 Hypokalemia: Secondary | ICD-10-CM | POA: Diagnosis not present

## 2019-06-01 DIAGNOSIS — L02612 Cutaneous abscess of left foot: Secondary | ICD-10-CM | POA: Diagnosis not present

## 2019-06-01 DIAGNOSIS — R21 Rash and other nonspecific skin eruption: Secondary | ICD-10-CM | POA: Diagnosis not present

## 2019-06-01 DIAGNOSIS — L03116 Cellulitis of left lower limb: Secondary | ICD-10-CM | POA: Diagnosis not present

## 2019-06-01 DIAGNOSIS — Z5329 Procedure and treatment not carried out because of patient's decision for other reasons: Secondary | ICD-10-CM

## 2019-06-01 NOTE — Progress Notes (Signed)
Patient hospitalized

## 2019-06-02 DIAGNOSIS — L02612 Cutaneous abscess of left foot: Secondary | ICD-10-CM | POA: Diagnosis not present

## 2019-06-02 DIAGNOSIS — L03116 Cellulitis of left lower limb: Secondary | ICD-10-CM | POA: Diagnosis not present

## 2019-06-02 DIAGNOSIS — E876 Hypokalemia: Secondary | ICD-10-CM | POA: Diagnosis not present

## 2019-06-02 DIAGNOSIS — R21 Rash and other nonspecific skin eruption: Secondary | ICD-10-CM | POA: Diagnosis not present

## 2019-06-03 DIAGNOSIS — L02612 Cutaneous abscess of left foot: Secondary | ICD-10-CM | POA: Diagnosis not present

## 2019-06-03 DIAGNOSIS — E876 Hypokalemia: Secondary | ICD-10-CM | POA: Diagnosis not present

## 2019-06-03 DIAGNOSIS — R21 Rash and other nonspecific skin eruption: Secondary | ICD-10-CM | POA: Diagnosis not present

## 2019-06-03 DIAGNOSIS — L03116 Cellulitis of left lower limb: Secondary | ICD-10-CM | POA: Diagnosis not present

## 2019-06-04 DIAGNOSIS — L03116 Cellulitis of left lower limb: Secondary | ICD-10-CM | POA: Diagnosis not present

## 2019-06-04 DIAGNOSIS — L02612 Cutaneous abscess of left foot: Secondary | ICD-10-CM | POA: Diagnosis not present

## 2019-06-04 DIAGNOSIS — E876 Hypokalemia: Secondary | ICD-10-CM | POA: Diagnosis not present

## 2019-06-04 DIAGNOSIS — R21 Rash and other nonspecific skin eruption: Secondary | ICD-10-CM | POA: Diagnosis not present

## 2019-06-05 DIAGNOSIS — L03116 Cellulitis of left lower limb: Secondary | ICD-10-CM | POA: Diagnosis not present

## 2019-06-05 DIAGNOSIS — L02612 Cutaneous abscess of left foot: Secondary | ICD-10-CM | POA: Diagnosis not present

## 2019-06-05 DIAGNOSIS — R21 Rash and other nonspecific skin eruption: Secondary | ICD-10-CM | POA: Diagnosis not present

## 2019-06-05 DIAGNOSIS — E876 Hypokalemia: Secondary | ICD-10-CM | POA: Diagnosis not present

## 2019-06-07 ENCOUNTER — Ambulatory Visit (INDEPENDENT_AMBULATORY_CARE_PROVIDER_SITE_OTHER): Payer: 59

## 2019-06-07 ENCOUNTER — Telehealth: Payer: Self-pay | Admitting: *Deleted

## 2019-06-07 ENCOUNTER — Ambulatory Visit (INDEPENDENT_AMBULATORY_CARE_PROVIDER_SITE_OTHER): Payer: Self-pay | Admitting: Podiatry

## 2019-06-07 ENCOUNTER — Other Ambulatory Visit: Payer: Self-pay | Admitting: Podiatry

## 2019-06-07 ENCOUNTER — Encounter: Payer: Self-pay | Admitting: Podiatry

## 2019-06-07 ENCOUNTER — Other Ambulatory Visit: Payer: Self-pay

## 2019-06-07 VITALS — Temp 99.9°F | Resp 16

## 2019-06-07 DIAGNOSIS — Z9889 Other specified postprocedural states: Secondary | ICD-10-CM

## 2019-06-14 ENCOUNTER — Other Ambulatory Visit: Payer: Self-pay

## 2019-06-14 ENCOUNTER — Encounter: Payer: Self-pay | Admitting: Podiatry

## 2019-06-14 ENCOUNTER — Ambulatory Visit (INDEPENDENT_AMBULATORY_CARE_PROVIDER_SITE_OTHER): Payer: 59 | Admitting: Podiatry

## 2019-06-14 VITALS — Temp 99.2°F | Resp 16

## 2019-06-14 DIAGNOSIS — L97521 Non-pressure chronic ulcer of other part of left foot limited to breakdown of skin: Secondary | ICD-10-CM

## 2019-06-14 NOTE — Progress Notes (Signed)
Subjective:  Patient ID: Gloria Hodge, female    DOB: 12/12/1965,  MRN: [id number]  Chief Complaint  Patient presents with  . Wound Check    F/U wound check Pt. states," seems to bet gettin better, just weak and tired." -pt states she still has a 10/10 pain on the bottom of her foot Tx: sx shoe and oxy -w/ less swellign -pt denies drainage/redness   53 y.o. female returns for post-op check. Hx as above.  Review of Systems: Negative except as noted in the HPI. Denies N/V/F/Ch.  Past Medical History:  Diagnosis Date  . Calf pain   . Depression   . Diabetes ([place])   . Dizziness   . Fatigue   . Frequent headaches   . IBS (irritable bowel syndrome)   . Memory loss   . Muscle pain   . Swelling     Current Outpatient Medications:  .  ALPRAZolam (XANAX) 1 MG tablet, Take 1 mg by mouth at bedtime as needed for anxiety., Disp: , Rfl:  .  cephALEXin (KEFLEX) 500 MG capsule, Take 1 capsule (500 mg total) by mouth 2 (two) times daily., Disp: 14 capsule, Rfl: 0 .  clindamycin (CLEOCIN) 150 MG capsule, Take 1 capsule (150 mg total) by mouth 2 (two) times daily., Disp: 14 capsule, Rfl: 0 .  Continuous Blood Gluc Receiver (FREESTYLE LIBRE 14 DAY READER) DEVI, , Disp: , Rfl:  .  Continuous Blood Gluc Sensor (FREESTYLE LIBRE 14 DAY SENSOR) MISC, , Disp: , Rfl:  .  cyclobenzaprine (FLEXERIL) 10 MG tablet, Take 10 mg by mouth 3 (three) times daily as needed for muscle spasms. Patient doesn't know the mg/lc, Disp: , Rfl:  .  Dapagliflozin Propanediol (FARXIGA PO), Take by mouth., Disp: , Rfl:  .  Delafloxacin Meglumine (BAXDELA) 450 MG TABS, Take 1 tablet by mouth 2 (two) times daily., Disp: 14 tablet, Rfl: 0 .  doxycycline (MONODOX) 100 MG capsule, , Disp: , Rfl:  .  doxycycline (VIBRA-TABS) 100 MG tablet, Take 1 tablet (100 mg total) by mouth 2 (two) times daily., Disp: 20 tablet, Rfl: 0 .  fenofibrate (TRICOR) 145 MG tablet, , Disp: , Rfl:  .  furosemide (LASIX) 20 MG tablet, , Disp: , Rfl:   .  HUMALOG KWIKPEN 200 UNIT/ML SOPN, 0-70= EAT SNACK, 71-150=0 UNITS, 151-200=2 UNITS, 201-250= 4 UNITS, 251-300=6 UNITS, 301-350= 8 UNITS, 251-400= 10 UNITS, OVER 400 =15 UNITS, Disp: , Rfl:  .  LANTUS SOLOSTAR 100 UNIT/ML Solostar Pen, INJECT 50 UNITS AT BEDTIME, Disp: , Rfl: 2 .  levofloxacin (LEVAQUIN) 750 MG tablet, , Disp: , Rfl:  .  metFORMIN (GLUCOPHAGE) 1000 MG tablet, Take 1,000 mg by mouth 2 (two) times daily with a meal., Disp: , Rfl:  .  neomycin-polymyxin-hydrocortisone (CORTISPORIN) OTIC solution, Apply 2-3 drops to the ingrown toenail site twice daily. Cover with band-aid., Disp: 10 mL, Rfl: 0 .  oxyCODONE (ROXICODONE) 15 MG immediate release tablet, , Disp: , Rfl:  .  oxyCODONE-acetaminophen (PERCOCET) 10-325 MG tablet, Take 1 tablet by mouth every 4 (four) hours as needed for pain., Disp: 10 tablet, Rfl: 0 .  promethazine (PHENERGAN) 12.5 MG tablet, Take 12.5 mg by mouth every 6 (six) hours as needed for nausea or vomiting. Patient doesn't know the mg/lc, Disp: , Rfl:  .  rosuvastatin (CRESTOR) 40 MG tablet, Take 40 mg by mouth daily., Disp: , Rfl:  .  sulfamethoxazole-trimethoprim (BACTRIM DS,SEPTRA DS) 800-160 MG tablet, , Disp: , Rfl:  .  triamcinolone ointment (KENALOG)  0.1 %, APP EXT AA BID, Disp: , Rfl:  .  zolpidem (AMBIEN) 10 MG tablet, Take 10 mg by mouth at bedtime as needed for sleep., Disp: , Rfl:   Social History   Tobacco Use  Smoking Status Never Smoker  Smokeless Tobacco Never Used    Allergies  Allergen Reactions  . Bactrim [Sulfamethoxazole-Trimethoprim]    Objective:   Vitals:   06/14/19 1617  Resp: 16  Temp: 99.2 F (37.3 C)   There is no height or weight on file to calculate BMI. Constitutional Well developed. Well nourished.  Vascular Foot warm and well perfused. Capillary refill normal to all digits.   Neurologic Normal speech. Oriented to person, place, and time. Epicritic sensation to light touch grossly present bilaterally.   Dermatologic Skin healing well without signs of infection. Wound macerated but no purulence. No crepitus. No fluctuance.  Orthopedic: Tenderness to palpation noted about the surgical site.   Radiographs: None Assessment:   1. Ulcer of toe, left, limited to breakdown of skin [facility])    Plan:  Patient was evaluated and treated and all questions answered.  S/p foot surgery left -Progressing as expected post-operatively. -XR: None -WB Status: WBAt in surgical shoe -Sutures: intact. -Medications: none refilled. Abx dosing per pharmacist -Foot redressed.  No follow-ups on file.

## 2019-06-25 ENCOUNTER — Ambulatory Visit (INDEPENDENT_AMBULATORY_CARE_PROVIDER_SITE_OTHER): Payer: 59 | Admitting: Sports Medicine

## 2019-06-25 ENCOUNTER — Other Ambulatory Visit: Payer: Self-pay

## 2019-06-25 ENCOUNTER — Telehealth: Payer: Self-pay | Admitting: Sports Medicine

## 2019-06-25 DIAGNOSIS — E1142 Type 2 diabetes mellitus with diabetic polyneuropathy: Secondary | ICD-10-CM

## 2019-06-25 DIAGNOSIS — Z9889 Other specified postprocedural states: Secondary | ICD-10-CM

## 2019-06-25 NOTE — Telephone Encounter (Signed)
Patient has an appointment today with Dr. Cannon Kettle in [place], Gloria Hodge  Is a Dr. March Rummage patient. Gloria Hodge was calling about her pick line being removed today. Gloria Hodge stated that Gloria Hodge wanted to know who would remove it, either the nurse aid that comes to her house or the provider in [place]. Val stated that it will be discuss in her appointment today

## 2019-06-25 NOTE — Telephone Encounter (Signed)
Ok. Thanks!

## 2019-06-26 ENCOUNTER — Encounter: Payer: Self-pay | Admitting: Sports Medicine

## 2019-06-26 NOTE — Progress Notes (Signed)
Subjective: Gloria Hodge is a 52 y.o. female patient seen in office for evaluation of left surgical foot.  Patient is status post amputation performed by Dr. March Rummage.  Patient reports that she has been assisting with changing dressing using Betadine and dry dressing to the area and is currently taking antibiotics without any major issues.  Patient reports that she feels like the area is getting better surgical shoe is helping some pain 8 out of 10 but otherwise denies any  other pedal complaints at this time.  Fasting blood sugar over 200  There are no active problems to display for this patient.  Current Outpatient Medications on File Prior to Visit  Medication Sig Dispense Refill  . ALPRAZolam (XANAX) 1 MG tablet Take 1 mg by mouth at bedtime as needed for anxiety.    . cephALEXin (KEFLEX) 500 MG capsule Take 1 capsule (500 mg total) by mouth 2 (two) times daily. 14 capsule 0  . clindamycin (CLEOCIN) 150 MG capsule Take 1 capsule (150 mg total) by mouth 2 (two) times daily. 14 capsule 0  . Continuous Blood Gluc Receiver (FREESTYLE LIBRE 14 DAY READER) DEVI     . Continuous Blood Gluc Sensor (FREESTYLE LIBRE 14 DAY SENSOR) MISC     . cyclobenzaprine (FLEXERIL) 10 MG tablet Take 10 mg by mouth 3 (three) times daily as needed for muscle spasms. Patient doesn't know the mg/lc    . Dapagliflozin Propanediol (FARXIGA PO) Take by mouth.    . Delafloxacin Meglumine (BAXDELA) 450 MG TABS Take 1 tablet by mouth 2 (two) times daily. 14 tablet 0  . doxycycline (MONODOX) 100 MG capsule     . doxycycline (VIBRA-TABS) 100 MG tablet Take 1 tablet (100 mg total) by mouth 2 (two) times daily. 20 tablet 0  . fenofibrate (TRICOR) 145 MG tablet     . furosemide (LASIX) 20 MG tablet     . HUMALOG KWIKPEN 200 UNIT/ML SOPN 0-70= EAT SNACK, 71-150=0 UNITS, 151-200=2 UNITS, 201-250= 4 UNITS, 251-300=6 UNITS, 301-350= 8 UNITS, 251-400= 10 UNITS, OVER 400 =15 UNITS    . LANTUS SOLOSTAR 100 UNIT/ML Solostar Pen INJECT  50 UNITS AT BEDTIME  2  . levofloxacin (LEVAQUIN) 750 MG tablet     . metFORMIN (GLUCOPHAGE) 1000 MG tablet Take 1,000 mg by mouth 2 (two) times daily with a meal.    . neomycin-polymyxin-hydrocortisone (CORTISPORIN) OTIC solution Apply 2-3 drops to the ingrown toenail site twice daily. Cover with band-aid. 10 mL 0  . oxyCODONE (ROXICODONE) 15 MG immediate release tablet     . oxyCODONE-acetaminophen (PERCOCET) 10-325 MG tablet Take 1 tablet by mouth every 4 (four) hours as needed for pain. 10 tablet 0  . promethazine (PHENERGAN) 12.5 MG tablet Take 12.5 mg by mouth every 6 (six) hours as needed for nausea or vomiting. Patient doesn't know the mg/lc    . rosuvastatin (CRESTOR) 40 MG tablet Take 40 mg by mouth daily.    Marland Kitchen sulfamethoxazole-trimethoprim (BACTRIM DS,SEPTRA DS) 800-160 MG tablet     . triamcinolone ointment (KENALOG) 0.1 % APP EXT AA BID    . zolpidem (AMBIEN) 10 MG tablet Take 10 mg by mouth at bedtime as needed for sleep.     No current facility-administered medications on file prior to visit.    Allergies  Allergen Reactions  . Bactrim [Sulfamethoxazole-Trimethoprim]     No results found for this or any previous visit (from the past 2160 hour(s)).  Objective: There were no vitals filed for this visit.  General: Patient is awake, alert, oriented x 3 and in no acute distress.  Dermatology: Skin is warm and dry bilateral with a macerated amputation wound site on the left foot with staples and sutures intact no active drainage no warmth no redness.  Other old surgical scars well-healed.  Vascular: Dorsalis Pedis pulse = 1/4 Bilateral,  Posterior Tibial pulse = 1/4 Bilateral,  Capillary Fill Time < 5 seconds  Neurologic: Protective sensation absent bilateral.  Musculosketal: Status post previous amputation bilateral.  No results for input(s): GRAMSTAIN, LABORGA in the last 8760 hours.  Assessment and Plan:  Problem List Items Addressed This Visit    None    Visit  Diagnoses    Post-operative state    -  Primary   DM type 2 with diabetic peripheral neuropathy (HCC)         -Examined patient -Applied Betadine and dry dressing to surgical site and advised patient to continue with the same as previously instructed by Dr. March Rummage -Continue with weightbearing in surgical shoe -Continue with antibiotics until completed -Patient to return to office as scheduled with continued postoperative care with Dr. March Rummage or sooner if problems arise.  Landis Martins, DPM

## 2019-06-28 ENCOUNTER — Ambulatory Visit (INDEPENDENT_AMBULATORY_CARE_PROVIDER_SITE_OTHER): Payer: 59 | Admitting: Podiatry

## 2019-06-28 ENCOUNTER — Other Ambulatory Visit: Payer: Self-pay

## 2019-06-28 DIAGNOSIS — Z9889 Other specified postprocedural states: Secondary | ICD-10-CM

## 2019-06-28 NOTE — Telephone Encounter (Signed)
Called and spoke to [place] patients [facility] RN and gave orders for PICC line to be removed.  Verbal order was acceptable per [place] [phone]

## 2019-06-28 NOTE — Progress Notes (Signed)
Subjective:  Patient ID: Gloria Hodge, female    DOB: [birth year],  MRN: [id number]  Chief Complaint  Patient presents with  . Foot Pain    pt is here for a f/u on ulcer of left foot, pt states that she is feeling the same since her last visit, pt also states that she has no current comments or concerns   53 y.o. female returns for post-op check. Hx as above.  Review of Systems: Negative except as noted in the HPI. Denies N/V/F/Ch.  Past Medical History:  Diagnosis Date  . Calf pain   . Depression   . Diabetes ([place])   . Dizziness   . Fatigue   . Frequent headaches   . IBS (irritable bowel syndrome)   . Memory loss   . Muscle pain   . Swelling     Current Outpatient Medications:  .  ALPRAZolam (XANAX) 1 MG tablet, Take 1 mg by mouth at bedtime as needed for anxiety., Disp: , Rfl:  .  cephALEXin (KEFLEX) 500 MG capsule, Take 1 capsule (500 mg total) by mouth 2 (two) times daily., Disp: 14 capsule, Rfl: 0 .  clindamycin (CLEOCIN) 150 MG capsule, Take 1 capsule (150 mg total) by mouth 2 (two) times daily., Disp: 14 capsule, Rfl: 0 .  Continuous Blood Gluc Receiver (FREESTYLE LIBRE 14 DAY READER) DEVI, , Disp: , Rfl:  .  Continuous Blood Gluc Sensor (FREESTYLE LIBRE 14 DAY SENSOR) MISC, , Disp: , Rfl:  .  cyclobenzaprine (FLEXERIL) 10 MG tablet, Take 10 mg by mouth 3 (three) times daily as needed for muscle spasms. Patient doesn't know the mg/lc, Disp: , Rfl:  .  Dapagliflozin Propanediol (FARXIGA PO), Take by mouth., Disp: , Rfl:  .  Delafloxacin Meglumine (BAXDELA) 450 MG TABS, Take 1 tablet by mouth 2 (two) times daily., Disp: 14 tablet, Rfl: 0 .  doxycycline (MONODOX) 100 MG capsule, , Disp: , Rfl:  .  doxycycline (VIBRA-TABS) 100 MG tablet, Take 1 tablet (100 mg total) by mouth 2 (two) times daily., Disp: 20 tablet, Rfl: 0 .  fenofibrate (TRICOR) 145 MG tablet, , Disp: , Rfl:  .  furosemide (LASIX) 20 MG tablet, , Disp: , Rfl:  .  HUMALOG KWIKPEN 200 UNIT/ML SOPN, 0-70=  EAT SNACK, 71-150=0 UNITS, 151-200=2 UNITS, 201-250= 4 UNITS, 251-300=6 UNITS, 301-350= 8 UNITS, 251-400= 10 UNITS, OVER 400 =15 UNITS, Disp: , Rfl:  .  LANTUS SOLOSTAR 100 UNIT/ML Solostar Pen, INJECT 50 UNITS AT BEDTIME, Disp: , Rfl: 2 .  levofloxacin (LEVAQUIN) 750 MG tablet, , Disp: , Rfl:  .  metFORMIN (GLUCOPHAGE) 1000 MG tablet, Take 1,000 mg by mouth 2 (two) times daily with a meal., Disp: , Rfl:  .  neomycin-polymyxin-hydrocortisone (CORTISPORIN) OTIC solution, Apply 2-3 drops to the ingrown toenail site twice daily. Cover with band-aid., Disp: 10 mL, Rfl: 0 .  oxyCODONE (ROXICODONE) 15 MG immediate release tablet, , Disp: , Rfl:  .  oxyCODONE-acetaminophen (PERCOCET) 10-325 MG tablet, Take 1 tablet by mouth every 4 (four) hours as needed for pain., Disp: 10 tablet, Rfl: 0 .  promethazine (PHENERGAN) 12.5 MG tablet, Take 12.5 mg by mouth every 6 (six) hours as needed for nausea or vomiting. Patient doesn't know the mg/lc, Disp: , Rfl:  .  rosuvastatin (CRESTOR) 40 MG tablet, Take 40 mg by mouth daily., Disp: , Rfl:  .  sulfamethoxazole-trimethoprim (BACTRIM DS,SEPTRA DS) 800-160 MG tablet, , Disp: , Rfl:  .  triamcinolone ointment (KENALOG) 0.1 %, APP EXT AA  BID, Disp: , Rfl:  .  zolpidem (AMBIEN) 10 MG tablet, Take 10 mg by mouth at bedtime as needed for sleep., Disp: , Rfl:   Social History   Tobacco Use  Smoking Status Never Smoker  Smokeless Tobacco Never Used    Allergies  Allergen Reactions  . Bactrim [Sulfamethoxazole-Trimethoprim]    Objective:   There were no vitals filed for this visit. There is no height or weight on file to calculate BMI. Constitutional Well developed. Well nourished.  Vascular Foot warm and well perfused. Capillary refill normal to all digits.   Neurologic Normal speech. Oriented to person, place, and time. Epicritic sensation to light touch grossly present bilaterally.  Dermatologic Skin healing well without signs of infection. Wound  macerated but no purulence. Small dehiscence with deep probing but not to bone.  Orthopedic: Tenderness to palpation noted about the surgical site.   Radiographs: None Assessment:   1. Post-operative state    Plan:  Patient was evaluated and treated and all questions answered.  S/p foot surgery left -XR: None -WB Status: WBAt in surgical shoe -Sutures: intact. -Small open area packed with prisma -Medications: none refilled. Ok to pull PICC line. Orders given to [facility]. -Foot redressed.  No follow-ups on file.

## 2019-06-28 NOTE — Telephone Encounter (Signed)
Please send order to pull PICC

## 2019-07-06 ENCOUNTER — Ambulatory Visit (INDEPENDENT_AMBULATORY_CARE_PROVIDER_SITE_OTHER): Payer: 59 | Admitting: Podiatry

## 2019-07-06 DIAGNOSIS — Z5329 Procedure and treatment not carried out because of patient's decision for other reasons: Secondary | ICD-10-CM

## 2019-07-06 NOTE — Progress Notes (Signed)
No show for post-op appt. Staff attempted to call patient no answer.

## 2019-07-07 NOTE — Telephone Encounter (Signed)
Entered in error

## 2019-07-13 ENCOUNTER — Ambulatory Visit (INDEPENDENT_AMBULATORY_CARE_PROVIDER_SITE_OTHER): Payer: Self-pay | Admitting: Podiatry

## 2019-07-13 ENCOUNTER — Other Ambulatory Visit: Payer: Self-pay

## 2019-07-13 DIAGNOSIS — Z9889 Other specified postprocedural states: Secondary | ICD-10-CM

## 2019-07-13 NOTE — Progress Notes (Signed)
Subjective:  Patient ID: Gloria Hodge, female    DOB: 1966-03-31,  MRN: [id number]  No chief complaint on file.  53 y.o. female returns for post-op check. Beem doing really good, healing up well. Still hurting ben cleaning it but denies drainage redness and swelling. Using neosporin.  Review of Systems: Negative except as noted in the HPI. Denies N/V/F/Ch.  Past Medical History:  Diagnosis Date  . Calf pain   . Depression   . Diabetes ([place])   . Dizziness   . Fatigue   . Frequent headaches   . IBS (irritable bowel syndrome)   . Memory loss   . Muscle pain   . Swelling     Current Outpatient Medications:  .  ALPRAZolam (XANAX) 1 MG tablet, Take 1 mg by mouth at bedtime as needed for anxiety., Disp: , Rfl:  .  cephALEXin (KEFLEX) 500 MG capsule, Take 1 capsule (500 mg total) by mouth 2 (two) times daily., Disp: 14 capsule, Rfl: 0 .  clindamycin (CLEOCIN) 150 MG capsule, Take 1 capsule (150 mg total) by mouth 2 (two) times daily., Disp: 14 capsule, Rfl: 0 .  Continuous Blood Gluc Receiver (FREESTYLE LIBRE 14 DAY READER) DEVI, , Disp: , Rfl:  .  Continuous Blood Gluc Sensor (FREESTYLE LIBRE 14 DAY SENSOR) MISC, , Disp: , Rfl:  .  cyclobenzaprine (FLEXERIL) 10 MG tablet, Take 10 mg by mouth 3 (three) times daily as needed for muscle spasms. Patient doesn't know the mg/lc, Disp: , Rfl:  .  Dapagliflozin Propanediol (FARXIGA PO), Take by mouth., Disp: , Rfl:  .  Delafloxacin Meglumine (BAXDELA) 450 MG TABS, Take 1 tablet by mouth 2 (two) times daily., Disp: 14 tablet, Rfl: 0 .  doxycycline (MONODOX) 100 MG capsule, , Disp: , Rfl:  .  doxycycline (VIBRA-TABS) 100 MG tablet, Take 1 tablet (100 mg total) by mouth 2 (two) times daily., Disp: 20 tablet, Rfl: 0 .  fenofibrate (TRICOR) 145 MG tablet, , Disp: , Rfl:  .  furosemide (LASIX) 20 MG tablet, , Disp: , Rfl:  .  HUMALOG KWIKPEN 200 UNIT/ML SOPN, 0-70= EAT SNACK, 71-150=0 UNITS, 151-200=2 UNITS, 201-250= 4 UNITS, 251-300=6 UNITS,  301-350= 8 UNITS, 251-400= 10 UNITS, OVER 400 =15 UNITS, Disp: , Rfl:  .  LANTUS SOLOSTAR 100 UNIT/ML Solostar Pen, INJECT 50 UNITS AT BEDTIME, Disp: , Rfl: 2 .  levofloxacin (LEVAQUIN) 750 MG tablet, , Disp: , Rfl:  .  metFORMIN (GLUCOPHAGE) 1000 MG tablet, Take 1,000 mg by mouth 2 (two) times daily with a meal., Disp: , Rfl:  .  neomycin-polymyxin-hydrocortisone (CORTISPORIN) OTIC solution, Apply 2-3 drops to the ingrown toenail site twice daily. Cover with band-aid., Disp: 10 mL, Rfl: 0 .  oxyCODONE (ROXICODONE) 15 MG immediate release tablet, , Disp: , Rfl:  .  oxyCODONE-acetaminophen (PERCOCET) 10-325 MG tablet, Take 1 tablet by mouth every 4 (four) hours as needed for pain., Disp: 10 tablet, Rfl: 0 .  promethazine (PHENERGAN) 12.5 MG tablet, Take 12.5 mg by mouth every 6 (six) hours as needed for nausea or vomiting. Patient doesn't know the mg/lc, Disp: , Rfl:  .  rosuvastatin (CRESTOR) 40 MG tablet, Take 40 mg by mouth daily., Disp: , Rfl:  .  sulfamethoxazole-trimethoprim (BACTRIM DS,SEPTRA DS) 800-160 MG tablet, , Disp: , Rfl:  .  triamcinolone ointment (KENALOG) 0.1 %, APP EXT AA BID, Disp: , Rfl:  .  zolpidem (AMBIEN) 10 MG tablet, Take 10 mg by mouth at bedtime as needed for sleep., Disp: , Rfl:  Social History   Tobacco Use  Smoking Status Never Smoker  Smokeless Tobacco Never Used    Allergies  Allergen Reactions  . Bactrim [Sulfamethoxazole-Trimethoprim]    Objective:   There were no vitals filed for this visit. There is no height or weight on file to calculate BMI. Constitutional Well developed. Well nourished.  Vascular Foot warm and well perfused. Capillary refill normal to all digits.   Neurologic Normal speech. Oriented to person, place, and time. Epicritic sensation to light touch grossly present bilaterally.  Dermatologic Skin healing well without signs of infection. Small dehiscence distally No warmth erythema signs of infection.   Orthopedic: Tenderness to  palpation noted about the surgical site.   Radiographs: None Assessment:   1. Post-operative state    Plan:  Patient was evaluated and treated and all questions answered.  S/p foot surgery left -XR: None -WB Status: WBAt in surgical shoe -Sutures: removed. -Small open area dressed with betadine. Foot redressed. -Will plan for wound closure next visit in office.  Return in about 1 week (around 07/20/2019).

## 2019-07-19 ENCOUNTER — Ambulatory Visit (INDEPENDENT_AMBULATORY_CARE_PROVIDER_SITE_OTHER): Payer: Self-pay | Admitting: Podiatry

## 2019-07-19 ENCOUNTER — Other Ambulatory Visit: Payer: Self-pay

## 2019-07-19 DIAGNOSIS — L97521 Non-pressure chronic ulcer of other part of left foot limited to breakdown of skin: Secondary | ICD-10-CM

## 2019-07-19 DIAGNOSIS — Z9889 Other specified postprocedural states: Secondary | ICD-10-CM

## 2019-07-27 ENCOUNTER — Other Ambulatory Visit: Payer: Self-pay

## 2019-07-27 ENCOUNTER — Ambulatory Visit (INDEPENDENT_AMBULATORY_CARE_PROVIDER_SITE_OTHER): Payer: Self-pay | Admitting: Podiatry

## 2019-07-27 DIAGNOSIS — Z9889 Other specified postprocedural states: Secondary | ICD-10-CM

## 2019-07-28 NOTE — Progress Notes (Signed)
Subjective:  Patient ID: Gloria Hodge, female    DOB: 1966/03/16,  MRN: [id number]  Chief Complaint  Patient presents with  . Routine Post Op    POV#1 Pt. 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 female returns for post-op check. Hx as above.    Review of Systems: Negative except as noted in the HPI. Denies N/V/F/Ch.  Past Medical History:  Diagnosis Date  . Calf pain   . Depression   . Diabetes ([place])   . Dizziness   . Fatigue   . Frequent headaches   . IBS (irritable bowel syndrome)   . Memory loss   . Muscle pain   . Swelling     Current Outpatient Medications:  .  ALPRAZolam (XANAX) 1 MG tablet, Take 1 mg by mouth at bedtime as needed for anxiety., Disp: , Rfl:  .  cephALEXin (KEFLEX) 500 MG capsule, Take 1 capsule (500 mg total) by mouth 2 (two) times daily., Disp: 14 capsule, Rfl: 0 .  clindamycin (CLEOCIN) 150 MG capsule, Take 1 capsule (150 mg total) by mouth 2 (two) times daily., Disp: 14 capsule, Rfl: 0 .  Continuous Blood Gluc Receiver (FREESTYLE LIBRE 14 DAY READER) DEVI, , Disp: , Rfl:  .  Continuous Blood Gluc Sensor (FREESTYLE LIBRE 14 DAY SENSOR) MISC, , Disp: , Rfl:  .  cyclobenzaprine (FLEXERIL) 10 MG tablet, Take 10 mg by mouth 3 (three) times daily as needed for muscle spasms. Patient doesn't know the mg/lc, Disp: , Rfl:  .  Dapagliflozin Propanediol (FARXIGA PO), Take by mouth., Disp: , Rfl:  .  Delafloxacin Meglumine (BAXDELA) 450 MG TABS, Take 1 tablet by mouth 2 (two) times daily., Disp: 14 tablet, Rfl: 0 .  doxycycline (MONODOX) 100 MG capsule, , Disp: , Rfl:  .  doxycycline (VIBRA-TABS) 100 MG tablet, Take 1 tablet (100 mg total) by mouth 2 (two) times daily., Disp: 20 tablet, Rfl: 0 .  fenofibrate (TRICOR) 145  MG tablet, , Disp: , Rfl:  .  furosemide (LASIX) 20 MG tablet, , Disp: , Rfl:  .  HUMALOG KWIKPEN 200 UNIT/ML SOPN, 0-70= EAT SNACK, 71-150=0 UNITS, 151-200=2 UNITS, 201-250= 4 UNITS, 251-300=6 UNITS, 301-350= 8 UNITS, 251-400= 10 UNITS, OVER 400 =15 UNITS, Disp: , Rfl:  .  LANTUS SOLOSTAR 100 UNIT/ML Solostar Pen, INJECT 50 UNITS AT BEDTIME, Disp: , Rfl: 2 .  levofloxacin (LEVAQUIN) 750 MG tablet, , Disp: , Rfl:  .  metFORMIN (GLUCOPHAGE) 1000 MG tablet, Take 1,000 mg by mouth 2 (two) times daily with a meal., Disp: , Rfl:  .  neomycin-polymyxin-hydrocortisone (CORTISPORIN) OTIC solution, Apply 2-3 drops to the ingrown toenail site twice daily. Cover with band-aid., Disp: 10 mL, Rfl: 0 .  oxyCODONE (ROXICODONE) 15 MG immediate release tablet, , Disp: , Rfl:  .  oxyCODONE-acetaminophen (PERCOCET) 10-325 MG tablet, Take 1 tablet by mouth every 4 (four) hours as needed for pain., Disp: 10 tablet, Rfl: 0 .  promethazine (PHENERGAN) 12.5 MG tablet, Take 12.5 mg by mouth every 6 (six) hours as needed for nausea or vomiting. Patient doesn't know the mg/lc, Disp: , Rfl:  .  rosuvastatin (CRESTOR) 40 MG tablet, Take 40 mg by mouth daily., Disp: , Rfl:  .  sulfamethoxazole-trimethoprim (BACTRIM DS,SEPTRA DS) 800-160  MG tablet, , Disp: , Rfl:  .  triamcinolone ointment (KENALOG) 0.1 %, APP EXT AA BID, Disp: , Rfl:  .  zolpidem (AMBIEN) 10 MG tablet, Take 10 mg by mouth at bedtime as needed for sleep., Disp: , Rfl:   Social History   Tobacco Use  Smoking Status Never Smoker  Smokeless Tobacco Never Used    Allergies  Allergen Reactions  . Bactrim [Sulfamethoxazole-Trimethoprim]    Objective:   Vitals:   06/07/19 1438  Resp: 16  Temp: 99.9 F (37.7 C)   There is no height or weight on file to calculate BMI. Constitutional Well developed. Well nourished.  Vascular Foot warm and well perfused. Capillary refill normal to all digits.   Neurologic Normal speech. Oriented to person, place, and  time. Epicritic sensation to light touch grossly present bilaterally.  Dermatologic L 4th toe amputation site with maceration no warmth no erythema no signs of infection  Orthopedic: Tenderness to palpation noted about the surgical site.   Radiographs: Taken and reviewed no acute fracture or dislocation no osseous erosion at amputation margin Assessment:   1. Post-operative state    Plan:  Patient was evaluated and treated and all questions answered.  S/p foot surgery left -Progressing as expected post-operatively. -XR: as above -WB Status: WBAT in surgical shoe.  -Sutures: Intact -Medications: Continue antibiotics -Foot redressed. Betadine applied.  No follow-ups on file.

## 2019-08-10 ENCOUNTER — Other Ambulatory Visit: Payer: Self-pay

## 2019-08-10 ENCOUNTER — Ambulatory Visit (INDEPENDENT_AMBULATORY_CARE_PROVIDER_SITE_OTHER): Payer: Self-pay | Admitting: Podiatry

## 2019-08-10 DIAGNOSIS — Z9889 Other specified postprocedural states: Secondary | ICD-10-CM

## 2019-08-10 NOTE — Progress Notes (Signed)
Patient presents for planned post-operative wound dehiscence repair.  Patient Name: Gloria Hodge DOB: 1966/03/06  MRN: [id number]   Date of Service: 07/19/2019  Surgeon: Dr. Hardie Pulley, DPM Assistants: None Pre-operative Diagnosis:  * No surgery found * Post-operative Diagnosis:  * No surgery found * Procedures:  1) Repair superficial dehiscence Anesthesia: Local Estimated Blood Loss: 12ml Materials: 4-0 monocryl Medications: None Complications: none  Indications for Procedure:  This is a 53 y.o. female with a superficial dehiscence to the left fourth toe.  She presents for planned closure.   Procedure in Detail: Patient was identified in pre-operative holding area. Formal consent was signed and the left 4th toe was marked.  Local anesthesia was induced. The extremity was prepped and draped in the usual sterile fashion. Timeout was performed prior to procedure. Attention was then directed to the left fourth toe.  The wound was gently debrided of nonviable tissue.  The wound was then closed with 4-0 Monocryl.  The foot was then dressed with Xeroform, 4 x 4, Kerlix, Ace bandage. Patient tolerated the procedure well.

## 2019-08-10 NOTE — Progress Notes (Signed)
Subjective:  Patient ID: Gloria Hodge, female    DOB: [birth year],  MRN: [id number]  Chief Complaint  Patient presents with  . Wound Check    F/U Lt wound check pt. states," seems like it's been doing [place]. I think it's looking better, but it's been sore on the outsie of my foot; 8/10 sharp intermittnent pains." -pt denies swelling/redness/drainag eTx; sx shoe, and neosporin   . 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 female returns for post-op check.  Hx as above. Not wearing surgical shoe today.  Review of Systems: Negative except as noted in the HPI. Denies N/V/F/Ch.  Past Medical History:  Diagnosis Date  . Calf pain   . Depression   . Diabetes ([place])   . Dizziness   . Fatigue   . Frequent headaches   . IBS (irritable bowel syndrome)   . Memory loss   . Muscle pain   . Swelling     Current Outpatient Medications:  .  ALPRAZolam (XANAX) 1 MG tablet, Take 1 mg by mouth at bedtime as needed for anxiety., Disp: , Rfl:  .  cephALEXin (KEFLEX) 500 MG capsule, Take 1 capsule (500 mg total) by mouth 2 (two) times daily., Disp: 14 capsule, Rfl: 0 .  clindamycin (CLEOCIN) 150 MG capsule, Take 1 capsule (150 mg total) by mouth 2 (two) times daily., Disp: 14 capsule, Rfl: 0 .  Continuous Blood Gluc Receiver (FREESTYLE LIBRE 14 DAY READER) DEVI, , Disp: , Rfl:  .  Continuous Blood Gluc Sensor (FREESTYLE LIBRE 14 DAY SENSOR) MISC, , Disp: , Rfl:  .  cyclobenzaprine (FLEXERIL) 10 MG tablet, Take 10 mg by mouth 3 (three) times daily as needed for muscle spasms. Patient doesn't know the mg/lc, Disp: , Rfl:  .  Dapagliflozin Propanediol (FARXIGA PO), Take by mouth., Disp: , Rfl:  .  Delafloxacin Meglumine (BAXDELA) 450 MG TABS, Take 1 tablet by mouth 2 (two) times daily., Disp: 14 tablet, Rfl: 0 .  doxycycline (MONODOX)  100 MG capsule, , Disp: , Rfl:  .  doxycycline (VIBRA-TABS) 100 MG tablet, Take 1 tablet (100 mg total) by mouth 2 (two) times daily., Disp: 20 tablet, Rfl: 0 .  fenofibrate (TRICOR) 145 MG tablet, , Disp: , Rfl:  .  furosemide (LASIX) 20 MG tablet, , Disp: , Rfl:  .  HUMALOG KWIKPEN 200 UNIT/ML SOPN, 0-70= EAT SNACK, 71-150=0 UNITS, 151-200=2 UNITS, 201-250= 4 UNITS, 251-300=6 UNITS, 301-350= 8 UNITS, 251-400= 10 UNITS, OVER 400 =15 UNITS, Disp: , Rfl:  .  LANTUS SOLOSTAR 100 UNIT/ML Solostar Pen, INJECT 50 UNITS AT BEDTIME, Disp: , Rfl: 2 .  levofloxacin (LEVAQUIN) 750 MG tablet, , Disp: , Rfl:  .  metFORMIN (GLUCOPHAGE) 1000 MG tablet, Take 1,000 mg by mouth 2 (two) times daily with a meal., Disp: , Rfl:  .  neomycin-polymyxin-hydrocortisone (CORTISPORIN) OTIC solution, Apply 2-3 drops to the ingrown toenail site twice daily. Cover with band-aid., Disp: 10 mL, Rfl: 0 .  oxyCODONE (ROXICODONE) 15 MG immediate release tablet, , Disp: , Rfl:  .  oxyCODONE-acetaminophen (PERCOCET) 10-325 MG tablet, Take 1 tablet by mouth every 4 (four) hours as needed for pain., Disp: 10 tablet, Rfl: 0 .  promethazine (PHENERGAN) 12.5 MG tablet, Take 12.5 mg by mouth every 6 (six) hours as needed for nausea or vomiting. Patient doesn't know the  mg/lc, Disp: , Rfl:  .  rosuvastatin (CRESTOR) 40 MG tablet, Take 40 mg by mouth daily., Disp: , Rfl:  .  sulfamethoxazole-trimethoprim (BACTRIM DS,SEPTRA DS) 800-160 MG tablet, , Disp: , Rfl:  .  triamcinolone ointment (KENALOG) 0.1 %, APP EXT AA BID, Disp: , Rfl:  .  zolpidem (AMBIEN) 10 MG tablet, Take 10 mg by mouth at bedtime as needed for sleep., Disp: , Rfl:   Social History   Tobacco Use  Smoking Status Never Smoker  Smokeless Tobacco Never Used    Allergies  Allergen Reactions  . Bactrim [Sulfamethoxazole-Trimethoprim]    Objective:   There were no vitals filed for this visit. There is no height or weight on file to calculate BMI. Constitutional Well  developed. Well nourished.  Vascular Foot warm and well perfused. Capillary refill normal to all digits.   Neurologic Normal speech. Oriented to person, place, and time. Epicritic sensation to light touch grossly present bilaterally.  Dermatologic Skin healing still with central dehisence. Superficial , no prbe to bone no warmth erythema signs of infection.  Orthopedic: Tenderness to palpation noted about the surgical site.   Radiographs: None Assessment:   1. Post-operative state    Plan:  Patient was evaluated and treated and all questions answered.  S/p foot surgery left -XR: None -WB Status: WBAt in surgical shoe -Wound gently debrided again with tissue nipper and 312 blade -Wound packed with Prisma -Follow-up in 2 weeks for recheck  Ingrown Toenails Right 4th/5th toenails -Patient would like to consider permanent nail removal. Discussed proceeding once the wound on her left foot is healed.  No follow-ups on file.

## 2019-08-10 NOTE — Progress Notes (Signed)
Subjective:  Patient ID: Gloria Hodge, female    DOB: Dec 09, 1965,  MRN: [id number]  Chief Complaint  Patient presents with  . Routine Post Op    Pt. states," it's been hurting on the side, seems to be doing better, healiong up right." -pt denies redness/swelling/drainage Tx: none    53 y.o. female returns for post-op check.  States that the area is healing well denies postoperative issues.  Having some pain on the outside.  Wearing a surgical shoe as directed.  Review of Systems: Negative except as noted in the HPI. Denies N/V/F/Ch.  Past Medical History:  Diagnosis Date  . Calf pain   . Depression   . Diabetes ([place])   . Dizziness   . Fatigue   . Frequent headaches   . IBS (irritable bowel syndrome)   . Memory loss   . Muscle pain   . Swelling     Current Outpatient Medications:  .  ALPRAZolam (XANAX) 1 MG tablet, Take 1 mg by mouth at bedtime as needed for anxiety., Disp: , Rfl:  .  cephALEXin (KEFLEX) 500 MG capsule, Take 1 capsule (500 mg total) by mouth 2 (two) times daily., Disp: 14 capsule, Rfl: 0 .  clindamycin (CLEOCIN) 150 MG capsule, Take 1 capsule (150 mg total) by mouth 2 (two) times daily., Disp: 14 capsule, Rfl: 0 .  Continuous Blood Gluc Receiver (FREESTYLE LIBRE 14 DAY READER) DEVI, , Disp: , Rfl:  .  Continuous Blood Gluc Sensor (FREESTYLE LIBRE 14 DAY SENSOR) MISC, , Disp: , Rfl:  .  cyclobenzaprine (FLEXERIL) 10 MG tablet, Take 10 mg by mouth 3 (three) times daily as needed for muscle spasms. Patient doesn't know the mg/lc, Disp: , Rfl:  .  Dapagliflozin Propanediol (FARXIGA PO), Take by mouth., Disp: , Rfl:  .  Delafloxacin Meglumine (BAXDELA) 450 MG TABS, Take 1 tablet by mouth 2 (two) times daily., Disp: 14 tablet, Rfl: 0 .  doxycycline (MONODOX) 100 MG capsule, , Disp: , Rfl:  .  doxycycline (VIBRA-TABS) 100 MG tablet, Take 1 tablet (100 mg total) by mouth 2 (two) times daily., Disp: 20 tablet, Rfl: 0 .  fenofibrate (TRICOR) 145 MG tablet, , Disp: ,  Rfl:  .  furosemide (LASIX) 20 MG tablet, , Disp: , Rfl:  .  HUMALOG KWIKPEN 200 UNIT/ML SOPN, 0-70= EAT SNACK, 71-150=0 UNITS, 151-200=2 UNITS, 201-250= 4 UNITS, 251-300=6 UNITS, 301-350= 8 UNITS, 251-400= 10 UNITS, OVER 400 =15 UNITS, Disp: , Rfl:  .  LANTUS SOLOSTAR 100 UNIT/ML Solostar Pen, INJECT 50 UNITS AT BEDTIME, Disp: , Rfl: 2 .  levofloxacin (LEVAQUIN) 750 MG tablet, , Disp: , Rfl:  .  metFORMIN (GLUCOPHAGE) 1000 MG tablet, Take 1,000 mg by mouth 2 (two) times daily with a meal., Disp: , Rfl:  .  neomycin-polymyxin-hydrocortisone (CORTISPORIN) OTIC solution, Apply 2-3 drops to the ingrown toenail site twice daily. Cover with band-aid., Disp: 10 mL, Rfl: 0 .  oxyCODONE (ROXICODONE) 15 MG immediate release tablet, , Disp: , Rfl:  .  oxyCODONE-acetaminophen (PERCOCET) 10-325 MG tablet, Take 1 tablet by mouth every 4 (four) hours as needed for pain., Disp: 10 tablet, Rfl: 0 .  promethazine (PHENERGAN) 12.5 MG tablet, Take 12.5 mg by mouth every 6 (six) hours as needed for nausea or vomiting. Patient doesn't know the mg/lc, Disp: , Rfl:  .  rosuvastatin (CRESTOR) 40 MG tablet, Take 40 mg by mouth daily., Disp: , Rfl:  .  sulfamethoxazole-trimethoprim (BACTRIM DS,SEPTRA DS) 800-160 MG tablet, , Disp: , Rfl:  .  triamcinolone ointment (KENALOG) 0.1 %, APP EXT AA BID, Disp: , Rfl:  .  zolpidem (AMBIEN) 10 MG tablet, Take 10 mg by mouth at bedtime as needed for sleep., Disp: , Rfl:   Social History   Tobacco Use  Smoking Status Never Smoker  Smokeless Tobacco Never Used    Allergies  Allergen Reactions  . Bactrim [Sulfamethoxazole-Trimethoprim]    Objective:   There were no vitals filed for this visit. There is no height or weight on file to calculate BMI. Constitutional Well developed. Well nourished.  Vascular Foot warm and well perfused. Capillary refill normal to all digits.   Neurologic Normal speech. Oriented to person, place, and time. Epicritic sensation to light touch  grossly present bilaterally.  Dermatologic Skin healing still with small area dehiscence distally without warmth erythema signs of infection.  Evidence of suture failure  Orthopedic: Tenderness to palpation noted about the surgical site.   Radiographs: None Assessment:   1. Post-operative state    Plan:  Patient was evaluated and treated and all questions answered.  S/p foot surgery left -XR: None -WB Status: WBAt in surgical shoe -Sutures: removed today. Wound gently debrided. Covered under global. -Wound packed with Prisma -Follow-up in 2 weeks for recheck No follow-ups on file.

## 2019-08-24 ENCOUNTER — Other Ambulatory Visit: Payer: Self-pay

## 2019-08-24 ENCOUNTER — Ambulatory Visit (INDEPENDENT_AMBULATORY_CARE_PROVIDER_SITE_OTHER): Payer: 59 | Admitting: Podiatry

## 2019-08-24 DIAGNOSIS — Z9889 Other specified postprocedural states: Secondary | ICD-10-CM

## 2019-09-21 ENCOUNTER — Other Ambulatory Visit: Payer: Self-pay

## 2019-09-21 ENCOUNTER — Encounter: Payer: 59 | Admitting: Podiatry

## 2019-10-10 NOTE — Progress Notes (Signed)
Subjective:  Patient ID: Gloria Hodge, female    DOB: 05/03/1966,  MRN: [id number]  Chief Complaint  Patient presents with  . Foot Ulcer    F.U Lt wound check pt. states," doing good, Pain about a 3/10." tx: sx shoe and neoporin     DOS: 05/18/2019 Procedure: Partial amputation L 4th toe  DOS: 05/31/2019 Procedure: MPJ amputation L 4th toe  DOS: 07/19/2019 Procedure: Repair dehiscence superficial  53 y.o. female returns for post-op check.  Hx as above.  Has been using her surgical shoe and Neosporin. Still having a little bit of pain.  Review of Systems: Negative except as noted in the HPI. Denies N/V/F/Ch.  Past Medical History:  Diagnosis Date  . Calf pain   . Depression   . Diabetes ([place])   . Dizziness   . Fatigue   . Frequent headaches   . IBS (irritable bowel syndrome)   . Memory loss   . Muscle pain   . Swelling     Current Outpatient Medications:  .  ALPRAZolam (XANAX) 1 MG tablet, Take 1 mg by mouth at bedtime as needed for anxiety., Disp: , Rfl:  .  buPROPion (WELLBUTRIN XL) 150 MG 24 hr tablet, Take 150 mg by mouth every morning., Disp: , Rfl:  .  cephALEXin (KEFLEX) 500 MG capsule, Take 1 capsule (500 mg total) by mouth 2 (two) times daily., Disp: 14 capsule, Rfl: 0 .  clindamycin (CLEOCIN) 150 MG capsule, Take 1 capsule (150 mg total) by mouth 2 (two) times daily., Disp: 14 capsule, Rfl: 0 .  Continuous Blood Gluc Receiver (FREESTYLE LIBRE 14 DAY READER) DEVI, , Disp: , Rfl:  .  Continuous Blood Gluc Sensor (FREESTYLE LIBRE 14 DAY SENSOR) MISC, , Disp: , Rfl:  .  cyclobenzaprine (FLEXERIL) 10 MG tablet, Take 10 mg by mouth 3 (three) times daily as needed for muscle spasms. Patient doesn't know the mg/lc, Disp: , Rfl:  .  Dapagliflozin Propanediol (FARXIGA PO), Take by mouth., Disp: , Rfl:  .  Delafloxacin Meglumine (BAXDELA) 450 MG TABS, Take 1 tablet by mouth 2 (two) times daily., Disp: 14 tablet, Rfl: 0 .  doxycycline (MONODOX) 100 MG capsule, , Disp:  , Rfl:  .  doxycycline (VIBRA-TABS) 100 MG tablet, Take 1 tablet (100 mg total) by mouth 2 (two) times daily., Disp: 20 tablet, Rfl: 0 .  fenofibrate (TRICOR) 145 MG tablet, , Disp: , Rfl:  .  furosemide (LASIX) 20 MG tablet, , Disp: , Rfl:  .  HUMALOG KWIKPEN 200 UNIT/ML SOPN, 0-70= EAT SNACK, 71-150=0 UNITS, 151-200=2 UNITS, 201-250= 4 UNITS, 251-300=6 UNITS, 301-350= 8 UNITS, 251-400= 10 UNITS, OVER 400 =15 UNITS, Disp: , Rfl:  .  LANTUS SOLOSTAR 100 UNIT/ML Solostar Pen, INJECT 50 UNITS AT BEDTIME, Disp: , Rfl: 2 .  levofloxacin (LEVAQUIN) 750 MG tablet, , Disp: , Rfl:  .  metFORMIN (GLUCOPHAGE) 1000 MG tablet, Take 1,000 mg by mouth 2 (two) times daily with a meal., Disp: , Rfl:  .  neomycin-polymyxin-hydrocortisone (CORTISPORIN) OTIC solution, Apply 2-3 drops to the ingrown toenail site twice daily. Cover with band-aid., Disp: 10 mL, Rfl: 0 .  ondansetron (ZOFRAN) 4 MG tablet, Take 4 mg by mouth 2 (two) times daily as needed., Disp: , Rfl:  .  oxyCODONE (ROXICODONE) 15 MG immediate release tablet, , Disp: , Rfl:  .  oxyCODONE-acetaminophen (PERCOCET) 10-325 MG tablet, Take 1 tablet by mouth every 4 (four) hours as needed for pain., Disp: 10 tablet, Rfl: 0 .  promethazine (PHENERGAN) 12.5 MG tablet, Take 12.5 mg by mouth every 6 (six) hours as needed for nausea or vomiting. Patient doesn't know the mg/lc, Disp: , Rfl:  .  rosuvastatin (CRESTOR) 40 MG tablet, Take 40 mg by mouth daily., Disp: , Rfl:  .  sulfamethoxazole-trimethoprim (BACTRIM DS,SEPTRA DS) 800-160 MG tablet, , Disp: , Rfl:  .  TRESIBA FLEXTOUCH 100 UNIT/ML SOPN FlexTouch Pen, INJECT 40 UNI [place] Q DAY. INCREASE TO FASTING BLOOD GLUCOSE 80-130. MAX DOSE 50 UNI PER DAY, Disp: , Rfl:  .  triamcinolone ointment (KENALOG) 0.1 %, APP EXT AA BID, Disp: , Rfl:  .  zolpidem (AMBIEN) 10 MG tablet, Take 10 mg by mouth at bedtime as needed for sleep., Disp: , Rfl:   Social History   Tobacco Use  Smoking Status Never Smoker  Smokeless  Tobacco Never Used    Allergies  Allergen Reactions  . Bactrim [Sulfamethoxazole-Trimethoprim]    Objective:   There were no vitals filed for this visit. There is no height or weight on file to calculate BMI. Constitutional Well developed. Well nourished.  Vascular Foot warm and well perfused. Capillary refill normal to all digits.   Neurologic Normal speech. Oriented to person, place, and time. Epicritic sensation to light touch grossly present bilaterally.  Dermatologic Skin healing still with only small area of open ulceration no warmth no erythema no signs of acute infections  Orthopedic: Tenderness to palpation noted about the surgical site.   Radiographs: None Assessment:   No diagnosis found. Stable plan:  Patient was evaluated and treated and all questions answered.  S/p foot surgery left -Wound almost fully healed.  Continue ointment and bandage.  Follow-up in 1 month for recheck .  No follow-ups on file.

## 2019-10-25 ENCOUNTER — Encounter: Payer: 59 | Admitting: Podiatry

## 2019-10-26 ENCOUNTER — Ambulatory Visit (INDEPENDENT_AMBULATORY_CARE_PROVIDER_SITE_OTHER): Payer: 59 | Admitting: Podiatry

## 2019-10-26 ENCOUNTER — Other Ambulatory Visit: Payer: Self-pay

## 2019-10-26 DIAGNOSIS — Z89422 Acquired absence of other left toe(s): Secondary | ICD-10-CM | POA: Diagnosis not present

## 2019-10-26 DIAGNOSIS — S91332A Puncture wound without foreign body, left foot, initial encounter: Secondary | ICD-10-CM

## 2019-10-26 DIAGNOSIS — B351 Tinea unguium: Secondary | ICD-10-CM | POA: Diagnosis not present

## 2019-10-26 DIAGNOSIS — Z89421 Acquired absence of other right toe(s): Secondary | ICD-10-CM

## 2019-10-26 NOTE — Progress Notes (Signed)
  Subjective:  Patient ID: Gloria Hodge, female    DOB: 06-02-1966,  MRN: [id number]  Chief Complaint  Patient presents with  . Wound Check    F/U Lt wound check pt. states," doing really well, it's healed up." Tx: none -pt denies redness/swellgin/draiange    54 y.o. female presents with the above complaint. History confirmed with patient.   Objective:  Physical Exam: warm, good capillary refill, nail exam  onychomycosis of the toenails, no trophic changes  normal DP and PT pulses, and absent sensation Left Foot: small area of likely puncture wound between 1st and 2nd metatarsals without warmth erythema signs of infection but overlying bulla.  Multiple toe amputations both feet.    Assessment:   1. Onychomycosis   2. History of amputation of lesser toe of both feet ([place])   3. Puncture wound of left foot, initial encounter    Plan:  Patient was evaluated and treated and all questions answered.  Onychomycosis, Hx of Amputation -Patient is diabetic with a qualifying condition for at risk foot care.  Procedure: Nail Debridement Rationale: Patient meets criteria for routine foot care due to amputation hx Type of Debridement: manual, sharp debridement. Instrumentation: Nail nipper, rotary burr. Number of Nails: 3  Puncture Wound Left Foot -Lesion debrided, no foreign body noted. No sign of infection. Abx ointment and band-aid applied.  Return in about 9 weeks (around 12/28/2019) for Diabetic Foot Care.

## 2019-11-29 HISTORY — PX: EYE SURGERY: SHX253

## 2019-12-13 HISTORY — PX: PORTACATH PLACEMENT: SHX2246

## 2019-12-28 ENCOUNTER — Encounter: Payer: 59 | Admitting: Podiatry

## 2020-01-12 DIAGNOSIS — C20 Malignant neoplasm of rectum: Secondary | ICD-10-CM | POA: Diagnosis not present

## 2020-01-13 DIAGNOSIS — M79669 Pain in unspecified lower leg: Secondary | ICD-10-CM

## 2020-01-13 HISTORY — DX: Pain in unspecified lower leg: M79.669

## 2020-01-17 ENCOUNTER — Other Ambulatory Visit: Payer: Self-pay | Admitting: Surgery

## 2020-01-17 DIAGNOSIS — C20 Malignant neoplasm of rectum: Secondary | ICD-10-CM

## 2020-01-20 ENCOUNTER — Other Ambulatory Visit (HOSPITAL_COMMUNITY): Payer: Self-pay | Admitting: Surgery

## 2020-01-20 ENCOUNTER — Other Ambulatory Visit: Payer: Self-pay | Admitting: Surgery

## 2020-01-20 DIAGNOSIS — C20 Malignant neoplasm of rectum: Secondary | ICD-10-CM

## 2020-01-27 ENCOUNTER — Ambulatory Visit (HOSPITAL_COMMUNITY): Payer: POS

## 2020-02-08 ENCOUNTER — Other Ambulatory Visit: Payer: POS

## 2020-02-08 ENCOUNTER — Other Ambulatory Visit: Payer: Self-pay

## 2020-02-08 ENCOUNTER — Ambulatory Visit (INDEPENDENT_AMBULATORY_CARE_PROVIDER_SITE_OTHER): Payer: 59 | Admitting: Podiatry

## 2020-02-08 DIAGNOSIS — M2041 Other hammer toe(s) (acquired), right foot: Secondary | ICD-10-CM

## 2020-02-08 NOTE — Progress Notes (Signed)
  Subjective:  Patient ID: Gloria Hodge, female    DOB: 04-17-1966,  MRN: [id number]  No chief complaint on file.  54 y.o. female presents for cocner of 4th toe dry skin. States that the toe has been swollen and sore. Denies redness and drainage. Has used neosporin.  Diagnosed recently with colon cancer.  States she fell yesterday in the bath tub and hit her head, has a knot on th eback of her head, doesn't feel right. Was checked out by her PCP.  Objective:  Physical Exam: warm, good capillary refill, nail exam  onychomycosis of the toenails, no trophic changes  normal DP and PT pulses, and absent sensation .Multiple toe amputations both feet.    Assessment:   1. Hammertoe of right foot    Plan:  Patient was evaluated and treated and all questions answered.  Pre-ulcer Right 4th toe -Gently debrided. Silvadene and band-aid applied  Advised her to f/u with PCP or urgent care if she continue to feel off for possible concussion symptoms.  F/u in 1 month for recheck  No follow-ups on file.

## 2020-02-11 ENCOUNTER — Other Ambulatory Visit: Payer: POS

## 2020-02-12 ENCOUNTER — Other Ambulatory Visit: Payer: POS

## 2020-02-16 ENCOUNTER — Ambulatory Visit
Admission: RE | Admit: 2020-02-16 | Discharge: 2020-02-16 | Disposition: A | Discharge status: Home / Self Care | Payer: PRIVATE HEALTH INSURANCE | Source: Ambulatory Visit | Attending: Surgery | Admitting: Surgery

## 2020-02-16 ENCOUNTER — Other Ambulatory Visit: Payer: Self-pay

## 2020-02-16 DIAGNOSIS — C20 Malignant neoplasm of rectum: Secondary | ICD-10-CM

## 2020-02-18 DIAGNOSIS — C20 Malignant neoplasm of rectum: Secondary | ICD-10-CM | POA: Diagnosis not present

## 2020-03-16 DIAGNOSIS — C20 Malignant neoplasm of rectum: Secondary | ICD-10-CM | POA: Diagnosis not present

## 2020-03-20 ENCOUNTER — Ambulatory Visit: Payer: 59 | Admitting: Podiatry

## 2020-03-28 ENCOUNTER — Encounter: Payer: Self-pay | Admitting: Podiatry

## 2020-03-28 ENCOUNTER — Other Ambulatory Visit: Payer: Self-pay

## 2020-03-28 ENCOUNTER — Ambulatory Visit (INDEPENDENT_AMBULATORY_CARE_PROVIDER_SITE_OTHER): Payer: 59 | Admitting: Podiatry

## 2020-03-28 DIAGNOSIS — L97521 Non-pressure chronic ulcer of other part of left foot limited to breakdown of skin: Secondary | ICD-10-CM

## 2020-03-28 DIAGNOSIS — M79676 Pain in unspecified toe(s): Secondary | ICD-10-CM

## 2020-03-28 DIAGNOSIS — L600 Ingrowing nail: Secondary | ICD-10-CM

## 2020-03-28 NOTE — Progress Notes (Signed)
  Subjective:  Patient ID: Gloria Hodge, female    DOB: 06/20/1966,  MRN: [id number]  Chief Complaint  Patient presents with  . Toe Injury    RIGHT 3RD TOE- pt state she is having some dark discoloration on toe- unable to feel. she has been under chemo and radation treatment.     55 y.o. female presents for wound care. Hx confirmed with patient. States her dog has been trying to lick her left 2nd toe Objective:  Physical Exam: Wound Location: left 2nd toe Wound Measurement: pinpoint Wound Base: Granular/Healthy Peri-wound: Calloused Exudate: Scant/small amount Purulent exudate wound without warmth, erythema, signs of acute infection Assessment:   1. Ulcer of toe, left, limited to breakdown of skin ([place])   2. Ingrown nail   3. Pain around toenail      Plan:  Patient was evaluated and treated and all questions answered.  Ulcer left 2nd toe -Gently debrided, small purulence noted. No warmth or erythema. Local infection only. Dressed with silvadene and band-aid  Right 3rd toe discoloration -Likely 2/2 ingrown nail. No acute infection. Nail gently debrided.    No follow-ups on file.

## 2020-03-30 DIAGNOSIS — C20 Malignant neoplasm of rectum: Secondary | ICD-10-CM | POA: Diagnosis not present

## 2020-04-25 ENCOUNTER — Ambulatory Visit: Payer: Self-pay | Admitting: General Surgery

## 2020-04-25 NOTE — H&P (Signed)
The patient is a 54 year old female who presents with colorectal cancer. 54 year old female who presents to the office with a newly diagnosed rectal cancer. She underwent a colonoscopy with Dr. Melina Copa in [place] for rectal bleeding. This showed a proximal rectal mass approximately 10 cm from the anus. Biopsy show adenocarcinoma. CT scan of the abdomen and pelvis shows no sign of metastatic disease, but some enlarged lymph nodes around the periaortic chain. It does not appear that she has had a CEA or a CT chest. MRI was performed which showed a T3 proximal rectal lesion approximally 6 cm in length with the distal edge being 8.6 cm to the internal anal sphincter. T3N0. She has underwent port placement and states that completed chemotherapy and RT in early July.    Problem List/Past Medical Leighton Ruff, MD; 3/53/2992 11:41 AM) RECTAL CANCER (C20)  Past Surgical History Leighton Ruff, MD; 02/07/8340 11:41 AM) Hysterectomy (not due to cancer) - Partial  Diagnostic Studies History Leighton Ruff, MD; 9/62/2297 11:41 AM) Mammogram >3 years ago  Allergies (Chanel Teressa Senter, CMA; 04/25/2020 11:22 AM) Bactrim *ANTI-INFECTIVE AGENTS - MISC.* Sulfa-Mag *MINERALS & ELECTROLYTES* Allergies Reconciled  Medication History Leighton Ruff, MD; 9/89/2119 11:41 AM) oxyCODONE HCl (20MG  Tablet, Oral) Active. BD Pen Needle Short U/F (31G X 8 MM Misc,) Active. Cyclobenzaprine HCl (10MG  Tablet, Oral) Active. Fenofibrate (145MG  Tablet, Oral) Active. Insulin Lispro (100UNIT/ML Solution, Subcutaneous) Active. metFORMIN HCl (1000MG  Tablet, Oral) Active. Linzess (145MCG Capsule, Oral) Active. Ondansetron (4MG  Tablet Disint, Oral) Active. Tyler Aas FlexTouch (100UNIT/ML Soln Pen-inj, Subcutaneous) Active. Furosemide (20MG  Tablet, Oral) Active. Rosuvastatin Calcium (20MG  Tablet, Oral) Active.  Social History Leighton Ruff, MD; 01/29/4080 11:41 AM) Caffeine use Carbonated beverages,  Coffee, Tea. No alcohol use No drug use Tobacco use Never smoker.  Family History Leighton Ruff, MD; 4/48/1856 11:41 AM) Breast Cancer Mother. Heart Disease Brother. Prostate Cancer Father.  Pregnancy / Birth History Leighton Ruff, MD; 12/25/9700 11:41 AM) Age at menarche 76 years. Age of menopause 70-50 Gravida 1 Para 1 Regular periods  Other Problems Leighton Ruff, MD; 6/37/8588 11:41 AM) Anxiety Disorder Back Pain Depression Diabetes Mellitus Gastroesophageal Reflux Disease Hypercholesterolemia     Review of Systems Leighton Ruff MD; 02/13/7740 11:41 AM) General Not Present- Appetite Loss, Chills, Fatigue, Fever, Night Sweats, Weight Gain and Weight Loss. Skin Not Present- Change in Wart/Mole, Dryness, Hives, Jaundice, New Lesions, Non-Healing Wounds, Rash and Ulcer. HEENT Not Present- Earache, Hearing Loss, Hoarseness, Nose Bleed, Oral Ulcers, Ringing in the Ears, Seasonal Allergies, Sinus Pain, Sore Throat, Visual Disturbances, Wears glasses/contact lenses and Yellow Eyes. Respiratory Present- Snoring. Not Present- Bloody sputum, Chronic Cough, Difficulty Breathing and Wheezing. Breast Not Present- Breast Mass, Breast Pain, Nipple Discharge and Skin Changes. Cardiovascular Not Present- Chest Pain, Difficulty Breathing Lying Down, Leg Cramps, Palpitations, Rapid Heart Rate, Shortness of Breath and Swelling of Extremities. Gastrointestinal Present- Bloating, Bloody Stool and Rectal Pain. Not Present- Abdominal Pain, Change in Bowel Habits, Chronic diarrhea, Constipation, Difficulty Swallowing, Excessive gas, Gets full quickly at meals, Hemorrhoids, Indigestion, Nausea and Vomiting. Musculoskeletal Present- Back Pain, Joint Pain, Muscle Pain and Muscle Weakness. Not Present- Joint Stiffness and Swelling of Extremities. Neurological Present- Numbness. Not Present- Decreased Memory, Fainting, Headaches, Seizures, Tingling, Tremor, Trouble walking and  Weakness. Psychiatric Present- Anxiety and Depression. Not Present- Bipolar, Change in Sleep Pattern, Fearful and Frequent crying. Endocrine Present- New Diabetes. Not Present- Cold Intolerance, Excessive Hunger, Hair Changes, Heat Intolerance and Hot flashes. Hematology Present- Easy Bruising. Not Present- Blood Thinners, Excessive bleeding, Gland problems,  HIV and Persistent Infections.  Vitals (Chanel Nolan CMA; 04/25/2020 11:22 AM) 04/25/2020 11:22 AM Weight: 161.38 lb Height: 62in Body Surface Area: 1.74 m Body Mass Index: 29.52 kg/m  Temp.: 98.3F  Pulse: 101 (Regular)         Physical Exam Leighton Ruff MD; 4/49/2010 11:41 AM)  General Mental Status-Alert. General Appearance-Not in acute distress. Build & Nutrition-Well nourished. Posture-Normal posture. Gait-Normal.  Head and Neck Head-normocephalic, atraumatic with no lesions or palpable masses. Trachea-midline.  Chest and Lung Exam Inspection Movements - Symmetrical.  Abdomen Inspection Inspection of the abdomen reveals - No Hernias. Palpation/Percussion Palpation and Percussion of the abdomen reveal - Soft, Non Tender and No Rigidity (guarding).  Rectal Anorectal Exam External - normal sphincter . Internal - normal sphincter tone.  Neurologic Neurologic evaluation reveals -alert and oriented x 3 with no impairment of recent or remote memory, normal attention span and ability to concentrate, normal sensation and normal coordination.  Musculoskeletal Normal Exam - Bilateral-Upper Extremity Strength Normal and Lower Extremity Strength Normal.    Assessment & Plan Leighton Ruff MD; 0/71/2197 11:41 AM)  RECTAL CANCER (C20) Impression: 54 year old female with a proximal rectal cancer treated with neoadjuvant chemotherapy and radiation. This was completed in early July 2021. I have recommended that we proceed with robotic-assisted low anterior resection and possible diverting  ileostomy in September 2021. We have discussed typical risk associated with surgery as well as postoperative recovery times and symptoms associated with low anterior resection. All questions were answered. Patient is on chronic narcotics for back pain and is aware that she will not be able to the prescribed additional narcotics upon leaving the hospital unless it is done with her prescribing physician. The surgery and anatomy were described to the patient as well as the risks of surgery and the possible complications. These include: Bleeding, deep abdominal infections and possible wound complications such as hernia and infection, damage to adjacent structures, leak of surgical connections, which can lead to other surgeries and possibly an ostomy, possible need for other procedures, such as abscess drains in radiology, possible prolonged hospital stay, possible diarrhea from removal of part of the colon, possible constipation from narcotics, possible bowel, bladder or sexual dysfunction if having rectal surgery, prolonged fatigue/weakness or appetite loss, possible early recurrence of of disease, possible complications of their medical problems such as heart disease or arrhythmias or lung problems, death (less than 1%). I believe the patient

## 2020-05-01 ENCOUNTER — Ambulatory Visit: Payer: 59 | Admitting: Podiatry

## 2020-05-15 DIAGNOSIS — C20 Malignant neoplasm of rectum: Secondary | ICD-10-CM | POA: Diagnosis not present

## 2020-06-08 ENCOUNTER — Inpatient Hospital Stay (HOSPITAL_COMMUNITY): Admission: RE | Admit: 2020-06-08 | Payer: Medicare Other | Source: Ambulatory Visit

## 2020-06-08 NOTE — Patient Instructions (Addendum)
DUE TO COVID-19 ONLY ONE VISITOR IS ALLOWED TO COME WITH YOU AND STAY IN THE WAITING ROOM ONLY DURING PRE OP AND PROCEDURE DAY OF SURGERY. THE 1 VISITOR  MAY VISIT WITH YOU AFTER SURGERY IN YOUR PRIVATE ROOM DURING VISITING HOURS ONLY!  YOU NEED TO HAVE A COVID 19 TEST ON__9/4_____ @_12 :05______, THIS TEST MUST BE DONE BEFORE SURGERY,  COVID TESTING SITE [address] [place] [address], IT IS ON THE RIGHT GOING OUT [address] APPROXIMATELY  2 MINUTES PAST [employer] ON THE RIGHT. ONCE YOUR COVID TEST IS COMPLETED,  PLEASE BEGIN THE QUARANTINE INSTRUCTIONS AS OUTLINED IN YOUR HANDOUT.                Gloria Hodge   Your procedure is scheduled on: 06/21/20   Report to [facility] Main  Entrance   Report to admitting at  6:30 AM     Call this number if you have problems the morning of surgery [phone]   Follow the Dr's orders for the bowel prep.  Drink plenty of fluids the day of your prep to prevent dehydration.    Remember: Do not eat food after Midnight.   DRINK 2 PRESURGERY DRINKS THE NIGHT BEFORE SURGERY AT10:00 PM. AND 1 PRESURGERY DRINK THE DAY OF SURGERY at  5:30 AM.    BRUSH YOUR TEETH MORNING OF SURGERY AND RINSE YOUR MOUTH OUT, NO CHEWING GUM CANDY OR MINTS.     Take these medicines the morning of surgery with A SIP OF WATER: NONE  DO NOT TAKE ANY DIABETIC MEDICATIONS DAY OF YOUR SURGERY    How to Manage Your Diabetes Before and After Surgery  Why is it important to control my blood sugar before and after surgery?  Improving blood sugar levels before and after surgery helps healing and can limit problems.  A way of improving blood sugar control is eating a healthy diet by: o  Eating less sugar and carbohydrates o  Increasing activity/exercise o  Talking with your doctor about reaching your blood sugar goals  High blood sugars (greater than 180 mg/dL) can raise your risk of infections and slow your recovery, so  you will need to focus on controlling your diabetes during the weeks before surgery.  Make sure that the doctor who takes care of your diabetes knows about your planned surgery including the date and location.  How do I manage my blood sugar before surgery?  Check your blood sugar at least 4 times a day, starting 2 days before surgery, to make sure that the level is not too high or low. o Check your blood sugar the morning of your surgery when you wake up and every 2 hours until you get to the Short Stay unit.  If your blood sugar is less than 70 mg/dL, you will need to treat for low blood sugar: o Do not take insulin. o Treat a low blood sugar (less than 70 mg/dL) with  cup of clear juice (cranberry or apple), 4 glucose tablets, OR glucose gel. o Recheck blood sugar in 15 minutes after treatment (to make sure it is greater than 70 mg/dL). If your blood sugar is not greater than 70 mg/dL on recheck, call [phone] for further instructions.  Report your blood sugar to the short stay nurse when you get to Short Stay.   If you are admitted to the hospital after surgery: o Your blood sugar will be checked by the staff and you will probably  be given insulin after surgery (instead of oral diabetes medicines) to make sure you have good blood sugar levels. o The goal for blood sugar control after surgery is 80-180 mg/dL.   WHAT DO I DO ABOUT MY DIABETES MEDICATION?   Do not take oral diabetes medicines (pills) the morning of surgery.   THE NIGHT BEFORE SURGERY, take 0    units of  insulin.        THE MORNING OF SURGERY, take 30 units of   ttresiba  insulin.   The day of surgery, do not take other diabetes injectables, including Byetta (exenatide), Bydureon (exenatide ER), Victoza (liraglutide), or Trulicity (dulaglutide).   If your CBG is greater than 220 mg/dL, you may take  of your sliding scale   (correction) dose of insulin.                              You may not have  any metal on your body including hair pins and              piercings  Do not wear jewelry, make-up, lotions, powders or perfumes, deodorant             Do not wear nail polish on your fingernails.  Do not shave  48 hours prior to surgery.     Do not bring valuables to the hospital. [place].  Contacts, dentures or bridgework may not be worn into surgery.       Special Instructions: N/A              Please read over the following fact sheets you were given: _____________________________________________________________________             [facility] - Preparing for Surgery  Before surgery, you can play an important role.   Because skin is not sterile, your skin needs to be as free of germs as possible .  You can reduce the number of germs on your skin by washing with CHG (chlorahexidine gluconate) soap before surgery.   CHG is an antiseptic cleaner which kills germs and bonds with the skin to continue killing germs even after washing. Please DO NOT use if you have an allergy to CHG or antibacterial soaps.   If your skin becomes reddened/irritated stop using the CHG and inform your nurse when you arrive at Short Stay. Do not shave (including legs and underarms) for at least 48 hours prior to the first CHG shower.  . Please follow these instructions carefully:  1.  Shower with CHG Soap the night before surgery and the  morning of Surgery.  2.  If you choose to wash your hair, wash your hair first as usual with your  normal  shampoo.  3.  After you shampoo, rinse your hair and body thoroughly to remove the  shampoo.                                        4.  Use CHG as you would any other liquid soap.  You can apply chg directly  to the skin and wash                       Gently with  a scrungie or clean washcloth.  5.  Apply the CHG Soap to your body ONLY FROM THE NECK DOWN.   Do not use on face/ open                           Wound  or open sores. Avoid contact with eyes, ears mouth and genitals (private parts).                       Wash face,  Genitals (private parts) with your normal soap.             6.  Wash thoroughly, paying special attention to the area where your surgery  will be performed.  7.  Thoroughly rinse your body with warm water from the neck down.  8.  DO NOT shower/wash with your normal soap after using and rinsing off  the CHG Soap.             9.  Pat yourself dry with a clean towel.            10.  Wear clean pajamas.            11.  Place clean sheets on your bed the night of your first shower and do not  sleep with pets. Day of Surgery : Do not apply any lotions/deodorants the morning of surgery.  Please wear clean clothes to the hospital/surgery center.     Incentive Spirometer  An incentive spirometer is a tool that can help keep your lungs clear and active. This tool measures how well you are filling your lungs with each breath. Taking long deep breaths may help reverse or decrease the chance of developing breathing (pulmonary) problems (especially infection) following:  A long period of time when you are unable to move or be active. BEFORE THE PROCEDURE   If the spirometer includes an indicator to show your best effort, your nurse or respiratory therapist will set it to a desired goal.  If possible, sit up straight or lean slightly forward. Try not to slouch.  Hold the incentive spirometer in an upright position. INSTRUCTIONS FOR USE  1. Sit on the edge of your bed if possible, or sit up as far as you can in bed or on a chair. 2. Hold the incentive spirometer in an upright position. 3. Breathe out normally. 4. Place the mouthpiece in your mouth and seal your lips tightly around it. 5. Breathe in slowly and as deeply as possible, raising the piston or the ball toward the top of the column. 6. Hold your breath for 3-5 seconds or for as long as possible. Allow the piston or ball to fall  to the bottom of the column. 7. Remove the mouthpiece from your mouth and breathe out normally. 8. Rest for a few seconds and repeat Steps 1 through 7 at least 10 times every 1-2 hours when you are awake. Take your time and take a few normal breaths between deep breaths. 9. The spirometer may include an indicator to show your best effort. Use the indicator as a goal to work toward during each repetition. 10. After each set of 10 deep breaths, practice coughing to be sure your lungs are clear. If you have an incision (the cut made at the time of surgery), support your incision when coughing by placing a pillow or rolled up towels firmly against it. Once you are able to get out  of bed, walk around indoors and cough well. You may stop using the incentive spirometer when instructed by your caregiver.  RISKS AND COMPLICATIONS  Take your time so you do not get dizzy or light-headed.  If you are in pain, you may need to take or ask for pain medication before doing incentive spirometry. It is harder to take a deep breath if you are having pain. AFTER USE  Rest and breathe slowly and easily.  It can be helpful to keep track of a log of your progress. Your caregiver can provide you with a simple table to help with this. If you are using the spirometer at home, follow these instructions: Advance IF:   You are having difficultly using the spirometer.  You have trouble using the spirometer as often as instructed.  Your pain medication is not giving enough relief while using the spirometer.  You develop fever of 100.5 F (38.1 C) or higher. SEEK IMMEDIATE MEDICAL CARE IF:   You cough up bloody sputum that had not been present before.  You develop fever of 102 F (38.9 C) or greater.  You develop worsening pain at or near the incision site. MAKE SURE YOU:   Understand these instructions.  Will watch your condition.  Will get help right away if you are not doing well or get  worse. Document Released: 02/10/2007 Document Revised: 12/23/2011 Document Reviewed: 04/13/2007 ExitCare Patient Information 2014 [place].   ________________________________________________________________________   FAILURE TO FOLLOW THESE INSTRUCTIONS MAY RESULT IN THE CANCELLATION OF YOUR SURGERY PATIENT SIGNATURE_________________________________  NURSE SIGNATURE__________________________________  ________________________________________________________________________

## 2020-06-09 ENCOUNTER — Encounter (HOSPITAL_COMMUNITY): Payer: Self-pay

## 2020-06-09 ENCOUNTER — Encounter (HOSPITAL_COMMUNITY)
Admission: RE | Admit: 2020-06-09 | Discharge: 2020-06-09 | Disposition: A | Discharge status: Home / Self Care | Payer: 59 | Source: Ambulatory Visit | Attending: General Surgery | Admitting: General Surgery

## 2020-06-09 ENCOUNTER — Other Ambulatory Visit: Payer: Self-pay

## 2020-06-09 DIAGNOSIS — Z01818 Encounter for other preprocedural examination: Secondary | ICD-10-CM | POA: Diagnosis present

## 2020-06-09 HISTORY — DX: Type 2 diabetes mellitus with unspecified diabetic retinopathy without macular edema: E11.319

## 2020-06-09 HISTORY — DX: Unspecified osteoarthritis, unspecified site: M19.90

## 2020-06-09 LAB — CBC
HCT: 35.8 % — ABNORMAL LOW (ref 36.0–46.0)
Hemoglobin: 11.5 g/dL — ABNORMAL LOW (ref 12.0–15.0)
MCH: 27.5 pg (ref 26.0–34.0)
MCHC: 32.1 g/dL (ref 30.0–36.0)
MCV: 85.6 fL (ref 80.0–100.0)
Platelets: 302 10*3/uL (ref 150–400)
RBC: 4.18 MIL/uL (ref 3.87–5.11)
RDW: 14.9 % (ref 11.5–15.5)
WBC: 7 10*3/uL (ref 4.0–10.5)
nRBC: 0 % (ref 0.0–0.2)

## 2020-06-09 LAB — BASIC METABOLIC PANEL
Anion gap: 10 (ref 5–15)
BUN: 26 mg/dL — ABNORMAL HIGH (ref 6–20)
CO2: 29 mmol/L (ref 22–32)
Calcium: 9.2 mg/dL (ref 8.9–10.3)
Chloride: 102 mmol/L (ref 98–111)
Creatinine, Ser: 1.23 mg/dL — ABNORMAL HIGH (ref 0.44–1.00)
GFR calc Af Amer: 58 mL/min — ABNORMAL LOW (ref >60)
GFR calc non Af Amer: 50 mL/min — ABNORMAL LOW (ref >60)
Glucose, Bld: 88 mg/dL (ref 70–99)
Potassium: 4.2 mmol/L (ref 3.5–5.1)
Sodium: 141 mmol/L (ref 135–145)

## 2020-06-09 LAB — HEMOGLOBIN A1C
Hgb A1c MFr Bld: 9.8 % — ABNORMAL HIGH (ref 4.8–5.6)
Mean Plasma Glucose: 234.56 mg/dL

## 2020-06-09 LAB — TYPE AND SCREEN
ABO/RH(D): A NEG
Antibody Screen: NEGATIVE

## 2020-06-09 LAB — GLUCOSE, CAPILLARY: Glucose-Capillary: 96 mg/dL (ref 70–99)

## 2020-06-09 NOTE — Progress Notes (Signed)
COVID Vaccine Completed:No Date COVID Vaccine completed: COVID vaccine manufacturer: Pfizer    Moderna   [employer]   PCP - Dr. Cleda Mccreedy Cardiologist -no   Chest x-ray - no EKG - 06/09/20 Stress Test - no ECHO - no Cardiac Cath - no  Sleep Study - no CPAP -   Fasting Blood Sugar - 85-176 Checks Blood Sugar _QD____ times a day  Blood Thinner Instructions:NA Aspirin Instructions: Last Dose:  Anesthesia review:   Patient denies shortness of breath, fever, cough and chest pain at PAT appointment yes   Patient verbalized understanding of instructions that were given to them at the PAT appointment. Patient was also instructed that they will need to review over the PAT instructions again at home before surgery.yes  Pt has had poorly controlled diabetes with CBGs into the 500s. Her Dr. Doristine Section her on Tresiba and a sliding scale that has kept her CBG more stable.  She has diabetic retinopathy and has a hard time seeing even with her glasses.  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 I told her to go through her prep  and pre op instructions with her Son.

## 2020-06-09 NOTE — Consult Note (Signed)
[facility] Nurse requested for preoperative stoma site marking  Discussed surgical procedure and stoma creation with patient. Explained role of the [place] nurse team.  Provided the patient with educational booklet and provided samples of pouching options.  Answered patient  questions.   Examined patient sitting, and standing in order to place the marking in the patient's visual field, away from any creases or abdominal contour issues and within the rectus muscle.  Patient has firm, rounded abdomen and wears pants just below umbilicus.  Will mark above for that reason, to improve visualization of stoma.   Marked for colostomy in the LLQ  3 cm to the left of the umbilicus and 3_cm above the umbilicus.  Marked for ileostomy in the RLQ  3 cm to the right of the umbilicus and  3  cm above the umbilicus.   Patient's abdomen cleansed with CHG wipes at site markings, allowed to air dry prior to marking.Covered mark with thin film transparent dressing to preserve mark until date of surgery.   [place] Nurse team will follow up with patient after surgery for continue ostomy care and teaching.   Domenic Moras MSN, RN, FNP-BC CWON Wound, Ostomy, Continence Nurse Pager [phone]

## 2020-06-10 LAB — SURGICAL PCR SCREEN
MRSA, PCR: POSITIVE — AB
Staphylococcus aureus: POSITIVE — AB

## 2020-06-17 ENCOUNTER — Other Ambulatory Visit (HOSPITAL_COMMUNITY)
Admission: RE | Admit: 2020-06-17 | Discharge: 2020-06-17 | Disposition: A | Discharge status: Home / Self Care | Payer: 59 | Source: Ambulatory Visit | Attending: General Surgery | Admitting: General Surgery

## 2020-06-17 DIAGNOSIS — Z01812 Encounter for preprocedural laboratory examination: Secondary | ICD-10-CM | POA: Insufficient documentation

## 2020-06-17 DIAGNOSIS — Z20822 Contact with and (suspected) exposure to covid-19: Secondary | ICD-10-CM | POA: Diagnosis not present

## 2020-06-17 LAB — SARS CORONAVIRUS 2 (TAT 6-24 HRS): SARS Coronavirus 2: NEGATIVE

## 2020-06-20 MED ORDER — BUPIVACAINE LIPOSOME 1.3 % IJ SUSP
20.0000 mL | INTRAMUSCULAR | Status: DC
  Filled 2020-06-20: qty 20

## 2020-06-21 ENCOUNTER — Inpatient Hospital Stay (HOSPITAL_COMMUNITY): Payer: 59 | Admitting: Physician Assistant

## 2020-06-21 ENCOUNTER — Encounter (HOSPITAL_COMMUNITY): Payer: Self-pay | Admitting: General Surgery

## 2020-06-21 ENCOUNTER — Inpatient Hospital Stay (HOSPITAL_COMMUNITY)
Admission: RE | Admit: 2020-06-21 | Discharge: 2020-06-24 | DRG: 331 | Disposition: A | Discharge status: Home / Self Care | Payer: 59 | Source: Other Acute Inpatient Hospital | Attending: General Surgery | Admitting: General Surgery

## 2020-06-21 ENCOUNTER — Inpatient Hospital Stay (HOSPITAL_COMMUNITY): Payer: 59 | Admitting: Certified Registered Nurse Anesthetist

## 2020-06-21 ENCOUNTER — Encounter (HOSPITAL_COMMUNITY)
Admission: RE | Disposition: A | Discharge status: Home / Self Care | Payer: Self-pay | Source: Other Acute Inpatient Hospital | Attending: General Surgery

## 2020-06-21 ENCOUNTER — Other Ambulatory Visit: Payer: Self-pay

## 2020-06-21 DIAGNOSIS — Z9221 Personal history of antineoplastic chemotherapy: Secondary | ICD-10-CM | POA: Diagnosis not present

## 2020-06-21 DIAGNOSIS — Z803 Family history of malignant neoplasm of breast: Secondary | ICD-10-CM

## 2020-06-21 DIAGNOSIS — E7800 Pure hypercholesterolemia, unspecified: Secondary | ICD-10-CM | POA: Diagnosis present

## 2020-06-21 DIAGNOSIS — Z85048 Personal history of other malignant neoplasm of rectum, rectosigmoid junction, and anus: Secondary | ICD-10-CM

## 2020-06-21 DIAGNOSIS — C20 Malignant neoplasm of rectum: Secondary | ICD-10-CM | POA: Diagnosis present

## 2020-06-21 DIAGNOSIS — K219 Gastro-esophageal reflux disease without esophagitis: Secondary | ICD-10-CM | POA: Diagnosis present

## 2020-06-21 DIAGNOSIS — Z8042 Family history of malignant neoplasm of prostate: Secondary | ICD-10-CM | POA: Diagnosis not present

## 2020-06-21 DIAGNOSIS — Z8249 Family history of ischemic heart disease and other diseases of the circulatory system: Secondary | ICD-10-CM | POA: Diagnosis not present

## 2020-06-21 HISTORY — PX: XI ROBOTIC ASSISTED LOWER ANTERIOR RESECTION: SHX6558

## 2020-06-21 HISTORY — PX: FLEXIBLE SIGMOIDOSCOPY: SHX5431

## 2020-06-21 LAB — GLUCOSE, CAPILLARY
Glucose-Capillary: 219 mg/dL — ABNORMAL HIGH (ref 70–99)
Glucose-Capillary: 266 mg/dL — ABNORMAL HIGH (ref 70–99)
Glucose-Capillary: 272 mg/dL — ABNORMAL HIGH (ref 70–99)
Glucose-Capillary: 231 mg/dL — ABNORMAL HIGH (ref 70–99)
Glucose-Capillary: 270 mg/dL — ABNORMAL HIGH (ref 70–99)

## 2020-06-21 LAB — ABO/RH: ABO/RH(D): A NEG

## 2020-06-21 SURGERY — RESECTION, RECTUM, LOW ANTERIOR, ROBOT-ASSISTED
Anesthesia: General | Site: Rectum

## 2020-06-21 MED ORDER — LABETALOL HCL 5 MG/ML IV SOLN
10.0000 mg | Freq: Once | INTRAVENOUS | Status: AC
  Administered 2020-06-21: 10 mg via INTRAVENOUS

## 2020-06-21 MED ORDER — INSULIN ASPART 100 UNIT/ML ~~LOC~~ SOLN
0.0000 [IU] | Freq: Every day | SUBCUTANEOUS | Status: DC
  Administered 2020-06-21: 3 [IU] via SUBCUTANEOUS
  Administered 2020-06-23: 2 [IU] via SUBCUTANEOUS

## 2020-06-21 MED ORDER — ALUM & MAG HYDROXIDE-SIMETH 200-200-20 MG/5ML PO SUSP: 30.0000 mL | Freq: Four times a day (QID) | ORAL | Status: DC | PRN

## 2020-06-21 MED ORDER — BUPIVACAINE-EPINEPHRINE (PF) 0.25% -1:200000 IJ SOLN
INTRAMUSCULAR | Status: AC
  Filled 2020-06-21: qty 30

## 2020-06-21 MED ORDER — ACETAMINOPHEN 500 MG PO TABS
1000.0000 mg | ORAL_TABLET | Freq: Four times a day (QID) | ORAL | Status: DC
  Administered 2020-06-21 – 2020-06-24 (×10): 1000 mg via ORAL
  Filled 2020-06-21 (×11): qty 2

## 2020-06-21 MED ORDER — LIDOCAINE 2% (20 MG/ML) 5 ML SYRINGE
INTRAMUSCULAR | Status: AC
  Filled 2020-06-21: qty 5

## 2020-06-21 MED ORDER — LACTATED RINGERS IV SOLN: INTRAVENOUS | Status: DC

## 2020-06-21 MED ORDER — ROCURONIUM BROMIDE 10 MG/ML (PF) SYRINGE
PREFILLED_SYRINGE | INTRAVENOUS | Status: AC
  Filled 2020-06-21: qty 10

## 2020-06-21 MED ORDER — ALVIMOPAN 12 MG PO CAPS
12.0000 mg | ORAL_CAPSULE | ORAL | Status: AC
  Administered 2020-06-21: 12 mg via ORAL
  Filled 2020-06-21: qty 1

## 2020-06-21 MED ORDER — OXYCODONE HCL 5 MG PO TABS
15.0000 mg | ORAL_TABLET | ORAL | Status: DC | PRN
  Administered 2020-06-21 – 2020-06-24 (×11): 15 mg via ORAL
  Filled 2020-06-21 (×12): qty 3

## 2020-06-21 MED ORDER — LIDOCAINE IN D5W 4-5 MG/ML-% IV SOLN
INTRAVENOUS | Status: DC | PRN
  Administered 2020-06-21: 10 ug/kg/min via INTRAVENOUS

## 2020-06-21 MED ORDER — PHENYLEPHRINE 40 MCG/ML (10ML) SYRINGE FOR IV PUSH (FOR BLOOD PRESSURE SUPPORT)
PREFILLED_SYRINGE | INTRAVENOUS | Status: DC | PRN
  Administered 2020-06-21: 80 ug via INTRAVENOUS
  Administered 2020-06-21: 120 ug via INTRAVENOUS
  Administered 2020-06-21: 80 ug via INTRAVENOUS

## 2020-06-21 MED ORDER — SODIUM CHLORIDE 0.9 % IV SOLN
2.0000 g | INTRAVENOUS | Status: AC
  Administered 2020-06-21: 2 g via INTRAVENOUS
  Filled 2020-06-21: qty 2

## 2020-06-21 MED ORDER — ORAL CARE MOUTH RINSE: 15.0000 mL | Freq: Once | OROMUCOSAL | Status: AC

## 2020-06-21 MED ORDER — FENTANYL CITRATE (PF) 100 MCG/2ML IJ SOLN
INTRAMUSCULAR | Status: AC
  Filled 2020-06-21: qty 2

## 2020-06-21 MED ORDER — ZOLPIDEM TARTRATE 5 MG PO TABS
5.0000 mg | ORAL_TABLET | Freq: Every day | ORAL | Status: DC
  Administered 2020-06-21 – 2020-06-23 (×3): 5 mg via ORAL
  Filled 2020-06-21 (×3): qty 1

## 2020-06-21 MED ORDER — HEPARIN SODIUM (PORCINE) 5000 UNIT/ML IJ SOLN
5000.0000 [IU] | Freq: Once | INTRAMUSCULAR | Status: AC
  Administered 2020-06-21: 5000 [IU] via SUBCUTANEOUS
  Filled 2020-06-21: qty 1

## 2020-06-21 MED ORDER — SERTRALINE HCL 100 MG PO TABS
100.0000 mg | ORAL_TABLET | Freq: Every day | ORAL | Status: DC
  Administered 2020-06-21 – 2020-06-24 (×4): 100 mg via ORAL
  Filled 2020-06-21 (×4): qty 1

## 2020-06-21 MED ORDER — EPHEDRINE 5 MG/ML INJ
INTRAVENOUS | Status: AC
  Filled 2020-06-21: qty 10

## 2020-06-21 MED ORDER — INSULIN ASPART 100 UNIT/ML ~~LOC~~ SOLN
SUBCUTANEOUS | Status: AC
  Filled 2020-06-21: qty 1

## 2020-06-21 MED ORDER — EPHEDRINE SULFATE-NACL 50-0.9 MG/10ML-% IV SOSY
PREFILLED_SYRINGE | INTRAVENOUS | Status: DC | PRN
  Administered 2020-06-21: 10 mg via INTRAVENOUS

## 2020-06-21 MED ORDER — ONDANSETRON HCL 4 MG/2ML IJ SOLN
INTRAMUSCULAR | Status: DC | PRN
  Administered 2020-06-21: 4 mg via INTRAVENOUS

## 2020-06-21 MED ORDER — GABAPENTIN 300 MG PO CAPS
300.0000 mg | ORAL_CAPSULE | ORAL | Status: AC
  Administered 2020-06-21: 300 mg via ORAL
  Filled 2020-06-21: qty 1

## 2020-06-21 MED ORDER — LABETALOL HCL 5 MG/ML IV SOLN
5.0000 mg | INTRAVENOUS | Status: AC
  Administered 2020-06-21 (×2): 5 mg via INTRAVENOUS

## 2020-06-21 MED ORDER — ALVIMOPAN 12 MG PO CAPS
12.0000 mg | ORAL_CAPSULE | Freq: Two times a day (BID) | ORAL | Status: DC
  Administered 2020-06-22 (×2): 12 mg via ORAL
  Filled 2020-06-21 (×3): qty 1

## 2020-06-21 MED ORDER — KETOROLAC TROMETHAMINE 30 MG/ML IJ SOLN: 30.0000 mg | Freq: Four times a day (QID) | INTRAMUSCULAR | Status: DC | PRN

## 2020-06-21 MED ORDER — DIPHENHYDRAMINE HCL 25 MG PO CAPS: 25.0000 mg | ORAL_CAPSULE | Freq: Four times a day (QID) | ORAL | Status: DC | PRN

## 2020-06-21 MED ORDER — LIDOCAINE 2% (20 MG/ML) 5 ML SYRINGE
INTRAMUSCULAR | Status: DC | PRN
  Administered 2020-06-21: 60 mg via INTRAVENOUS

## 2020-06-21 MED ORDER — AMISULPRIDE (ANTIEMETIC) 5 MG/2ML IV SOLN
10.0000 mg | Freq: Once | INTRAVENOUS | Status: AC | PRN
  Administered 2020-06-21: 10 mg via INTRAVENOUS

## 2020-06-21 MED ORDER — PHENYLEPHRINE 40 MCG/ML (10ML) SYRINGE FOR IV PUSH (FOR BLOOD PRESSURE SUPPORT)
PREFILLED_SYRINGE | INTRAVENOUS | Status: AC
  Filled 2020-06-21: qty 10

## 2020-06-21 MED ORDER — MIDAZOLAM HCL 5 MG/5ML IJ SOLN
INTRAMUSCULAR | Status: DC | PRN
  Administered 2020-06-21: 2 mg via INTRAVENOUS

## 2020-06-21 MED ORDER — BUPIVACAINE-EPINEPHRINE 0.25% -1:200000 IJ SOLN
INTRAMUSCULAR | Status: DC | PRN
  Administered 2020-06-21: 30 mL

## 2020-06-21 MED ORDER — METFORMIN HCL 500 MG PO TABS: 1000.0000 mg | ORAL_TABLET | Freq: Two times a day (BID) | ORAL | Status: DC

## 2020-06-21 MED ORDER — KETAMINE HCL 10 MG/ML IJ SOLN
INTRAMUSCULAR | Status: DC | PRN
  Administered 2020-06-21: 20 mg via INTRAVENOUS
  Administered 2020-06-21: 30 mg via INTRAVENOUS

## 2020-06-21 MED ORDER — LABETALOL HCL 5 MG/ML IV SOLN
INTRAVENOUS | Status: AC
  Filled 2020-06-21: qty 4

## 2020-06-21 MED ORDER — FENTANYL CITRATE (PF) 100 MCG/2ML IJ SOLN
INTRAMUSCULAR | Status: AC
Start: 2020-06-21 — End: 2020-06-21
  Filled 2020-06-21: qty 2

## 2020-06-21 MED ORDER — VANCOMYCIN HCL IN DEXTROSE 1-5 GM/200ML-% IV SOLN
1000.0000 mg | Freq: Once | INTRAVENOUS | Status: AC
  Administered 2020-06-21: 1000 mg via INTRAVENOUS
  Filled 2020-06-21: qty 200

## 2020-06-21 MED ORDER — 0.9 % SODIUM CHLORIDE (POUR BTL) OPTIME
TOPICAL | Status: DC | PRN
  Administered 2020-06-21: 2000 mL

## 2020-06-21 MED ORDER — BUPIVACAINE LIPOSOME 1.3 % IJ SUSP
INTRAMUSCULAR | Status: DC | PRN
  Administered 2020-06-21: 20 mL

## 2020-06-21 MED ORDER — DEXAMETHASONE SODIUM PHOSPHATE 10 MG/ML IJ SOLN
INTRAMUSCULAR | Status: DC | PRN
  Administered 2020-06-21: 10 mg via INTRAVENOUS

## 2020-06-21 MED ORDER — FENTANYL CITRATE (PF) 100 MCG/2ML IJ SOLN
INTRAMUSCULAR | Status: DC | PRN
Start: 2020-06-21 — End: 2020-06-21
  Administered 2020-06-21 (×3): 50 ug via INTRAVENOUS

## 2020-06-21 MED ORDER — ONDANSETRON HCL 4 MG/2ML IJ SOLN
INTRAMUSCULAR | Status: AC
  Filled 2020-06-21: qty 2

## 2020-06-21 MED ORDER — DIPHENHYDRAMINE HCL 50 MG/ML IJ SOLN: 25.0000 mg | Freq: Four times a day (QID) | INTRAMUSCULAR | Status: DC | PRN

## 2020-06-21 MED ORDER — GABAPENTIN 300 MG PO CAPS
300.0000 mg | ORAL_CAPSULE | Freq: Two times a day (BID) | ORAL | Status: DC
  Administered 2020-06-21 – 2020-06-24 (×6): 300 mg via ORAL
  Filled 2020-06-21 (×6): qty 1

## 2020-06-21 MED ORDER — KETAMINE HCL 10 MG/ML IJ SOLN
INTRAMUSCULAR | Status: AC
  Filled 2020-06-21: qty 1

## 2020-06-21 MED ORDER — FENTANYL CITRATE (PF) 100 MCG/2ML IJ SOLN
25.0000 ug | INTRAMUSCULAR | Status: DC | PRN
  Administered 2020-06-21: 50 ug via INTRAVENOUS
  Administered 2020-06-21 (×4): 25 ug via INTRAVENOUS

## 2020-06-21 MED ORDER — PROPOFOL 10 MG/ML IV BOLUS
INTRAVENOUS | Status: DC | PRN
  Administered 2020-06-21: 150 mg via INTRAVENOUS

## 2020-06-21 MED ORDER — HYDROMORPHONE HCL 1 MG/ML IJ SOLN
0.5000 mg | INTRAMUSCULAR | Status: DC | PRN
  Administered 2020-06-21 – 2020-06-22 (×5): 0.5 mg via INTRAVENOUS
  Filled 2020-06-21 (×5): qty 0.5

## 2020-06-21 MED ORDER — CHLORHEXIDINE GLUCONATE 0.12 % MT SOLN
15.0000 mL | Freq: Once | OROMUCOSAL | Status: AC
  Administered 2020-06-21: 15 mL via OROMUCOSAL

## 2020-06-21 MED ORDER — ONDANSETRON HCL 4 MG/2ML IJ SOLN: 4.0000 mg | Freq: Four times a day (QID) | INTRAMUSCULAR | Status: DC | PRN

## 2020-06-21 MED ORDER — SIMETHICONE 80 MG PO CHEW: 40.0000 mg | CHEWABLE_TABLET | Freq: Four times a day (QID) | ORAL | Status: DC | PRN

## 2020-06-21 MED ORDER — CYCLOBENZAPRINE HCL 10 MG PO TABS
10.0000 mg | ORAL_TABLET | Freq: Every day | ORAL | Status: DC
  Administered 2020-06-22 – 2020-06-24 (×3): 10 mg via ORAL
  Filled 2020-06-21 (×3): qty 1

## 2020-06-21 MED ORDER — LACTATED RINGERS IR SOLN
Status: DC | PRN
  Administered 2020-06-21: 1000 mL

## 2020-06-21 MED ORDER — ONDANSETRON HCL 4 MG PO TABS
4.0000 mg | ORAL_TABLET | Freq: Four times a day (QID) | ORAL | Status: DC | PRN
  Administered 2020-06-22 – 2020-06-23 (×2): 4 mg via ORAL
  Filled 2020-06-21 (×3): qty 1

## 2020-06-21 MED ORDER — LIDOCAINE HCL 2 % IJ SOLN
INTRAMUSCULAR | Status: AC
  Filled 2020-06-21: qty 20

## 2020-06-21 MED ORDER — ENSURE SURGERY PO LIQD
237.0000 mL | Freq: Two times a day (BID) | ORAL | Status: DC
  Administered 2020-06-22 – 2020-06-24 (×5): 237 mL via ORAL

## 2020-06-21 MED ORDER — KCL IN DEXTROSE-NACL 20-5-0.45 MEQ/L-%-% IV SOLN
INTRAVENOUS | Status: DC
  Filled 2020-06-21 (×3): qty 1000

## 2020-06-21 MED ORDER — ACETAMINOPHEN 500 MG PO TABS
1000.0000 mg | ORAL_TABLET | ORAL | Status: AC
  Administered 2020-06-21: 1000 mg via ORAL
  Filled 2020-06-21: qty 2

## 2020-06-21 MED ORDER — ROCURONIUM BROMIDE 10 MG/ML (PF) SYRINGE
PREFILLED_SYRINGE | INTRAVENOUS | Status: DC | PRN
  Administered 2020-06-21: 10 mg via INTRAVENOUS
  Administered 2020-06-21: 40 mg via INTRAVENOUS
  Administered 2020-06-21 (×3): 10 mg via INTRAVENOUS

## 2020-06-21 MED ORDER — SODIUM CHLORIDE 0.9 % IV SOLN
2.0000 g | Freq: Two times a day (BID) | INTRAVENOUS | Status: AC
  Administered 2020-06-21: 2 g via INTRAVENOUS
  Filled 2020-06-21: qty 2

## 2020-06-21 MED ORDER — FUROSEMIDE 40 MG PO TABS
40.0000 mg | ORAL_TABLET | Freq: Every day | ORAL | Status: DC
  Administered 2020-06-21 – 2020-06-24 (×4): 40 mg via ORAL
  Filled 2020-06-21 (×4): qty 1

## 2020-06-21 MED ORDER — INSULIN ASPART 100 UNIT/ML ~~LOC~~ SOLN
SUBCUTANEOUS | Status: AC
  Administered 2020-06-21: 7 [IU]
  Filled 2020-06-21: qty 1

## 2020-06-21 MED ORDER — ENOXAPARIN SODIUM 40 MG/0.4ML ~~LOC~~ SOLN
40.0000 mg | SUBCUTANEOUS | Status: DC
  Administered 2020-06-22 – 2020-06-24 (×3): 40 mg via SUBCUTANEOUS
  Filled 2020-06-21 (×3): qty 0.4

## 2020-06-21 MED ORDER — INSULIN ASPART 100 UNIT/ML ~~LOC~~ SOLN
0.0000 [IU] | Freq: Three times a day (TID) | SUBCUTANEOUS | Status: DC
  Administered 2020-06-21: 7 [IU] via SUBCUTANEOUS
  Administered 2020-06-21 – 2020-06-22 (×3): 11 [IU] via SUBCUTANEOUS
  Administered 2020-06-22 (×2): 7 [IU] via SUBCUTANEOUS
  Administered 2020-06-23: 3 [IU] via SUBCUTANEOUS
  Administered 2020-06-23: 11 [IU] via SUBCUTANEOUS
  Administered 2020-06-23: 15 [IU] via SUBCUTANEOUS
  Administered 2020-06-24: 4 [IU] via SUBCUTANEOUS
  Administered 2020-06-24: 11 [IU] via SUBCUTANEOUS

## 2020-06-21 MED ORDER — MIDAZOLAM HCL 2 MG/2ML IJ SOLN
INTRAMUSCULAR | Status: AC
  Filled 2020-06-21: qty 2

## 2020-06-21 MED ORDER — DEXAMETHASONE SODIUM PHOSPHATE 10 MG/ML IJ SOLN
INTRAMUSCULAR | Status: AC
  Filled 2020-06-21: qty 1

## 2020-06-21 MED ORDER — ENSURE PRE-SURGERY PO LIQD: 296.0000 mL | Freq: Once | ORAL | Status: DC

## 2020-06-21 MED ORDER — AMISULPRIDE (ANTIEMETIC) 5 MG/2ML IV SOLN
INTRAVENOUS | Status: AC
  Filled 2020-06-21: qty 4

## 2020-06-21 MED ORDER — ENSURE PRE-SURGERY PO LIQD: 592.0000 mL | Freq: Once | ORAL | Status: DC

## 2020-06-21 MED ORDER — SACCHAROMYCES BOULARDII 250 MG PO CAPS
250.0000 mg | ORAL_CAPSULE | Freq: Two times a day (BID) | ORAL | Status: DC
  Administered 2020-06-21 – 2020-06-24 (×6): 250 mg via ORAL
  Filled 2020-06-21 (×6): qty 1

## 2020-06-21 MED ORDER — PROPOFOL 10 MG/ML IV BOLUS
INTRAVENOUS | Status: AC
  Filled 2020-06-21: qty 20

## 2020-06-21 MED ORDER — SCOPOLAMINE 1 MG/3DAYS TD PT72
1.0000 | MEDICATED_PATCH | TRANSDERMAL | Status: DC
  Filled 2020-06-21: qty 1

## 2020-06-21 SURGICAL SUPPLY — 87 items
BLADE EXTENDED COATED 6.5IN (ELECTRODE) IMPLANT
CANNULA REDUC XI 12-8 STAPL (CANNULA)
CANNULA REDUCER 12-8 DVNC XI (CANNULA) IMPLANT
CELLS DAT CNTRL 66122 CELL SVR (MISCELLANEOUS) IMPLANT
COVER SURGICAL LIGHT HANDLE (MISCELLANEOUS) ×8 IMPLANT
COVER TIP SHEARS 8 DVNC (MISCELLANEOUS) ×3 IMPLANT
COVER TIP SHEARS 8MM DA VINCI (MISCELLANEOUS) ×3
COVER WAND RF STERILE (DRAPES) ×4 IMPLANT
DECANTER SPIKE VIAL GLASS SM (MISCELLANEOUS) IMPLANT
DERMABOND ADVANCED (GAUZE/BANDAGES/DRESSINGS) ×1
DERMABOND ADVANCED .7 DNX12 (GAUZE/BANDAGES/DRESSINGS) ×3 IMPLANT
DRAIN CHANNEL 19F RND (DRAIN) IMPLANT
DRAPE ARM DVNC X/XI (DISPOSABLE) ×12 IMPLANT
DRAPE COLUMN DVNC XI (DISPOSABLE) ×3 IMPLANT
DRAPE DA VINCI XI ARM (DISPOSABLE) ×4
DRAPE DA VINCI XI COLUMN (DISPOSABLE) ×4
DRAPE SURG IRRIG POUCH 19X23 (DRAPES) ×4 IMPLANT
DRSG OPSITE POSTOP 4X10 (GAUZE/BANDAGES/DRESSINGS) IMPLANT
DRSG OPSITE POSTOP 4X6 (GAUZE/BANDAGES/DRESSINGS) ×4 IMPLANT
DRSG OPSITE POSTOP 4X8 (GAUZE/BANDAGES/DRESSINGS) IMPLANT
ELECT PENCIL ROCKER SW 15FT (MISCELLANEOUS) ×4 IMPLANT
ELECT REM PT RETURN 15FT ADLT (MISCELLANEOUS) ×4 IMPLANT
ENDOLOOP SUT PDS II  0 18 (SUTURE)
ENDOLOOP SUT PDS II 0 18 (SUTURE) IMPLANT
EVACUATOR SILICONE 100CC (DRAIN) IMPLANT
GLOVE BIO SURGEON STRL SZ 6.5 (GLOVE) ×12 IMPLANT
GLOVE BIOGEL PI IND STRL 7.0 (GLOVE) ×6 IMPLANT
GLOVE BIOGEL PI INDICATOR 7.0 (GLOVE) ×2
GOWN STRL REUS W/TWL XL LVL3 (GOWN DISPOSABLE) ×12 IMPLANT
GRASPER SUT TROCAR 14GX15 (MISCELLANEOUS) IMPLANT
HOLDER FOLEY CATH W/STRAP (MISCELLANEOUS) ×4 IMPLANT
IRRIG SUCT STRYKERFLOW 2 WTIP (MISCELLANEOUS) ×4
IRRIGATION SUCT STRKRFLW 2 WTP (MISCELLANEOUS) ×3 IMPLANT
KIT PROCEDURE DA VINCI SI (MISCELLANEOUS)
KIT PROCEDURE DVNC SI (MISCELLANEOUS) IMPLANT
KIT TURNOVER KIT A (KITS) IMPLANT
NEEDLE INSUFFLATION 14GA 120MM (NEEDLE) ×4 IMPLANT
PACK CARDIOVASCULAR III (CUSTOM PROCEDURE TRAY) ×4 IMPLANT
PACK COLON (CUSTOM PROCEDURE TRAY) ×4 IMPLANT
PAD POSITIONING PINK XL (MISCELLANEOUS) ×4 IMPLANT
PORT LAP GEL ALEXIS MED 5-9CM (MISCELLANEOUS) IMPLANT
RELOAD STAPLER 4.3X60 GRN DVNC (STAPLE) ×6 IMPLANT
RTRCTR WOUND ALEXIS 18CM MED (MISCELLANEOUS)
SCISSORS LAP 5X35 DISP (ENDOMECHANICALS) IMPLANT
SEAL CANN UNIV 5-8 DVNC XI (MISCELLANEOUS) ×9 IMPLANT
SEAL XI 5MM-8MM UNIVERSAL (MISCELLANEOUS) ×9
SEALER VESSEL DA VINCI XI (MISCELLANEOUS) ×3
SEALER VESSEL EXT DVNC XI (MISCELLANEOUS) ×3 IMPLANT
SLEEVE ADV FIXATION 5X100MM (TROCAR) ×4 IMPLANT
SOLUTION ELECTROLUBE (MISCELLANEOUS) ×4 IMPLANT
STAPLER 45 DA VINCI SURE FORM (STAPLE)
STAPLER 45 SUREFORM DVNC (STAPLE) IMPLANT
STAPLER 60 DA VINCI SURE FORM (STAPLE) ×2
STAPLER 60 SUREFORM DVNC (STAPLE) ×3 IMPLANT
STAPLER CANNULA SEAL DVNC XI (STAPLE) IMPLANT
STAPLER CANNULA SEAL XI (STAPLE)
STAPLER ECHELON POWER CIR 31 (STAPLE) ×4 IMPLANT
STAPLER RELOAD 4.3X60 GREEN (STAPLE) ×8
STAPLER RELOAD 4.3X60 GRN DVNC (STAPLE) ×6
STOPCOCK 4 WAY LG BORE MALE ST (IV SETS) ×8 IMPLANT
SUT ETHILON 2 0 PS N (SUTURE) IMPLANT
SUT NOVA NAB DX-16 0-1 5-0 T12 (SUTURE) ×8 IMPLANT
SUT PROLENE 2 0 KS (SUTURE) ×4 IMPLANT
SUT SILK 2 0 (SUTURE) ×2
SUT SILK 2 0 SH CR/8 (SUTURE) IMPLANT
SUT SILK 2-0 18XBRD TIE 12 (SUTURE) ×3 IMPLANT
SUT SILK 3 0 (SUTURE)
SUT SILK 3 0 SH CR/8 (SUTURE) ×4 IMPLANT
SUT SILK 3-0 18XBRD TIE 12 (SUTURE) IMPLANT
SUT V-LOC BARB 180 2/0GR6 GS22 (SUTURE)
SUT VIC AB 2-0 SH 18 (SUTURE) IMPLANT
SUT VIC AB 2-0 SH 27 (SUTURE)
SUT VIC AB 2-0 SH 27X BRD (SUTURE) IMPLANT
SUT VIC AB 3-0 SH 18 (SUTURE) IMPLANT
SUT VIC AB 4-0 PS2 27 (SUTURE) ×8 IMPLANT
SUT VICRYL 0 UR6 27IN ABS (SUTURE) ×4 IMPLANT
SUTURE V-LC BRB 180 2/0GR6GS22 (SUTURE) IMPLANT
SYR 10ML ECCENTRIC (SYRINGE) ×4 IMPLANT
SYS LAPSCP GELPORT 120MM (MISCELLANEOUS)
SYSTEM LAPSCP GELPORT 120MM (MISCELLANEOUS) IMPLANT
TOWEL OR 17X26 10 PK STRL BLUE (TOWEL DISPOSABLE) IMPLANT
TOWEL OR NON WOVEN STRL DISP B (DISPOSABLE) ×4 IMPLANT
TRAY FOLEY MTR SLVR 14FR STAT (SET/KITS/TRAYS/PACK) ×4 IMPLANT
TRAY FOLEY MTR SLVR 16FR STAT (SET/KITS/TRAYS/PACK) IMPLANT
TROCAR ADV FIXATION 5X100MM (TROCAR) ×4 IMPLANT
TUBING CONNECTING 10 (TUBING) ×16 IMPLANT
TUBING INSUFFLATION 10FT LAP (TUBING) ×4 IMPLANT

## 2020-06-21 NOTE — H&P (Signed)
The patient is a 54 year old female who presents with colorectal cancer. 54 year old female who presents to the office with a newly diagnosed rectal cancer. She underwent a colonoscopy with Dr. Melina Copa in [place] for rectal bleeding. This showed a proximal rectal mass approximately 10 cm from the anus. Biopsy show adenocarcinoma. CT scan of the abdomen and pelvis shows no sign of metastatic disease, but some enlarged lymph nodes around the periaortic chain. MRI was performed which showed a T3 proximal rectal lesion approximally 6 cm in length with the distal edge being 8.6 cm to the internal anal sphincter. T3N0. She has underwent port placement and states that she completed chemotherapy and RT in early July.    Problem List/Past Medical Leighton Ruff, MD; 5/73/2202 11:41 AM) RECTAL CANCER (C20)  Past Surgical History Leighton Ruff, MD; 5/42/7062 11:41 AM) Hysterectomy (not due to cancer) - Partial  Diagnostic Studies History Leighton Ruff, MD; 3/76/2831 11:41 AM) Mammogram >3 years ago  Allergies (Chanel Teressa Senter, CMA; 04/25/2020 11:22 AM) Bactrim *ANTI-INFECTIVE AGENTS - MISC.* Sulfa-Mag *MINERALS & ELECTROLYTES* Allergies Reconciled  Medication History  oxyCODONE HCl (20MG  Tablet, Oral) Active. BD Pen Needle Short U/F (31G X 8 MM Misc,) Active. Cyclobenzaprine HCl (10MG  Tablet, Oral) Active. Fenofibrate (145MG  Tablet, Oral) Active. Insulin Lispro (100UNIT/ML Solution, Subcutaneous) Active. metFORMIN HCl (1000MG  Tablet, Oral) Active. Linzess (145MCG Capsule, Oral) Active. Ondansetron (4MG  Tablet Disint, Oral) Active. Tyler Aas FlexTouch (100UNIT/ML Soln Pen-inj, Subcutaneous) Active. Furosemide (20MG  Tablet, Oral) Active. Rosuvastatin Calcium (20MG  Tablet, Oral) Active.  Social History Leighton Ruff, MD; 02/28/6159 11:41 AM) Caffeine use Carbonated beverages, Coffee, Tea. No alcohol use No drug use Tobacco use Never smoker.  Family History  Leighton Ruff, MD; 7/37/1062 11:41 AM) Breast Cancer Mother. Heart Disease Brother. Prostate Cancer Father.  Pregnancy / Birth History Leighton Ruff, MD; 6/94/8546 11:41 AM) Age at menarche 24 years. Age of menopause 20-50 Gravida 1 Para 1 Regular periods  Other Problems Leighton Ruff, MD; 2/70/3500 11:41 AM) Anxiety Disorder Back Pain Depression Diabetes Mellitus Gastroesophageal Reflux Disease Hypercholesterolemia     Review of Systems  General Not Present- Appetite Loss, Chills, Fatigue, Fever, Night Sweats, Weight Gain and Weight Loss. Skin Not Present- Change in Wart/Mole, Dryness, Hives, Jaundice, New Lesions, Non-Healing Wounds, Rash and Ulcer. HEENT Not Present- Earache, Hearing Loss, Hoarseness, Nose Bleed, Oral Ulcers, Ringing in the Ears, Seasonal Allergies, Sinus Pain, Sore Throat, Visual Disturbances, Wears glasses/contact lenses and Yellow Eyes. Respiratory Present- Snoring. Not Present- Bloody sputum, Chronic Cough, Difficulty Breathing and Wheezing. Breast Not Present- Breast Mass, Breast Pain, Nipple Discharge and Skin Changes. Cardiovascular Not Present- Chest Pain, Difficulty Breathing Lying Down, Leg Cramps, Palpitations, Rapid Heart Rate, Shortness of Breath and Swelling of Extremities. Gastrointestinal Present- Bloating, Bloody Stool and Rectal Pain. Not Present- Abdominal Pain, Change in Bowel Habits, Chronic diarrhea, Constipation, Difficulty Swallowing, Excessive gas, Gets full quickly at meals, Hemorrhoids, Indigestion, Nausea and Vomiting. Musculoskeletal Present- Back Pain, Joint Pain, Muscle Pain and Muscle Weakness. Not Present- Joint Stiffness and Swelling of Extremities. Neurological Present- Numbness. Not Present- Decreased Memory, Fainting, Headaches, Seizures, Tingling, Tremor, Trouble walking and Weakness. Psychiatric Present- Anxiety and Depression. Not Present- Bipolar, Change in Sleep Pattern, Fearful and Frequent  crying. Endocrine Present- New Diabetes. Not Present- Cold Intolerance, Excessive Hunger, Hair Changes, Heat Intolerance and Hot flashes. Hematology Present- Easy Bruising. Not Present- Blood Thinners, Excessive bleeding, Gland problems, HIV and Persistent Infections.  BP (!) 164/79   Pulse 81   Temp 97.9 F (36.6 C) (Oral)   Resp 18  Ht 5\' 3"  (1.6 m)   Wt 77.1 kg   SpO2 98%   BMI 30.11 kg/m     Physical Exam  General Mental Status-Alert. General Appearance-Not in acute distress. Build & Nutrition-Well nourished. Posture-Normal posture. Gait-Normal.  Head and Neck Head-normocephalic, atraumatic with no lesions or palpable masses. Trachea-midline.  Chest and Lung Exam Inspection Movements - Symmetrical.  Abdomen Inspection Inspection of the abdomen reveals - No Hernias. Palpation/Percussion Palpation and Percussion of the abdomen reveal - Soft, Non Tender and No Rigidity (guarding).  Rectal Anorectal Exam External - normal sphincter . Internal - normal sphincter tone.  Neurologic Neurologic evaluation reveals -alert and oriented x 3 with no impairment of recent or remote memory, normal attention span and ability to concentrate, normal sensation and normal coordination.  Musculoskeletal Normal Exam - Bilateral-Upper Extremity Strength Normal and Lower Extremity Strength Normal.    Assessment & Plan   RECTAL CANCER (C20) Impression: 54 year old female with a proximal rectal cancer treated with neoadjuvant chemotherapy and radiation. This was completed in early July 2021. I have recommended that we proceed with robotic-assisted low anterior resection and possible diverting ileostomy in September 2021. We have discussed typical risk associated with surgery as well as postoperative recovery times and symptoms associated with low anterior resection. All questions were answered. Patient is on chronic narcotics for back pain and is aware  that she will not be able to the prescribed additional narcotics upon leaving the hospital unless it is done with her prescribing physician. The surgery and anatomy were described to the patient as well as the risks of surgery and the possible complications. These include: Bleeding, deep abdominal infections and possible wound complications such as hernia and infection, damage to adjacent structures, leak of surgical connections, which can lead to other surgeries and possibly an ostomy, possible need for other procedures, such as abscess drains in radiology, possible prolonged hospital stay, possible diarrhea from removal of part of the colon, possible constipation from narcotics, possible bowel, bladder or sexual dysfunction if having rectal surgery, prolonged fatigue/weakness or appetite loss, possible early recurrence of of disease, possible complications of their medical problems such as heart disease or arrhythmias or lung problems, death (less than 1%). I believe the patient

## 2020-06-21 NOTE — Op Note (Signed)
06/21/2020  11:42 AM  PATIENT:  Gloria Hodge  54 y.o. female  Patient Care Team: [name], [name], MD as PCP - General (Internal Medicine)  PRE-OPERATIVE DIAGNOSIS:  Proximal rectal cancer  POST-OPERATIVE DIAGNOSIS:  Proximal rectal cancer  PROCEDURE:   XI ROBOTIC ASSISTED LOWER ANTERIOR RESECTION FLEXIBLE SIGMOIDOSCOPY    Surgeon(s): Leighton Ruff, MD Michael Boston, MD  ASSISTANT: Dr Johney Maine   ANESTHESIA:   local and general  EBL: 61ml Total I/O In: 1100 [I.V.:1000; IV Piggyback:100] Out: -   Delay start of Pharmacological VTE agent (>24hrs) due to surgical blood loss or risk of bleeding:  no  DRAINS: none   SPECIMEN:  Source of Specimen:  Distal sigmoid and proximal rectum  DISPOSITION OF SPECIMEN:  PATHOLOGY  COUNTS:  YES  PLAN OF CARE: Admit to inpatient   PATIENT DISPOSITION:  PACU - hemodynamically stable.  INDICATION:    54 year old female who presented to the office with a proximal rectal tumor.  MRI showed T3N0 mass.  She underwent preoperative chemotherapy and radiation.  I recommended low anterior resection:  The anatomy & physiology of the digestive tract was discussed.  The pathophysiology was discussed.  Natural history risks without surgery was discussed.   I worked to give an overview of the disease and the frequent need to have multispecialty involvement.  I feel the risks of no intervention will lead to serious problems that outweigh the operative risks; therefore, I recommended a partial colectomy to remove the pathology.  Laparoscopic & open techniques were discussed.   Risks such as bleeding, infection, abscess, leak, reoperation, possible ostomy, hernia, heart attack, death, and other risks were discussed.  I noted a good likelihood this will help address the problem.   Goals of post-operative recovery were discussed as well.    The patient expressed understanding & wished to proceed with surgery.  OR FINDINGS:   Patient had a  clinical complete response.  No obvious metastatic disease on visceral parietal peritoneum or liver.  The anastomosis rests 10 cm from the anal verge by rigid proctoscopy.  DESCRIPTION:   Informed consent was confirmed.  The patient underwent general anaesthesia without difficulty.  The patient was positioned appropriately.  VTE prevention in place.  The patient's abdomen was clipped, prepped, & draped in a sterile fashion.  Surgical timeout confirmed our plan.  The patient was positioned in reverse Trendelenburg.  Abdominal entry was gained using a varies needle in the left upper quadrant.  Entry was clean.  I induced carbon dioxide insufflation.  An 21mm robotic port was placed in the right upper quadrant.  Camera inspection revealed no injury.  Extra ports were carefully placed under direct laparoscopic visualization.  I laparoscopically reflected the greater omentum and the upper abdomen the small bowel in the upper abdomen. The patient was appropriately positioned and the robot was docked to the patient's left side.  Instruments were placed under direct visualization.    I mobilized the sigmoid colon off of the pelvic sidewall.  The mass could not be identified by visualization or tactile sensation.  I decided to perform an on table flexible sigmoidoscopy.  I identified a small scar in the left lateral proximal rectum approximately 15 cm from the anal verge.  An area distal to this was marked.  I scored the base of peritoneum of the right side of the mesentery of the left colon from the ligament of Treitz to the peritoneal reflection of the mid rectum.  I elevated the sigmoid mesentery  and enetered into the retro-mesenteric plane. We were able to identify the left ureter and gonadal vessels. We kept those posterior within the retroperitoneum and elevated the left colon mesentery off that. I did isolated IMA pedicle but did not ligate it yet.  I continued distally and got into the avascular plane  posterior to the mesorectum. This allowed me to help mobilize the rectum as well by freeing the mesorectum off the sacrum.  I mobilized the peritoneal coverings towards the peritoneal reflection on both the right and left sides of the rectum.  I could see the right and left ureters and stayed away from them.   I skeletonized the inferior mesenteric artery pedicle.  I went down to its takeoff from the aorta.  After confirming the left ureter was out of the way, I went ahead and ligated the inferior mesenteric artery pedicle with bipolar robotic vessel sealer ~2cm above its takeoff from the aorta.  We ensured hemostasis. I skeletonized the mesorectum at the junction at the proximal rectum using blunt dissection & bipolar robotic vessel sealer.  I mobilized the left colon in a lateral to medial fashion off the line of Toldt up towards the splenic flexure to ensure good mobilization of the left colon to reach into the pelvis.  I then reevaluated the Marjan using a flexible sigmoidoscope.  The mesorectal dissection was just at the level of the ulceration.  Therefore I dissected the rectum further approximately 2 cm and divided the mesorectum again using the robotic vessel sealer.  I then divided the proximal rectum using a green load robotic stapler x2.  Hemostasis was good.  The colon easily mobilized down into the pelvis for an anastomosis.  I divided the mesentery to the level of the descending sigmoid junction using a robotic vessel sealer.  The robot was then undocked.  My 12 mm suprapubic port was expanded into a Pfannenstiel incision.  An Atkinson wound protector was placed.  The colon was brought out and divided proximally using a pursestring device.  A 2-0 Prolene pursestring was placed.  This was secured with interrupted 3-0 silk sutures.  A 31 mm EEA anvil was placed into the colon and the pursestring was tied tightly around this.  This was then placed back into the abdomen and anastomosis was created under  laparoscopic visualization.  There was no tension noted.  There was no leak when tested with insufflation under irrigation.  The remaining ports were removed.  The Greenfield wound protector was removed.  We then switched to clean gowns, gloves, instruments and drapes.  The Pfannenstiel peritoneum was closed using a running 0 Vicryl suture.  The fascia was reapproximated using interrupted #1 Novafil sutures.  Subcutaneous tissue was reapproximated using a running 2-0 Vicryl suture and the skin was closed using a running 4-0 Vicryl subcuticular suture.  A sterile dressing was applied.  The remaining port sites were closed using 4-0 Vicryl subcuticular suture and Dermabond.  Patient was then awakened from anesthesia and sent to the postanesthesia care unit in stable condition.  All counts were correct per operating room staff.  An MD assistant was necessary for tissue manipulation, retraction and positioning due to the complexity of the case and hospital policies

## 2020-06-21 NOTE — Anesthesia Procedure Notes (Signed)
Procedure Name: Intubation Date/Time: 06/21/2020 8:35 AM Performed by: Gerald Leitz, CRNA Pre-anesthesia Checklist: Patient identified, Patient being monitored, Timeout performed, Emergency Drugs available and Suction available Patient Re-evaluated:Patient Re-evaluated prior to induction Oxygen Delivery Method: Circle system utilized Preoxygenation: Pre-oxygenation with 100% oxygen Induction Type: IV induction Ventilation: Mask ventilation without difficulty Laryngoscope Size: Mac and 3 Grade View: Grade I Tube type: Oral Tube size: 7.0 mm Number of attempts: 1 Placement Confirmation: ETT inserted through vocal cords under direct vision,  positive ETCO2 and breath sounds checked- equal and bilateral Secured at: 21 cm Tube secured with: Tape Dental Injury: Teeth and Oropharynx as per pre-operative assessment

## 2020-06-21 NOTE — Anesthesia Preprocedure Evaluation (Signed)
Anesthesia Evaluation  Patient identified by MRN, date of birth, ID band Patient awake    Reviewed: Allergy & Precautions, NPO status , Patient's Chart, lab work & pertinent test results  Airway Mallampati: II  TM Distance: >3 FB Neck ROM: Full    Dental  (+) Dental Advisory Given   Pulmonary neg pulmonary ROS,    breath sounds clear to auscultation       Cardiovascular negative cardio ROS   Rhythm:Regular Rate:Normal     Neuro/Psych negative neurological ROS     GI/Hepatic negative GI ROS, Neg liver ROS,   Endo/Other  diabetes, Type 2  Renal/GU Renal InsufficiencyRenal disease     Musculoskeletal  (+) Arthritis ,   Abdominal   Peds  Hematology negative hematology ROS (+) anemia ,   Anesthesia Other Findings   Reproductive/Obstetrics                             Lab Results  Component Value Date   WBC 7.0 06/09/2020   HGB 11.5 (L) 06/09/2020   HCT 35.8 (L) 06/09/2020   MCV 85.6 06/09/2020   PLT 302 06/09/2020   Lab Results  Component Value Date   CREATININE 1.23 (H) 06/09/2020   BUN 26 (H) 06/09/2020   NA 141 06/09/2020   K 4.2 06/09/2020   CL 102 06/09/2020   CO2 29 06/09/2020    Anesthesia Physical Anesthesia Plan  ASA: II  Anesthesia Plan: General   Post-op Pain Management:    Induction: Intravenous  PONV Risk Score and Plan: 3 and Dexamethasone, Ondansetron, Treatment may vary due to age or medical condition, Midazolam and Scopolamine patch - Pre-op  Airway Management Planned: Oral ETT  Additional Equipment: None  Intra-op Plan:   Post-operative Plan: Extubation in OR  Informed Consent: I have reviewed the patients History and Physical, chart, labs and discussed the procedure including the risks, benefits and alternatives for the proposed anesthesia with the patient or authorized representative who has indicated his/her understanding and acceptance.      Dental advisory given  Plan Discussed with: CRNA  Anesthesia Plan Comments:         Anesthesia Quick Evaluation

## 2020-06-21 NOTE — Anesthesia Postprocedure Evaluation (Signed)
Anesthesia Post Note  Patient: Gloria Hodge  Procedure(s) Performed: XI ROBOTIC ASSISTED LOWER ANTERIOR RESECTION (N/A Abdomen) FLEXIBLE SIGMOIDOSCOPY (Rectum)     Patient location during evaluation: PACU Anesthesia Type: General Level of consciousness: awake and alert Pain management: pain level controlled Vital Signs Assessment: post-procedure vital signs reviewed and stable Respiratory status: spontaneous breathing, nonlabored ventilation, respiratory function stable and patient connected to nasal cannula oxygen Cardiovascular status: blood pressure returned to baseline and stable Postop Assessment: no apparent nausea or vomiting Anesthetic complications: no   No complications documented.  Last Vitals:  Vitals:   06/21/20 1530 06/21/20 1621  BP: (!) 162/78 (!) 166/78  Pulse: 91 90  Resp: 18 18  Temp: 36.8 C 36.6 C  SpO2: 100% 98%    Last Pain:  Vitals:   06/21/20 1659  TempSrc:   PainSc: 7                  Tiajuana Amass

## 2020-06-21 NOTE — Transfer of Care (Signed)
Immediate Anesthesia Transfer of Care Note  Patient: Gloria Hodge  Procedure(s) Performed: Procedure(s): XI ROBOTIC ASSISTED LOWER ANTERIOR RESECTION (N/A) FLEXIBLE SIGMOIDOSCOPY  Patient Location: PACU  Anesthesia Type:General  Level of Consciousness: Alert, Awake, Oriented  Airway & Oxygen Therapy: Patient Spontanous Breathing  Post-op Assessment: Report given to RN  Post vital signs: Reviewed and stable  Last Vitals:  Vitals:   06/21/20 0707  BP: (!) 164/79  Pulse: 81  Resp: 18  Temp: 36.6 C  SpO2: 29%    Complications: No apparent anesthesia complications

## 2020-06-22 ENCOUNTER — Encounter (HOSPITAL_COMMUNITY): Payer: Self-pay | Admitting: General Surgery

## 2020-06-22 LAB — CBC
HCT: 30.7 % — ABNORMAL LOW (ref 36.0–46.0)
Hemoglobin: 10 g/dL — ABNORMAL LOW (ref 12.0–15.0)
MCH: 27.5 pg (ref 26.0–34.0)
MCHC: 32.6 g/dL (ref 30.0–36.0)
MCV: 84.3 fL (ref 80.0–100.0)
Platelets: 278 10*3/uL (ref 150–400)
RBC: 3.64 MIL/uL — ABNORMAL LOW (ref 3.87–5.11)
RDW: 14.5 % (ref 11.5–15.5)
WBC: 6.6 10*3/uL (ref 4.0–10.5)
nRBC: 0 % (ref 0.0–0.2)

## 2020-06-22 LAB — BASIC METABOLIC PANEL
Anion gap: 6 (ref 5–15)
BUN: 17 mg/dL (ref 6–20)
CO2: 26 mmol/L (ref 22–32)
Calcium: 8.8 mg/dL — ABNORMAL LOW (ref 8.9–10.3)
Chloride: 104 mmol/L (ref 98–111)
Creatinine, Ser: 1.07 mg/dL — ABNORMAL HIGH (ref 0.44–1.00)
GFR calc Af Amer: >60 mL/min (ref >60)
GFR calc non Af Amer: 60 mL/min — ABNORMAL LOW (ref >60)
Glucose, Bld: 240 mg/dL — ABNORMAL HIGH (ref 70–99)
Potassium: 3.7 mmol/L (ref 3.5–5.1)
Sodium: 136 mmol/L (ref 135–145)

## 2020-06-22 LAB — GLUCOSE, CAPILLARY
Glucose-Capillary: 206 mg/dL — ABNORMAL HIGH (ref 70–99)
Glucose-Capillary: 248 mg/dL — ABNORMAL HIGH (ref 70–99)
Glucose-Capillary: 295 mg/dL — ABNORMAL HIGH (ref 70–99)
Glucose-Capillary: 190 mg/dL — ABNORMAL HIGH (ref 70–99)

## 2020-06-22 MED ORDER — METFORMIN HCL 500 MG PO TABS
1000.0000 mg | ORAL_TABLET | Freq: Two times a day (BID) | ORAL | Status: DC
  Administered 2020-06-22 – 2020-06-24 (×5): 1000 mg via ORAL
  Filled 2020-06-22 (×5): qty 2

## 2020-06-22 MED ORDER — INSULIN GLARGINE 100 UNIT/ML ~~LOC~~ SOLN
30.0000 [IU] | Freq: Every day | SUBCUTANEOUS | Status: DC
  Administered 2020-06-22 – 2020-06-23 (×2): 30 [IU] via SUBCUTANEOUS
  Filled 2020-06-22 (×2): qty 0.3

## 2020-06-22 MED ORDER — MENTHOL 3 MG MT LOZG
1.0000 | LOZENGE | OROMUCOSAL | Status: DC | PRN
  Filled 2020-06-22: qty 9

## 2020-06-22 MED ORDER — CHLORHEXIDINE GLUCONATE CLOTH 2 % EX PADS: 6.0000 | MEDICATED_PAD | Freq: Every day | CUTANEOUS | Status: DC

## 2020-06-22 NOTE — Progress Notes (Signed)
Inpatient Diabetes Program Recommendations  AACE/ADA: New Consensus Statement on Inpatient Glycemic Control (2015)  Target Ranges:  Prepandial:   less than 140 mg/dL      Peak postprandial:   less than 180 mg/dL (1-2 hours)      Critically ill patients:  140 - 180 mg/dL   Lab Results  Component Value Date   GLUCAP 248 (H) 06/22/2020   HGBA1C 9.8 (H) 06/09/2020    Review of Glycemic Control  Diabetes history: DM2 Outpatient Diabetes medications: Tresiba 60 units QD, Humalog 20-30 units tidwc, metformin 1000 mg bid Current orders for Inpatient glycemic control: Novolog 0-20 units tidwc and hs, metformin 1000 mg bid  Will need portion of basal insulin. Secure text to RN requesting same.   Inpatient Diabetes Program Recommendations:     Lantus 30 units QHS   Orders placed by RN after speaking with Dr Marcello Moores.  Will follow glucose trends.   Thank you. Lorenda Peck, RD, LDN, CDE Inpatient Diabetes Coordinator [phone]

## 2020-06-22 NOTE — Progress Notes (Signed)
1 Day Post-Op Robotic LAR Subjective: Doing well, pain controlled, tolerating liquids, no nausea, having bowel function  Objective: Vital signs in last 24 hours: Temp:  [97.3 F (36.3 C)-99.1 F (37.3 C)] 99 F (37.2 C) (09/09 0643) Pulse Rate:  [80-97] 89 (09/09 0643) Resp:  [12-23] 16 (09/09 0643) BP: (135-175)/(60-93) 135/61 (09/09 0643) SpO2:  [97 %-100 %] 100 % (09/09 0643) Weight:  [78.9 kg] 78.9 kg (09/09 0643)   Intake/Output from previous day: 09/08 0701 - 09/09 0700 In: 3891.8 [P.O.:720; I.V.:2971.8; IV Piggyback:200] Out: 4600 [Urine:4525; Blood:75] Intake/Output this shift: No intake/output data recorded.   General appearance: alert and cooperative GI: normal findings: soft, non-tender  Incision: no significant drainage  Lab Results:  Recent Labs    06/22/20 0258  WBC 6.6  HGB 10.0*  HCT 30.7*  PLT 278   BMET Recent Labs    06/22/20 0258  NA 136  K 3.7  CL 104  CO2 26  GLUCOSE 240*  BUN 17  CREATININE 1.07*  CALCIUM 8.8*   PT/INR No results for input(s): LABPROT, INR in the last 72 hours. ABG No results for input(s): PHART, HCO3 in the last 72 hours.  Invalid input(s): PCO2, PO2  MEDS, Scheduled . acetaminophen  1,000 mg Oral Q6H  . alvimopan  12 mg Oral BID  . Chlorhexidine Gluconate Cloth  6 each Topical Daily  . cyclobenzaprine  10 mg Oral Daily  . enoxaparin (LOVENOX) injection  40 mg Subcutaneous Q24H  . feeding supplement  237 mL Oral BID BM  . furosemide  40 mg Oral Daily  . gabapentin  300 mg Oral BID  . insulin aspart  0-20 Units Subcutaneous TID WC  . insulin aspart  0-5 Units Subcutaneous QHS  . metFORMIN  1,000 mg Oral BID WC  . saccharomyces boulardii  250 mg Oral BID  . sertraline  100 mg Oral Daily  . zolpidem  5 mg Oral QHS    Studies/Results: No results found.  Assessment: s/p Procedure(s): XI ROBOTIC ASSISTED LOWER ANTERIOR RESECTION FLEXIBLE SIGMOIDOSCOPY Patient Active Problem List   Diagnosis Date  Noted  . Rectal cancer ([place]) 06/21/2020  . History of amputation of lesser toe of both feet ([place]) 10/26/2019  . Onychomycosis 10/26/2019    Expected post op course  Plan: d/c foley Advance diet  Ambulate in hall   LOS: 1 day     .Rosario Adie, MD [facility], [place]    06/22/2020 8:33 AM

## 2020-06-23 LAB — CBC
HCT: 30.7 % — ABNORMAL LOW (ref 36.0–46.0)
Hemoglobin: 9.9 g/dL — ABNORMAL LOW (ref 12.0–15.0)
MCH: 27.7 pg (ref 26.0–34.0)
MCHC: 32.2 g/dL (ref 30.0–36.0)
MCV: 85.8 fL (ref 80.0–100.0)
Platelets: 275 10*3/uL (ref 150–400)
RBC: 3.58 MIL/uL — ABNORMAL LOW (ref 3.87–5.11)
RDW: 14.7 % (ref 11.5–15.5)
WBC: 9.2 10*3/uL (ref 4.0–10.5)
nRBC: 0 % (ref 0.0–0.2)

## 2020-06-23 LAB — BASIC METABOLIC PANEL
Anion gap: 7 (ref 5–15)
BUN: 27 mg/dL — ABNORMAL HIGH (ref 6–20)
CO2: 26 mmol/L (ref 22–32)
Calcium: 8.6 mg/dL — ABNORMAL LOW (ref 8.9–10.3)
Chloride: 103 mmol/L (ref 98–111)
Creatinine, Ser: 1.49 mg/dL — ABNORMAL HIGH (ref 0.44–1.00)
GFR calc Af Amer: 46 mL/min — ABNORMAL LOW (ref >60)
GFR calc non Af Amer: 40 mL/min — ABNORMAL LOW (ref >60)
Glucose, Bld: 179 mg/dL — ABNORMAL HIGH (ref 70–99)
Potassium: 3.8 mmol/L (ref 3.5–5.1)
Sodium: 136 mmol/L (ref 135–145)

## 2020-06-23 LAB — GLUCOSE, CAPILLARY
Glucose-Capillary: 306 mg/dL — ABNORMAL HIGH (ref 70–99)
Glucose-Capillary: 136 mg/dL — ABNORMAL HIGH (ref 70–99)
Glucose-Capillary: 271 mg/dL — ABNORMAL HIGH (ref 70–99)
Glucose-Capillary: 229 mg/dL — ABNORMAL HIGH (ref 70–99)

## 2020-06-23 NOTE — Progress Notes (Signed)
2 Days Post-Op Robotic LAR Subjective: Doing well, having a bit more pain, tolerating solid foods, no nausea, having bowel function  Objective: Vital signs in last 24 hours: Temp:  [98.6 F (37 C)-99.4 F (37.4 C)] 99.4 F (37.4 C) (09/10 0600) Pulse Rate:  [93-102] 93 (09/10 0600) Resp:  [17-18] 17 (09/10 0600) BP: (144-154)/(69-89) 144/72 (09/10 0600) SpO2:  [96 %-100 %] 99 % (09/10 0600)   Intake/Output from previous day: 09/09 0701 - 09/10 0700 In: 1168.5 [P.O.:880; I.V.:288.5] Out: 3750 [Urine:3750] Intake/Output this shift: No intake/output data recorded.   General appearance: alert and cooperative GI: normal findings: soft, non-tender  Incision: no significant drainage  Lab Results:  Recent Labs    06/22/20 0258 06/23/20 0305  WBC 6.6 9.2  HGB 10.0* 9.9*  HCT 30.7* 30.7*  PLT 278 275   BMET Recent Labs    06/22/20 0258 06/23/20 0305  NA 136 136  K 3.7 3.8  CL 104 103  CO2 26 26  GLUCOSE 240* 179*  BUN 17 27*  CREATININE 1.07* 1.49*  CALCIUM 8.8* 8.6*   PT/INR No results for input(s): LABPROT, INR in the last 72 hours. ABG No results for input(s): PHART, HCO3 in the last 72 hours.  Invalid input(s): PCO2, PO2  MEDS, Scheduled . acetaminophen  1,000 mg Oral Q6H  . Chlorhexidine Gluconate Cloth  6 each Topical Daily  . cyclobenzaprine  10 mg Oral Daily  . enoxaparin (LOVENOX) injection  40 mg Subcutaneous Q24H  . feeding supplement  237 mL Oral BID BM  . furosemide  40 mg Oral Daily  . gabapentin  300 mg Oral BID  . insulin aspart  0-20 Units Subcutaneous TID WC  . insulin aspart  0-5 Units Subcutaneous QHS  . insulin glargine  30 Units Subcutaneous QHS  . metFORMIN  1,000 mg Oral BID WC  . saccharomyces boulardii  250 mg Oral BID  . sertraline  100 mg Oral Daily  . zolpidem  5 mg Oral QHS    Studies/Results: No results found.  Assessment: s/p Procedure(s): XI ROBOTIC ASSISTED LOWER ANTERIOR RESECTION FLEXIBLE  SIGMOIDOSCOPY Patient Active Problem List   Diagnosis Date Noted  . Rectal cancer ([place]) 06/21/2020  . History of amputation of lesser toe of both feet ([place]) 10/26/2019  . Onychomycosis 10/26/2019    Expected post op course  Plan: Plan for d/c tomorrow CC diet  Ambulate in hall   LOS: 2 days     .Rosario Adie, MD [facility], [place]    06/23/2020 10:54 AM

## 2020-06-23 NOTE — Progress Notes (Signed)
Inpatient Diabetes Program Recommendations  AACE/ADA: New Consensus Statement on Inpatient Glycemic Control (2015)  Target Ranges:  Prepandial:   less than 140 mg/dL      Peak postprandial:   less than 180 mg/dL (1-2 hours)      Critically ill patients:  140 - 180 mg/dL   Lab Results  Component Value Date   GLUCAP 271 (H) 06/23/2020   HGBA1C 9.8 (H) 06/09/2020    Review of Glycemic Control  306, 136, 271 mg/dL  Titrate both Lantus and Novolog.  Inpatient Diabetes Program Recommendations:     Increase Lantus to 50 units QHS Increase Novolog to 0-20 units tidwc and hs  Continue to follow.   Thank you. Lorenda Peck, RD, LDN, CDE Inpatient Diabetes Coordinator [phone]

## 2020-06-23 NOTE — Discharge Instructions (Signed)

## 2020-06-23 NOTE — Care Management Important Message (Signed)
Important Message  Patient Details IM Letter given to the Patient Name: Gloria Hodge MRN: [id number] Date of Birth: [birth year]   Medicare Important Message Given:  Yes     Kerin Salen 06/23/2020, 12:10 PM

## 2020-06-23 NOTE — Progress Notes (Signed)
Pharmacy Brief Note - Alvimopan (Entereg)  The standing order set for alvimopan (Entereg) now includes an automatic order to discontinue the drug after the patient has had a bowel movement. The change was approved by the Pharmacy & Therapeutics Committee and the Medical Executive Committee.   This patient has had bowel movements documented by nursing. Therefore, alvimopan has been discontinued. If there are questions, please contact the pharmacy at [phone].   Thank you- Edgard Debord, PharmD, BCPS 

## 2020-06-23 NOTE — Plan of Care (Signed)

## 2020-06-24 LAB — GLUCOSE, CAPILLARY
Glucose-Capillary: 157 mg/dL — ABNORMAL HIGH (ref 70–99)
Glucose-Capillary: 285 mg/dL — ABNORMAL HIGH (ref 70–99)

## 2020-06-24 NOTE — Progress Notes (Signed)
Assessment unchanged.Pt verbalized understanding of dc instructions through teach back. No prescriptions. Discharged via foot per request to front entrance accompanied by RN.

## 2020-06-26 LAB — SURGICAL PATHOLOGY

## 2020-06-27 NOTE — Discharge Summary (Signed)
Patient ID: Gloria Hodge [id number] 53 y.o. 05/31/1966  06/21/2020  Discharge date and time: 06/24/2020 12:50 PM  Admitting Physician: Rosario Adie  Discharge Physician: Rosario Adie  Admission Diagnoses: Rectal cancer [facility]) [place]  Discharge Diagnoses: rectal cancer  Operations:  XI ROBOTIC ASSISTED LOWER ANTERIOR RESECTION FLEXIBLE SIGMOIDOSCOPY   Discharged Condition: good    Hospital Course: patient was admitted to the hospital after low anterior resection.  Her diet was advanced as tolerated.  Her Foley catheter was removed on postop day 1.  She began to ambulate without difficulty.  She was felt to be in stable condition for discharge to home on postop day 3.  She'll follow-up in the office in 2 weeks.  Consults: None  Significant Diagnostic Studies: labs: cbc, bmet  Treatments: IV hydration, analgesia: Oxycodone and surgery: LAR  Disposition: Home

## 2020-07-03 ENCOUNTER — Other Ambulatory Visit: Payer: Self-pay

## 2020-07-03 ENCOUNTER — Ambulatory Visit (INDEPENDENT_AMBULATORY_CARE_PROVIDER_SITE_OTHER): Payer: 59 | Admitting: Podiatry

## 2020-07-03 ENCOUNTER — Encounter: Payer: Self-pay | Admitting: Podiatry

## 2020-07-03 DIAGNOSIS — L600 Ingrowing nail: Secondary | ICD-10-CM

## 2020-07-03 DIAGNOSIS — M79676 Pain in unspecified toe(s): Secondary | ICD-10-CM

## 2020-07-03 DIAGNOSIS — E1142 Type 2 diabetes mellitus with diabetic polyneuropathy: Secondary | ICD-10-CM

## 2020-07-03 DIAGNOSIS — L03031 Cellulitis of right toe: Secondary | ICD-10-CM | POA: Diagnosis not present

## 2020-07-03 MED ORDER — DOXYCYCLINE HYCLATE 100 MG PO TABS: 100.0000 mg | ORAL_TABLET | Freq: Two times a day (BID) | ORAL | 0 refills | Status: DC

## 2020-07-03 NOTE — Progress Notes (Signed)
  Subjective:  Patient ID: Gloria Hodge, female    DOB: 24-Apr-1966,  MRN: [id number]  Chief Complaint  Patient presents with  . Nail Problem    3rd toe on the right i pulled the nail off and started out as a scab and i pulled that off     54 y.o. female presents for wound care. Hx confirmed with patient.  Objective:  Physical Exam: Right 3rd toe with ingrown nail lateral border, scant erythema right 3rd toe. Toe xerotic with cracking. No other open wounds Assessment:   1. Ingrown nail   2. Pain around toenail   3. DM type 2 with diabetic peripheral neuropathy (HCC)   4. Cellulitis of third toe, right    Plan:  Patient was evaluated and treated and all questions answered.  Right 3rd Ingrown nail -Gently debrided and avulsed with nail nipper. -Rx doxycycline for scant redness and for prophylaxis. High risk for infection -Dressed with neosporin and band-aid  -Advised not to pick at her nails or calluses to prevent wounds and infection.   Return in about 3 weeks (around 07/24/2020) for Wound Care, Right.

## 2020-07-05 ENCOUNTER — Other Ambulatory Visit: Payer: Self-pay

## 2020-07-13 ENCOUNTER — Encounter: Payer: Self-pay | Admitting: Pharmacist

## 2020-07-13 DIAGNOSIS — C20 Malignant neoplasm of rectum: Secondary | ICD-10-CM

## 2020-07-24 ENCOUNTER — Ambulatory Visit (INDEPENDENT_AMBULATORY_CARE_PROVIDER_SITE_OTHER): Payer: 59 | Admitting: Podiatry

## 2020-07-24 DIAGNOSIS — Z5329 Procedure and treatment not carried out because of patient's decision for other reasons: Secondary | ICD-10-CM

## 2020-07-24 NOTE — Progress Notes (Signed)
No show for appt. 

## 2020-07-25 ENCOUNTER — Inpatient Hospital Stay: Payer: Medicare Other

## 2020-07-25 ENCOUNTER — Inpatient Hospital Stay: Payer: 59

## 2020-07-25 ENCOUNTER — Other Ambulatory Visit: Payer: Self-pay | Admitting: Hematology and Oncology

## 2020-07-25 LAB — BASIC METABOLIC PANEL
BUN: 21 (ref 4–21)
CO2: 25 — AB (ref 13–22)
Chloride: 98 — AB (ref 99–108)
Creatinine: 0.9 (ref 0.5–1.1)
Glucose: 410
Potassium: 4 (ref 3.4–5.3)
Sodium: 134 — AB (ref 137–147)

## 2020-07-25 LAB — CBC: RBC: 4.4 (ref 3.87–5.11)

## 2020-07-25 LAB — HEPATIC FUNCTION PANEL
ALT: 19 (ref 7–35)
AST: 23 (ref 13–35)
Alkaline Phosphatase: 71 (ref 25–125)
Bilirubin, Total: 0.3

## 2020-07-25 LAB — CBC AND DIFFERENTIAL
HCT: 35 — AB (ref 36–46)
Hemoglobin: 11.5 — AB (ref 12.0–16.0)
Neutrophils Absolute: 5120
Platelets: 320 (ref 150–399)
WBC: 6.1

## 2020-07-25 LAB — COMPREHENSIVE METABOLIC PANEL
Albumin: 3.9 (ref 3.5–5.0)
Calcium: 9.4 (ref 8.7–10.7)

## 2020-07-26 ENCOUNTER — Other Ambulatory Visit: Payer: Self-pay

## 2020-07-26 ENCOUNTER — Inpatient Hospital Stay: Payer: 59 | Attending: Oncology

## 2020-07-26 VITALS — BP 167/72 | HR 96 | Temp 98.9°F | Resp 16 | Wt 171.6 lb

## 2020-07-26 DIAGNOSIS — Z5111 Encounter for antineoplastic chemotherapy: Secondary | ICD-10-CM | POA: Insufficient documentation

## 2020-07-26 DIAGNOSIS — C20 Malignant neoplasm of rectum: Secondary | ICD-10-CM | POA: Diagnosis present

## 2020-07-26 MED ORDER — HEPARIN SOD (PORK) LOCK FLUSH 100 UNIT/ML IV SOLN
500.0000 [IU] | Freq: Once | INTRAVENOUS | Status: DC | PRN
  Filled 2020-07-26: qty 5

## 2020-07-26 MED ORDER — DEXTROSE 5 % IV SOLN
Freq: Once | INTRAVENOUS | Status: AC
  Filled 2020-07-26: qty 250

## 2020-07-26 MED ORDER — SODIUM CHLORIDE 0.9 % IV SOLN
2400.0000 mg/m2 | INTRAVENOUS | Status: DC
  Administered 2020-07-26: 4450 mg via INTRAVENOUS
  Filled 2020-07-26: qty 89

## 2020-07-26 MED ORDER — PALONOSETRON HCL INJECTION 0.25 MG/5ML
INTRAVENOUS | Status: AC
  Filled 2020-07-26: qty 5

## 2020-07-26 MED ORDER — PALONOSETRON HCL INJECTION 0.25 MG/5ML
0.2500 mg | Freq: Once | INTRAVENOUS | Status: AC
  Administered 2020-07-26: 0.25 mg via INTRAVENOUS

## 2020-07-26 MED ORDER — OXALIPLATIN CHEMO INJECTION 100 MG/20ML
81.0000 mg/m2 | Freq: Once | INTRAVENOUS | Status: AC
  Administered 2020-07-26: 150 mg via INTRAVENOUS
  Filled 2020-07-26: qty 20

## 2020-07-26 MED ORDER — SODIUM CHLORIDE 0.9 % IV SOLN
10.0000 mg | Freq: Once | INTRAVENOUS | Status: AC
  Administered 2020-07-26: 10 mg via INTRAVENOUS
  Filled 2020-07-26 (×2): qty 1

## 2020-07-26 MED ORDER — LEUCOVORIN CALCIUM INJECTION 350 MG
400.0000 mg/m2 | Freq: Once | INTRAVENOUS | Status: AC
  Administered 2020-07-26: 744 mg via INTRAVENOUS
  Filled 2020-07-26: qty 37.2

## 2020-07-26 MED ORDER — FLUOROURACIL CHEMO INJECTION 2.5 GM/50ML
400.0000 mg/m2 | Freq: Once | INTRAVENOUS | Status: AC
  Administered 2020-07-26: 750 mg via INTRAVENOUS
  Filled 2020-07-26: qty 15

## 2020-07-26 MED ORDER — SODIUM CHLORIDE 0.9% FLUSH
10.0000 mL | INTRAVENOUS | Status: DC | PRN
  Filled 2020-07-26: qty 10

## 2020-07-26 NOTE — Progress Notes (Signed)
Pt stable at time of discharge. 

## 2020-07-26 NOTE — Patient Instructions (Signed)
Leucovorin injection What is this medicine? LEUCOVORIN (loo koe VOR in) is used to prevent or treat the harmful effects of some medicines. This medicine is used to treat anemia caused by a low amount of folic acid in the body. It is also used with 5-fluorouracil (5-FU) to treat colon cancer. This medicine may be used for other purposes; ask your health care provider or pharmacist if you have questions. What should I tell my health care provider before I take this medicine? They need to know if you have any of these conditions:  anemia from low levels of vitamin B-12 in the blood  an unusual or allergic reaction to leucovorin, folic acid, other medicines, foods, dyes, or preservatives  pregnant or trying to get pregnant  breast-feeding How should I use this medicine? This medicine is for injection into a muscle or into a vein. It is given by a health care professional in a hospital or clinic setting. Talk to your pediatrician regarding the use of this medicine in children. Special care may be needed. Overdosage: If you think you have taken too much of this medicine contact a poison control center or emergency room at once. NOTE: This medicine is only for you. Do not share this medicine with others. What if I miss a dose? This does not apply. What may interact with this medicine?  capecitabine  fluorouracil  phenobarbital  phenytoin  primidone  trimethoprim-sulfamethoxazole This list may not describe all possible interactions. Give your health care provider a list of all the medicines, herbs, non-prescription drugs, or dietary supplements you use. Also tell them if you smoke, drink alcohol, or use illegal drugs. Some items may interact with your medicine. What should I watch for while using this medicine? Your condition will be monitored carefully while you are receiving this medicine. This medicine may increase the side effects of 5-fluorouracil, 5-FU. Tell your doctor or health  care professional if you have diarrhea or mouth sores that do not get better or that get worse. What side effects may I notice from receiving this medicine? Side effects that you should report to your doctor or health care professional as soon as possible:  allergic reactions like skin rash, itching or hives, swelling of the face, lips, or tongue  breathing problems  fever, infection  mouth sores  unusual bleeding or bruising  unusually weak or tired Side effects that usually do not require medical attention (report to your doctor or health care professional if they continue or are bothersome):  constipation or diarrhea  loss of appetite  nausea, vomiting This list may not describe all possible side effects. Call your doctor for medical advice about side effects. You may report side effects to FDA at 1-800-FDA-1088. Where should I keep my medicine? This drug is given in a hospital or clinic and will not be stored at home. NOTE: This sheet is a summary. It may not cover all possible information. If you have questions about this medicine, talk to your doctor, pharmacist, or health care provider.  2020 Elsevier/Gold Standard (2008-04-05 16:50:29) Oxaliplatin Injection What is this medicine? OXALIPLATIN (ox AL i PLA tin) is a chemotherapy drug. It targets fast dividing cells, like cancer cells, and causes these cells to die. This medicine is used to treat cancers of the colon and rectum, and many other cancers. This medicine may be used for other purposes; ask your health care provider or pharmacist if you have questions. COMMON BRAND NAME(S): Eloxatin What should I tell my health  care provider before I take this medicine? They need to know if you have any of these conditions:  heart disease  history of irregular heartbeat  liver disease  low blood counts, like white cells, platelets, or red blood cells  lung or breathing disease, like asthma  take medicines that treat or  prevent blood clots  tingling of the fingers or toes, or other nerve disorder  an unusual or allergic reaction to oxaliplatin, other chemotherapy, other medicines, foods, dyes, or preservatives  pregnant or trying to get pregnant  breast-feeding How should I use this medicine? This drug is given as an infusion into a vein. It is administered in a hospital or clinic by a specially trained health care professional. Talk to your pediatrician regarding the use of this medicine in children. Special care may be needed. Overdosage: If you think you have taken too much of this medicine contact a poison control center or emergency room at once. NOTE: This medicine is only for you. Do not share this medicine with others. What if I miss a dose? It is important not to miss a dose. Call your doctor or health care professional if you are unable to keep an appointment. What may interact with this medicine? Do not take this medicine with any of the following medications:  cisapride  dronedarone  pimozide  thioridazine This medicine may also interact with the following medications:  aspirin and aspirin-like medicines  certain medicines that treat or prevent blood clots like warfarin, apixaban, dabigatran, and rivaroxaban  cisplatin  cyclosporine  diuretics  medicines for infection like acyclovir, adefovir, amphotericin B, bacitracin, cidofovir, foscarnet, ganciclovir, gentamicin, pentamidine, vancomycin  NSAIDs, medicines for pain and inflammation, like ibuprofen or naproxen  other medicines that prolong the QT interval (an abnormal heart rhythm)  pamidronate  zoledronic acid This list may not describe all possible interactions. Give your health care provider a list of all the medicines, herbs, non-prescription drugs, or dietary supplements you use. Also tell them if you smoke, drink alcohol, or use illegal drugs. Some items may interact with your medicine. What should I watch for  while using this medicine? Your condition will be monitored carefully while you are receiving this medicine. You may need blood work done while you are taking this medicine. This medicine may make you feel generally unwell. This is not uncommon as chemotherapy can affect healthy cells as well as cancer cells. Report any side effects. Continue your course of treatment even though you feel ill unless your healthcare professional tells you to stop. This medicine can make you more sensitive to cold. Do not drink cold drinks or use ice. Cover exposed skin before coming in contact with cold temperatures or cold objects. When out in cold weather wear warm clothing and cover your mouth and nose to warm the air that goes into your lungs. Tell your doctor if you get sensitive to the cold. Do not become pregnant while taking this medicine or for 9 months after stopping it. Women should inform their health care professional if they wish to become pregnant or think they might be pregnant. Men should not father a child while taking this medicine and for 6 months after stopping it. There is potential for serious side effects to an unborn child. Talk to your health care professional for more information. Do not breast-feed a child while taking this medicine or for 3 months after stopping it. This medicine has caused ovarian failure in some women. This medicine may make it  more difficult to get pregnant. Talk to your health care professional if you are concerned about your fertility. This medicine has caused decreased sperm counts in some men. This may make it more difficult to father a child. Talk to your health care professional if you are concerned about your fertility. This medicine may increase your risk of getting an infection. Call your health care professional for advice if you get a fever, chills, or sore throat, or other symptoms of a cold or flu. Do not treat yourself. Try to avoid being around people who are  sick. Avoid taking medicines that contain aspirin, acetaminophen, ibuprofen, naproxen, or ketoprofen unless instructed by your health care professional. These medicines may hide a fever. Be careful brushing or flossing your teeth or using a toothpick because you may get an infection or bleed more easily. If you have any dental work done, tell your dentist you are receiving this medicine. What side effects may I notice from receiving this medicine? Side effects that you should report to your doctor or health care professional as soon as possible:  allergic reactions like skin rash, itching or hives, swelling of the face, lips, or tongue  breathing problems  cough  low blood counts - this medicine may decrease the number of white blood cells, red blood cells, and platelets. You may be at increased risk for infections and bleeding  nausea, vomiting  pain, redness, or irritation at site where injected  pain, tingling, numbness in the hands or feet  signs and symptoms of bleeding such as bloody or black, tarry stools; red or dark brown urine; spitting up blood or brown material that looks like coffee grounds; red spots on the skin; unusual bruising or bleeding from the eyes, gums, or nose  signs and symptoms of a dangerous change in heartbeat or heart rhythm like chest pain; dizziness; fast, irregular heartbeat; palpitations; feeling faint or lightheaded; falls  signs and symptoms of infection like fever; chills; cough; sore throat; pain or trouble passing urine  signs and symptoms of liver injury like dark yellow or brown urine; general ill feeling or flu-like symptoms; light-colored stools; loss of appetite; nausea; right upper belly pain; unusually weak or tired; yellowing of the eyes or skin  signs and symptoms of low red blood cells or anemia such as unusually weak or tired; feeling faint or lightheaded; falls  signs and symptoms of muscle injury like dark urine; trouble passing urine  or change in the amount of urine; unusually weak or tired; muscle pain; back pain Side effects that usually do not require medical attention (report to your doctor or health care professional if they continue or are bothersome):  changes in taste  diarrhea  gas  hair loss  loss of appetite  mouth sores This list may not describe all possible side effects. Call your doctor for medical advice about side effects. You may report side effects to FDA at 1-800-FDA-1088. Where should I keep my medicine? This drug is given in a hospital or clinic and will not be stored at home. NOTE: This sheet is a summary. It may not cover all possible information. If you have questions about this medicine, talk to your doctor, pharmacist, or health care provider.  2020 Elsevier/Gold Standard (2019-02-17 12:20:35) Fluorouracil, 5-FU injection What is this medicine? FLUOROURACIL, 5-FU (flure oh YOOR a sil) is a chemotherapy drug. It slows the growth of cancer cells. This medicine is used to treat many types of cancer like breast cancer, colon or  rectal cancer, pancreatic cancer, and stomach cancer. This medicine may be used for other purposes; ask your health care provider or pharmacist if you have questions. COMMON BRAND NAME(S): Adrucil What should I tell my health care provider before I take this medicine? They need to know if you have any of these conditions:  blood disorders  dihydropyrimidine dehydrogenase (DPD) deficiency  infection (especially a virus infection such as chickenpox, cold sores, or herpes)  kidney disease  liver disease  malnourished, poor nutrition  recent or ongoing radiation therapy  an unusual or allergic reaction to fluorouracil, other chemotherapy, other medicines, foods, dyes, or preservatives  pregnant or trying to get pregnant  breast-feeding How should I use this medicine? This drug is given as an infusion or injection into a vein. It is administered in a  hospital or clinic by a specially trained health care professional. Talk to your pediatrician regarding the use of this medicine in children. Special care may be needed. Overdosage: If you think you have taken too much of this medicine contact a poison control center or emergency room at once. NOTE: This medicine is only for you. Do not share this medicine with others. What if I miss a dose? It is important not to miss your dose. Call your doctor or health care professional if you are unable to keep an appointment. What may interact with this medicine?  allopurinol  cimetidine  dapsone  digoxin  hydroxyurea  leucovorin  levamisole  medicines for seizures like ethotoin, fosphenytoin, phenytoin  medicines to increase blood counts like filgrastim, pegfilgrastim, sargramostim  medicines that treat or prevent blood clots like warfarin, enoxaparin, and dalteparin  methotrexate  metronidazole  pyrimethamine  some other chemotherapy drugs like busulfan, cisplatin, estramustine, vinblastine  trimethoprim  trimetrexate  vaccines Talk to your doctor or health care professional before taking any of these medicines:  acetaminophen  aspirin  ibuprofen  ketoprofen  naproxen This list may not describe all possible interactions. Give your health care provider a list of all the medicines, herbs, non-prescription drugs, or dietary supplements you use. Also tell them if you smoke, drink alcohol, or use illegal drugs. Some items may interact with your medicine. What should I watch for while using this medicine? Visit your doctor for checks on your progress. This drug may make you feel generally unwell. This is not uncommon, as chemotherapy can affect healthy cells as well as cancer cells. Report any side effects. Continue your course of treatment even though you feel ill unless your doctor tells you to stop. In some cases, you may be given additional medicines to help with side  effects. Follow all directions for their use. Call your doctor or health care professional for advice if you get a fever, chills or sore throat, or other symptoms of a cold or flu. Do not treat yourself. This drug decreases your body's ability to fight infections. Try to avoid being around people who are sick. This medicine may increase your risk to bruise or bleed. Call your doctor or health care professional if you notice any unusual bleeding. Be careful brushing and flossing your teeth or using a toothpick because you may get an infection or bleed more easily. If you have any dental work done, tell your dentist you are receiving this medicine. Avoid taking products that contain aspirin, acetaminophen, ibuprofen, naproxen, or ketoprofen unless instructed by your doctor. These medicines may hide a fever. Do not become pregnant while taking this medicine. Women should inform their doctor if  they wish to become pregnant or think they might be pregnant. There is a potential for serious side effects to an unborn child. Talk to your health care professional or pharmacist for more information. Do not breast-feed an infant while taking this medicine. Men should inform their doctor if they wish to father a child. This medicine may lower sperm counts. Do not treat diarrhea with over the counter products. Contact your doctor if you have diarrhea that lasts more than 2 days or if it is severe and watery. This medicine can make you more sensitive to the sun. Keep out of the sun. If you cannot avoid being in the sun, wear protective clothing and use sunscreen. Do not use sun lamps or tanning beds/booths. What side effects may I notice from receiving this medicine? Side effects that you should report to your doctor or health care professional as soon as possible:  allergic reactions like skin rash, itching or hives, swelling of the face, lips, or tongue  low blood counts - this medicine may decrease the number of  white blood cells, red blood cells and platelets. You may be at increased risk for infections and bleeding.  signs of infection - fever or chills, cough, sore throat, pain or difficulty passing urine  signs of decreased platelets or bleeding - bruising, pinpoint red spots on the skin, black, tarry stools, blood in the urine  signs of decreased red blood cells - unusually weak or tired, fainting spells, lightheadedness  breathing problems  changes in vision  chest pain  mouth sores  nausea and vomiting  pain, swelling, redness at site where injected  pain, tingling, numbness in the hands or feet  redness, swelling, or sores on hands or feet  stomach pain  unusual bleeding Side effects that usually do not require medical attention (report to your doctor or health care professional if they continue or are bothersome):  changes in finger or toe nails  diarrhea  dry or itchy skin  hair loss  headache  loss of appetite  sensitivity of eyes to the light  stomach upset  unusually teary eyes This list may not describe all possible side effects. Call your doctor for medical advice about side effects. You may report side effects to FDA at 1-800-FDA-1088. Where should I keep my medicine? This drug is given in a hospital or clinic and will not be stored at home. NOTE: This sheet is a summary. It may not cover all possible information. If you have questions about this medicine, talk to your doctor, pharmacist, or health care provider.  2020 Elsevier/Gold Standard (2008-02-03 13:53:16)

## 2020-07-27 ENCOUNTER — Inpatient Hospital Stay: Payer: 59

## 2020-07-28 ENCOUNTER — Inpatient Hospital Stay: Payer: 59

## 2020-07-28 ENCOUNTER — Other Ambulatory Visit: Payer: Self-pay

## 2020-07-28 NOTE — Patient Instructions (Signed)
[facility] Discharge Instructions for Patients receiving Home Portable Chemo Pump    **The bag should finish at 46 hours, 96 hours or 7 days. For example, if your pump is scheduled for 46 hours and it was put on at 4pm, it should finish at 2 pm the day it is scheduled to come off regardless of your appointment time.    Estimated time to finish   __10/15/2021 at  1330_______________________ (Have your nurse fill in)     ** if the display on your pump reads "Low Volume" and it is beeping, take the batteries out of the pump and come to the cancer center for it to be taken off.   **If the pump alarms go off prior to the pump reading "Low Volume" then call the [phone] and someone can assist you.  **If the plunger comes out and the bag fluid is running out, please use your chemo spill kit to clean up the spill. Do not use paper towels or other house hold products.  ** If you have problems or questions regarding your pump, please call either the [phone] or the cancer center Monday-Friday 8:00am-4:30pm at [phone] and we will assist you.

## 2020-07-28 NOTE — Progress Notes (Signed)
Pt is stable at discharge. 

## 2020-08-08 ENCOUNTER — Other Ambulatory Visit: Payer: Self-pay | Admitting: Hematology and Oncology

## 2020-08-08 ENCOUNTER — Inpatient Hospital Stay: Payer: 59

## 2020-08-08 DIAGNOSIS — C20 Malignant neoplasm of rectum: Secondary | ICD-10-CM

## 2020-08-08 LAB — HEPATIC FUNCTION PANEL
ALT: 13 (ref 7–35)
AST: 21 (ref 13–35)
Alkaline Phosphatase: 57 (ref 25–125)
Bilirubin, Total: 0.3

## 2020-08-08 LAB — BASIC METABOLIC PANEL
BUN: 27 — AB (ref 4–21)
CO2: 23 — AB (ref 13–22)
Chloride: 103 (ref 99–108)
Creatinine: 1.1 (ref 0.5–1.1)
Glucose: 252
Potassium: 4.4 (ref 3.4–5.3)
Sodium: 134 — AB (ref 137–147)

## 2020-08-08 LAB — CBC AND DIFFERENTIAL
HCT: 32 — AB (ref 36–46)
Hemoglobin: 10.7 — AB (ref 12.0–16.0)
Neutrophils Absolute: 4.11
Platelets: 231 (ref 150–399)
WBC: 5.2

## 2020-08-08 LAB — CBC: RBC: 4.02 (ref 3.87–5.11)

## 2020-08-08 LAB — COMPREHENSIVE METABOLIC PANEL
Albumin: 3.7 (ref 3.5–5.0)
Calcium: 9.2 (ref 8.7–10.7)

## 2020-08-09 ENCOUNTER — Inpatient Hospital Stay: Payer: 59

## 2020-08-09 ENCOUNTER — Other Ambulatory Visit: Payer: Self-pay

## 2020-08-09 VITALS — BP 169/74 | HR 83 | Temp 98.3°F | Resp 16 | Ht 62.0 in | Wt 175.2 lb

## 2020-08-09 DIAGNOSIS — Z5111 Encounter for antineoplastic chemotherapy: Secondary | ICD-10-CM | POA: Diagnosis not present

## 2020-08-09 DIAGNOSIS — C20 Malignant neoplasm of rectum: Secondary | ICD-10-CM

## 2020-08-09 MED ORDER — DEXTROSE 5 % IV SOLN
Freq: Once | INTRAVENOUS | Status: AC
  Filled 2020-08-09: qty 250

## 2020-08-09 MED ORDER — SODIUM CHLORIDE 0.9 % IV SOLN
10.0000 mg | Freq: Once | INTRAVENOUS | Status: AC
  Administered 2020-08-09: 10 mg via INTRAVENOUS
  Filled 2020-08-09: qty 1
  Filled 2020-08-09: qty 10

## 2020-08-09 MED ORDER — PALONOSETRON HCL INJECTION 0.25 MG/5ML
INTRAVENOUS | Status: AC
  Filled 2020-08-09: qty 5

## 2020-08-09 MED ORDER — ALTEPLASE 2 MG IJ SOLR
INTRAMUSCULAR | Status: AC
  Filled 2020-08-09: qty 2

## 2020-08-09 MED ORDER — ALTEPLASE 2 MG IJ SOLR
2.0000 mg | Freq: Once | INTRAMUSCULAR | Status: AC | PRN
  Administered 2020-08-09: 2 mg
  Filled 2020-08-09: qty 2

## 2020-08-09 MED ORDER — SODIUM CHLORIDE 0.9% FLUSH
10.0000 mL | INTRAVENOUS | Status: DC | PRN
  Filled 2020-08-09: qty 10

## 2020-08-09 MED ORDER — FLUOROURACIL CHEMO INJECTION 2.5 GM/50ML
400.0000 mg/m2 | Freq: Once | INTRAVENOUS | Status: AC
  Administered 2020-08-09: 750 mg via INTRAVENOUS
  Filled 2020-08-09 (×2): qty 15

## 2020-08-09 MED ORDER — LEUCOVORIN CALCIUM INJECTION 350 MG
400.0000 mg/m2 | Freq: Once | INTRAVENOUS | Status: AC
  Administered 2020-08-09: 744 mg via INTRAVENOUS
  Filled 2020-08-09 (×2): qty 37.2

## 2020-08-09 MED ORDER — PALONOSETRON HCL INJECTION 0.25 MG/5ML
0.2500 mg | Freq: Once | INTRAVENOUS | Status: AC
  Administered 2020-08-09: 0.25 mg via INTRAVENOUS

## 2020-08-09 MED ORDER — SODIUM CHLORIDE 0.9 % IV SOLN
2400.0000 mg/m2 | INTRAVENOUS | Status: AC
  Administered 2020-08-09: 4450 mg via INTRAVENOUS
  Filled 2020-08-09 (×2): qty 89

## 2020-08-09 MED ORDER — HEPARIN SOD (PORK) LOCK FLUSH 100 UNIT/ML IV SOLN
500.0000 [IU] | Freq: Once | INTRAVENOUS | Status: DC | PRN
  Filled 2020-08-09: qty 5

## 2020-08-09 MED ORDER — ALTEPLASE 2 MG IJ SOLR
2.0000 mg | Freq: Once | INTRAMUSCULAR | Status: AC
  Administered 2020-08-09: 2 mg
  Filled 2020-08-09: qty 2

## 2020-08-09 MED ORDER — OXALIPLATIN CHEMO INJECTION 100 MG/20ML
81.0000 mg/m2 | Freq: Once | INTRAVENOUS | Status: AC
  Administered 2020-08-09: 150 mg via INTRAVENOUS
  Filled 2020-08-09: qty 30
  Filled 2020-08-09: qty 20

## 2020-08-09 NOTE — Patient Instructions (Signed)
[place] Discharge Instructions for Patients Receiving Chemotherapy  Today you received the following chemotherapy agents Oxaliplatin, Leucovorin, 5FU To help prevent nausea and vomiting after your treatment, we encourage you to take your nausea medication.  If you develop nausea and vomiting that is not controlled by your nausea medication, call the clinic.   BELOW ARE SYMPTOMS THAT SHOULD BE REPORTED IMMEDIATELY:  *FEVER GREATER THAN 100.5 F  *CHILLS WITH OR WITHOUT FEVER  NAUSEA AND VOMITING THAT IS NOT CONTROLLED WITH YOUR NAUSEA MEDICATION  *UNUSUAL SHORTNESS OF BREATH  *UNUSUAL BRUISING OR BLEEDING  TENDERNESS IN MOUTH AND THROAT WITH OR WITHOUT PRESENCE OF ULCERS  *URINARY PROBLEMS  *BOWEL PROBLEMS  UNUSUAL RASH Items with * indicate a potential emergency and should be followed up as soon as possible.  Feel free to call the clinic should you have any questions or concerns at The clinic phone number is [phone].  Please show the [place] at check-in to the Emergency Department and triage nurse.

## 2020-08-09 NOTE — Progress Notes (Deleted)
Gloria Hodge  [address] [place],  [place]  [address] [phone]  Clinic Day:  08/09/2020  Referring physician: Bonnita Nasuti, MD   CHIEF COMPLAINT:  CC: ***The patient is a 54 year old woman who clinical stage IIB (T3 N0 M0) rectal cancer, status post neoadjuvant chemoradiation, which consisted of infusional 5-fluorouracil.  The patient also recently underwent a low anterior resection of her rectal mass in early September 2021.  She comes in today to go over the pathologic results from this surgery, as well as to discuss the next phase of her definitive therapy.  Overall, the patient has recuperated very well from her surgery.  She is back to having normal bowel movements.  She denies having any abdominal pain, hematochezia, or other GI symptoms which concern her for persistent disease.  HISTORY OF PRESENT ILLNESS:  Gloria Hodge is a 54 y.o. female with a history of *** who is referred in consultation with {REFERRING PHYSICIAN} for assessment and management.   PAST MEDICAL HISTORY:   Past Medical History:  Diagnosis Date  . Arthritis    hands  . Calf pain 01/2020  . Depression   . Diabetes ([facility])   . Diabetic retinopathy associated with controlled type 2 diabetes mellitus ([facility])   . Dizziness   . Fatigue    resolved taking meds for sleep  . Frequent headaches   . IBS (irritable bowel syndrome)    resolved  . Malignant neoplasm of rectum ([facility])   . 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 Muscle pain   . Swelling     PAST SURGICAL HISTORY:   Past Surgical History:  Procedure Laterality Date  . ABDOMINAL HYSTERECTOMY    . BLADDER SURGERY    . BREAST SURGERY     reduction  . carpel tunnel surgery     right hand  . EYE SURGERY Left 11/29/2019   for diabetic retinopathy  . FLEXIBLE SIGMOIDOSCOPY  06/21/2020   Procedure: FLEXIBLE SIGMOIDOSCOPY;  Surgeon: Leighton Ruff, MD;  Location: [facility];  Service: General;;  . HERNIA REPAIR     . KNEE SURGERY     left  . TOE AMPUTATION Bilateral 2018-2020   great and 3rd toes  . XI ROBOTIC ASSISTED LOWER ANTERIOR RESECTION N/A 06/21/2020   Procedure: XI ROBOTIC ASSISTED LOWER ANTERIOR RESECTION;  Surgeon: Leighton Ruff, MD;  Location: [facility];  Service: General;  Laterality: N/A;    CURRENT MEDICATIONS:   Current Outpatient Medications  Medication Sig Dispense Refill  . acetaminophen (TYLENOL) 500 MG tablet Take 1,000 mg by mouth every 6 (six) hours as needed for headache.    . Continuous Blood Gluc Receiver (FREESTYLE LIBRE 14 DAY READER) DEVI     . Continuous Blood Gluc Sensor (FREESTYLE LIBRE 14 DAY SENSOR) MISC     . cyclobenzaprine (FLEXERIL) 10 MG tablet Take 10 mg by mouth daily.     . Delafloxacin Meglumine (BAXDELA) 450 MG TABS Take 1 tablet by mouth 2 (two) times daily. (Patient not taking: Reported on 05/31/2020) 14 tablet 0  . diphenhydrAMINE (BENADRYL) 25 MG tablet Take 75 mg by mouth daily as needed for allergies.    Marland Kitchen doxycycline (VIBRA-TABS) 100 MG tablet Take 1 tablet (100 mg total) by mouth 2 (two) times daily. 14 tablet 0  . fenofibrate (TRICOR) 145 MG tablet Take 145 mg by mouth daily.     . furosemide (LASIX) 20 MG tablet Take 40 mg by mouth daily.     Marland Kitchen  HUMALOG KWIKPEN 200 UNIT/ML SOPN Inject 20-30 Units into the skin 3 (three) times daily with meals.     . hydrocortisone cream 1 % Apply 1 application topically daily as needed for itching.    Marland Kitchen LINZESS 145 MCG CAPS capsule Take 145 mcg by mouth daily as needed for constipation.    . metFORMIN (GLUCOPHAGE) 1000 MG tablet Take 1,000 mg by mouth 2 (two) times daily.     Marland Kitchen neomycin-polymyxin-hydrocortisone (CORTISPORIN) OTIC solution Apply 2-3 drops to the ingrown toenail site twice daily. Cover with band-aid. (Patient not taking: Reported on 05/31/2020) 10 mL 0  . ondansetron (ZOFRAN) 8 MG tablet Take 8 mg by mouth 2 (two) times daily as needed for nausea or vomiting.     Marland Kitchen oxyCODONE (ROXICODONE) 15 MG immediate  release tablet Take 15 mg by mouth 3 (three) times daily.     Vladimir Faster Glycol-Propyl Glycol (SYSTANE OP) Place 1 drop into both eyes 2 (two) times daily.    . Probiotic CHEW Chew 2 tablets by mouth daily as needed (immune support).    . prochlorperazine (COMPAZINE) 10 MG tablet Take 10 mg by mouth every 6 (six) hours as needed for nausea or vomiting.    . rosuvastatin (CRESTOR) 20 MG tablet Take 20 mg by mouth daily.    . sertraline (ZOLOFT) 100 MG tablet Take 100 mg by mouth daily.    . TRESIBA FLEXTOUCH 100 UNIT/ML SOPN FlexTouch Pen Inject 60 Units into the skin daily.     Marland Kitchen zolpidem (AMBIEN) 10 MG tablet Take 10 mg by mouth at bedtime.      Current Facility-Administered Medications  Medication Dose Route Frequency Provider Last Rate Last Admin  . dexamethasone (DECADRON) 10 mg in sodium chloride 0.9 % 50 mL IVPB  10 mg Intravenous Once Cheronda Erck A, MD      . dextrose 5 % solution   Intravenous Once Joao Mccurdy A, MD      . dextrose 5 % solution   Intravenous Once Nelle Sayed A, MD      . fluorouracil (ADRUCIL) 4,450 mg in sodium chloride 0.9 % 61 mL chemo infusion  2,400 mg/m2 (Treatment Plan Recorded) Intravenous 1 day or 1 dose Larenzo Caples A, MD      . fluorouracil (ADRUCIL) chemo injection 750 mg  400 mg/m2 (Treatment Plan Recorded) Intravenous Once Aleighna Wojtas A, MD      . heparin lock flush 100 unit/mL  500 Units Intracatheter Once PRN Aurorah Schlachter A, MD      . leucovorin 744 mg in dextrose 5 % 250 mL infusion  400 mg/m2 (Treatment Plan Recorded) Intravenous Once Katilin Raynes A, MD      . oxaliplatin (ELOXATIN) 150 mg in dextrose 5 % 500 mL chemo infusion  81 mg/m2 (Treatment Plan Recorded) Intravenous Once Ezme Duch A, MD      . palonosetron (ALOXI) injection 0.25 mg  0.25 mg Intravenous Once Akshaya Toepfer A, MD      . sodium chloride flush (NS) 0.9 % injection 10 mL  10 mL Intracatheter PRN Steed Kanaan A, MD        ALLERGIES:   Allergies   Allergen Reactions  . Bactrim [Sulfamethoxazole-Trimethoprim] Rash    FAMILY HISTORY:  No family history on file.  SOCIAL HISTORY:   reports that she has quit smoking. She has never used smokeless tobacco. She reports that she does not drink alcohol and does not use drugs.  REVIEW OF SYSTEMS:  Review of Systems -  Oncology   PHYSICAL EXAM:  Blood pressure (!) 169/74, pulse 83, temperature 98.3 F (36.8 C), temperature source Oral, resp. rate 16, height 5\' 2"  (1.575 m), weight 175 lb 4 oz (79.5 kg), SpO2 99 %. Wt Readings from Last 3 Encounters:  08/09/20 175 lb 4 oz (79.5 kg)  07/12/20 173 lb 14.4 oz (78.9 kg)  07/28/20 176 lb (79.8 kg)   Body mass index is 32.05 kg/m. Performance status (ECOG): {CHL ONC Q3448304 Physical Exam  LABS:   CBC Latest Ref Rng & Units 08/08/2020 07/25/2020 06/23/2020  WBC - 5.2 6.1 9.2  Hemoglobin 12.0 - 16.0 10.7(A) 11.5(A) 9.9(L)  Hematocrit 36 - 46 32(A) 35(A) 30.7(L)  Platelets 150 - 399 231 320 275   CMP Latest Ref Rng & Units 08/08/2020 07/25/2020 06/23/2020  Glucose 70 - 99 mg/dL - - 179(H)  BUN 4 - 21 27(A) 21 27(H)  Creatinine 0.5 - 1.1 1.1 0.9 1.49(H)  Sodium 137 - 147 134(A) 134(A) 136  Potassium 3.4 - 5.3 4.4 4.0 3.8  Chloride 99 - 108 103 98(A) 103  CO2 13 - 22 23(A) 25(A) 26  Calcium 8.7 - 10.7 9.2 9.4 8.6(L)  Alkaline Phos 25 - 125 57 71 -  AST 13 - 35 21 23 -  ALT 7 - 35 13 19 -     No results found for: CEA1 / No results found for: CEA1 No results found for: PSA1 No results found for: HEN277 No results found for: OEU235  No results found for: TOTALPROTELP, ALBUMINELP, A1GS, A2GS, BETS, BETA2SER, GAMS, MSPIKE, SPEI No results found for: TIBC, FERRITIN, IRONPCTSAT No results found for: LDH  STUDIES:  No results found.   ASSESSMENT & PLAN:   Patrick Sohm is a 54 y.o. female ***   The patient understands all the plans discussed today and is in agreement with them.  I do appreciate [name],  [name], MD for his new consult. so   Tyrah Broers Macarthur Critchley, MD

## 2020-08-09 NOTE — Progress Notes (Signed)
Pt stable at time of discharge. 

## 2020-08-09 NOTE — Progress Notes (Deleted)
  [facility]  [address] [place],  [place]  [address] [phone]  Clinic Day:  08/09/2020  Referring physician: Bonnita Nasuti, MD   CHIEF COMPLAINT:  CC: ***  HISTORY OF PRESENT ILLNESS:  Gloria Hodge is a 54 y.o. female with a history of *** who I follow for   PHYSICAL EXAM:  Blood pressure (!) 169/74, pulse 83, temperature 98.3 F (36.8 C), temperature source Oral, resp. rate 16, height 5\' 2"  (1.575 m), weight 175 lb 4 oz (79.5 kg), SpO2 99 %. Wt Readings from Last 3 Encounters:  08/09/20 175 lb 4 oz (79.5 kg)  07/12/20 173 lb 14.4 oz (78.9 kg)  07/28/20 176 lb (79.8 kg)   Body mass index is 32.05 kg/m. Performance status (ECOG): {CHL ONC Q3448304 Physical Exam  LABS:   CBC Latest Ref Rng & Units 08/08/2020 07/25/2020 06/23/2020  WBC - 5.2 6.1 9.2  Hemoglobin 12.0 - 16.0 10.7(A) 11.5(A) 9.9(L)  Hematocrit 36 - 46 32(A) 35(A) 30.7(L)  Platelets 150 - 399 231 320 275   CMP Latest Ref Rng & Units 08/08/2020 07/25/2020 06/23/2020  Glucose 70 - 99 mg/dL - - 179(H)  BUN 4 - 21 27(A) 21 27(H)  Creatinine 0.5 - 1.1 1.1 0.9 1.49(H)  Sodium 137 - 147 134(A) 134(A) 136  Potassium 3.4 - 5.3 4.4 4.0 3.8  Chloride 99 - 108 103 98(A) 103  CO2 13 - 22 23(A) 25(A) 26  Calcium 8.7 - 10.7 9.2 9.4 8.6(L)  Alkaline Phos 25 - 125 57 71 -  AST 13 - 35 21 23 -  ALT 7 - 35 13 19 -     No results found for: CEA1 / No results found for: CEA1 No results found for: PSA1 No results found for: BWI203 No results found for: TDH741  No results found for: TOTALPROTELP, ALBUMINELP, A1GS, A2GS, BETS, BETA2SER, GAMS, MSPIKE, SPEI No results found for: TIBC, FERRITIN, IRONPCTSAT No results found for: LDH  STUDIES:  No results found.    ASSESSMENT & PLAN:   Assessment/Plan:  Gloria Hodge is a 54 y.o. female ***   The patient understands all the plans discussed today and is in agreement with them.       Gloria Bradburn Macarthur Critchley, MD

## 2020-08-10 ENCOUNTER — Inpatient Hospital Stay: Payer: 59

## 2020-08-10 ENCOUNTER — Other Ambulatory Visit: Payer: Self-pay | Admitting: Oncology

## 2020-08-11 ENCOUNTER — Other Ambulatory Visit: Payer: Self-pay

## 2020-08-11 ENCOUNTER — Inpatient Hospital Stay: Payer: 59

## 2020-08-11 VITALS — BP 175/70 | HR 88 | Temp 98.3°F | Resp 18

## 2020-08-11 DIAGNOSIS — C20 Malignant neoplasm of rectum: Secondary | ICD-10-CM

## 2020-08-11 DIAGNOSIS — Z5111 Encounter for antineoplastic chemotherapy: Secondary | ICD-10-CM | POA: Diagnosis not present

## 2020-08-11 MED ORDER — SODIUM CHLORIDE 0.9% FLUSH
10.0000 mL | INTRAVENOUS | Status: DC | PRN
  Administered 2020-08-11: 10 mL
  Filled 2020-08-11: qty 10

## 2020-08-11 MED ORDER — HEPARIN SOD (PORK) LOCK FLUSH 100 UNIT/ML IV SOLN
500.0000 [IU] | Freq: Once | INTRAVENOUS | Status: AC | PRN
  Administered 2020-08-11: 500 [IU]
  Filled 2020-08-11: qty 5

## 2020-08-11 NOTE — Patient Instructions (Signed)
Fluorouracil, 5-FU injection What is this medicine? FLUOROURACIL, 5-FU (flure oh YOOR a sil) is a chemotherapy drug. It slows the growth of cancer cells. This medicine is used to treat many types of cancer like breast cancer, colon or rectal cancer, pancreatic cancer, and stomach cancer. This medicine may be used for other purposes; ask your health care provider or pharmacist if you have questions. COMMON BRAND NAME(S): Adrucil What should I tell my health care provider before I take this medicine? They need to know if you have any of these conditions:  blood disorders  dihydropyrimidine dehydrogenase (DPD) deficiency  infection (especially a virus infection such as chickenpox, cold sores, or herpes)  kidney disease  liver disease  malnourished, poor nutrition  recent or ongoing radiation therapy  an unusual or allergic reaction to fluorouracil, other chemotherapy, other medicines, foods, dyes, or preservatives  pregnant or trying to get pregnant  breast-feeding How should I use this medicine? This drug is given as an infusion or injection into a vein. It is administered in a hospital or clinic by a specially trained health care professional. Talk to your pediatrician regarding the use of this medicine in children. Special care may be needed. Overdosage: If you think you have taken too much of this medicine contact a poison control center or emergency room at once. NOTE: This medicine is only for you. Do not share this medicine with others. What if I miss a dose? It is important not to miss your dose. Call your doctor or health care professional if you are unable to keep an appointment. What may interact with this medicine?  allopurinol  cimetidine  dapsone  digoxin  hydroxyurea  leucovorin  levamisole  medicines for seizures like ethotoin, fosphenytoin, phenytoin  medicines to increase blood counts like filgrastim, pegfilgrastim, sargramostim  medicines that  treat or prevent blood clots like warfarin, enoxaparin, and dalteparin  methotrexate  metronidazole  pyrimethamine  some other chemotherapy drugs like busulfan, cisplatin, estramustine, vinblastine  trimethoprim  trimetrexate  vaccines Talk to your doctor or health care professional before taking any of these medicines:  acetaminophen  aspirin  ibuprofen  ketoprofen  naproxen This list may not describe all possible interactions. Give your health care provider a list of all the medicines, herbs, non-prescription drugs, or dietary supplements you use. Also tell them if you smoke, drink alcohol, or use illegal drugs. Some items may interact with your medicine. What should I watch for while using this medicine? Visit your doctor for checks on your progress. This drug may make you feel generally unwell. This is not uncommon, as chemotherapy can affect healthy cells as well as cancer cells. Report any side effects. Continue your course of treatment even though you feel ill unless your doctor tells you to stop. In some cases, you may be given additional medicines to help with side effects. Follow all directions for their use. Call your doctor or health care professional for advice if you get a fever, chills or sore throat, or other symptoms of a cold or flu. Do not treat yourself. This drug decreases your body's ability to fight infections. Try to avoid being around people who are sick. This medicine may increase your risk to bruise or bleed. Call your doctor or health care professional if you notice any unusual bleeding. Be careful brushing and flossing your teeth or using a toothpick because you may get an infection or bleed more easily. If you have any dental work done, tell your dentist you are   receiving this medicine. Avoid taking products that contain aspirin, acetaminophen, ibuprofen, naproxen, or ketoprofen unless instructed by your doctor. These medicines may hide a fever. Do not  become pregnant while taking this medicine. Women should inform their doctor if they wish to become pregnant or think they might be pregnant. There is a potential for serious side effects to an unborn child. Talk to your health care professional or pharmacist for more information. Do not breast-feed an infant while taking this medicine. Men should inform their doctor if they wish to father a child. This medicine may lower sperm counts. Do not treat diarrhea with over the counter products. Contact your doctor if you have diarrhea that lasts more than 2 days or if it is severe and watery. This medicine can make you more sensitive to the sun. Keep out of the sun. If you cannot avoid being in the sun, wear protective clothing and use sunscreen. Do not use sun lamps or tanning beds/booths. What side effects may I notice from receiving this medicine? Side effects that you should report to your doctor or health care professional as soon as possible:  allergic reactions like skin rash, itching or hives, swelling of the face, lips, or tongue  low blood counts - this medicine may decrease the number of white blood cells, red blood cells and platelets. You may be at increased risk for infections and bleeding.  signs of infection - fever or chills, cough, sore throat, pain or difficulty passing urine  signs of decreased platelets or bleeding - bruising, pinpoint red spots on the skin, black, tarry stools, blood in the urine  signs of decreased red blood cells - unusually weak or tired, fainting spells, lightheadedness  breathing problems  changes in vision  chest pain  mouth sores  nausea and vomiting  pain, swelling, redness at site where injected  pain, tingling, numbness in the hands or feet  redness, swelling, or sores on hands or feet  stomach pain  unusual bleeding Side effects that usually do not require medical attention (report to your doctor or health care professional if they  continue or are bothersome):  changes in finger or toe nails  diarrhea  dry or itchy skin  hair loss  headache  loss of appetite  sensitivity of eyes to the light  stomach upset  unusually teary eyes This list may not describe all possible side effects. Call your doctor for medical advice about side effects. You may report side effects to FDA at 1-800-FDA-1088. Where should I keep my medicine? This drug is given in a hospital or clinic and will not be stored at home. NOTE: This sheet is a summary. It may not cover all possible information. If you have questions about this medicine, talk to your doctor, pharmacist, or health care provider.  2020 Elsevier/Gold Standard (2008-02-03 13:53:16)  

## 2020-08-11 NOTE — Progress Notes (Signed)
PT STABLE AT TIME OF DISCHARGE 

## 2020-08-14 ENCOUNTER — Ambulatory Visit (INDEPENDENT_AMBULATORY_CARE_PROVIDER_SITE_OTHER): Payer: 59 | Admitting: Podiatry

## 2020-08-14 ENCOUNTER — Other Ambulatory Visit: Payer: Self-pay

## 2020-08-14 ENCOUNTER — Encounter: Payer: Self-pay | Admitting: Podiatry

## 2020-08-14 DIAGNOSIS — M79676 Pain in unspecified toe(s): Secondary | ICD-10-CM | POA: Diagnosis not present

## 2020-08-14 DIAGNOSIS — L600 Ingrowing nail: Secondary | ICD-10-CM

## 2020-08-14 NOTE — Progress Notes (Signed)
  Subjective:  Patient ID: Gloria Hodge, female    DOB: [birth year],  MRN: [id number]  Chief Complaint  Patient presents with  . Foot Ulcer    the 3rd toe on the right is about the same and there is not any draining that i can see     54 y.o. female presents for wound care. Hx confirmed with patient.  Objective:  Physical Exam: Right 3rd toe xerotic with small healing wound bed but no warmth erythema signs of infection. No other open wounds Assessment:   1. Ingrown nail   2. Pain around toenail    Plan:  Patient was evaluated and treated and all questions answered.  Right 3rd Ingrown nail -Healing slowly. No signs of infection today -Minimal debridement today -Dressed with neosporin and band-aid. Patient to do so daily.   No follow-ups on file.

## 2020-08-15 NOTE — Progress Notes (Signed)
PT STABLE AT TIME OF DISCHARGE 

## 2020-08-17 ENCOUNTER — Other Ambulatory Visit: Payer: Self-pay | Admitting: Hematology and Oncology

## 2020-08-17 MED ORDER — PROCHLORPERAZINE MALEATE 10 MG PO TABS: 10.0000 mg | ORAL_TABLET | Freq: Four times a day (QID) | ORAL | 2 refills | Status: DC | PRN

## 2020-08-20 ENCOUNTER — Other Ambulatory Visit: Payer: Self-pay | Admitting: Oncology

## 2020-08-20 DIAGNOSIS — C20 Malignant neoplasm of rectum: Secondary | ICD-10-CM

## 2020-08-20 NOTE — Progress Notes (Signed)
[facility]  [address] [place],  [place]  [address] [phone]  Clinic Day:  08/21/2020  Referring physician: Bonnita Nasuti, MD   HISTORY OF PRESENT ILLNESS:  The patient is a 54 y.o. female with stage IIB (T3 N0 M0) rectal cancer, who comes in today to be evaluated before heading into her 3rd cycle of adjuvant FOLFOX chemotherapy.  The patient claims to have tolerated her 2nd cycle of adjuvant FOLFOX chemotherapy fairly well.  She denies having any GI symptoms which concern her for persistent disease.  She is status post neoadjuvant chemoradiation, which consisted of infusional 5-fluorouracil.  The patient also underwent a low anterior resection of her rectal cancer in early September 2021.      PHYSICAL EXAM:  Blood pressure (!) 193/86, pulse 80, temperature 98.4 F (36.9 C), temperature source Oral, resp. rate 16, height 5\' 2"  (1.575 m), weight 175 lb 12.8 oz (79.7 kg). Wt Readings from Last 3 Encounters:  08/21/20 175 lb 12.8 oz (79.7 kg)  08/15/20 172 lb 8 oz (78.2 kg)  08/09/20 175 lb 4 oz (79.5 kg)   Body mass index is 32.15 kg/m. Performance status (ECOG): 1 Physical Exam Constitutional:      Appearance: Normal appearance. She is not ill-appearing.  HENT:     Mouth/Throat:     Mouth: Mucous membranes are moist.     Pharynx: Oropharynx is clear. No oropharyngeal exudate or posterior oropharyngeal erythema.  Cardiovascular:     Rate and Rhythm: Normal rate and regular rhythm.     Heart sounds: No murmur heard.  No friction rub. No gallop.   Pulmonary:     Effort: Pulmonary effort is normal. No respiratory distress.     Breath sounds: Normal breath sounds. No wheezing, rhonchi or rales.  Abdominal:     General: Bowel sounds are normal. There is no distension.     Palpations: Abdomen is soft. There is no mass.     Tenderness: There is no abdominal tenderness.  Musculoskeletal:        General: No swelling.     Right lower leg:  No edema.     Left lower leg: No edema.  Lymphadenopathy:     Cervical: No cervical adenopathy.     Upper Body:     Right upper body: No supraclavicular or axillary adenopathy.     Left upper body: No supraclavicular or axillary adenopathy.     Lower Body: No right inguinal adenopathy. No left inguinal adenopathy.  Skin:    General: Skin is warm.     Coloration: Skin is not jaundiced.     Findings: No lesion or rash.  Neurological:     General: No focal deficit present.     Mental Status: She is alert and oriented to person, place, and time. Mental status is at baseline.     Cranial Nerves: Cranial nerves are intact.  Psychiatric:        Mood and Affect: Mood normal.        Behavior: Behavior normal.        Thought Content: Thought content normal.     LABS:         ASSESSMENT & PLAN:   Assessment/Plan:  A 54 y.o. female with stage IIB rectal cancer.  She will proceed with her 3rd cycle of adjuvant FOLFOXX chemotherapy today.  Clinically, the patient is doing well.  I will see this patient back in 2 weeks before she heads into her  4th cycle of adjuvant FOLFOX chemotherapy.  The patient understands all the plans discussed today and is in agreement with them.      Jericho Alcorn Macarthur Critchley, MD

## 2020-08-21 ENCOUNTER — Telehealth: Payer: Self-pay | Admitting: Oncology

## 2020-08-21 ENCOUNTER — Other Ambulatory Visit: Payer: Self-pay | Admitting: Oncology

## 2020-08-21 ENCOUNTER — Inpatient Hospital Stay (INDEPENDENT_AMBULATORY_CARE_PROVIDER_SITE_OTHER): Payer: 59 | Admitting: Oncology

## 2020-08-21 ENCOUNTER — Encounter: Payer: Self-pay | Admitting: Oncology

## 2020-08-21 ENCOUNTER — Inpatient Hospital Stay: Payer: 59 | Attending: Oncology

## 2020-08-21 ENCOUNTER — Ambulatory Visit: Payer: Medicare Other | Admitting: Oncology

## 2020-08-21 ENCOUNTER — Other Ambulatory Visit: Payer: Medicare Other

## 2020-08-21 ENCOUNTER — Other Ambulatory Visit: Payer: Self-pay

## 2020-08-21 VITALS — BP 193/86 | HR 80 | Temp 98.4°F | Resp 16 | Ht 62.0 in | Wt 175.8 lb

## 2020-08-21 DIAGNOSIS — C20 Malignant neoplasm of rectum: Secondary | ICD-10-CM | POA: Insufficient documentation

## 2020-08-21 DIAGNOSIS — E860 Dehydration: Secondary | ICD-10-CM | POA: Insufficient documentation

## 2020-08-21 DIAGNOSIS — Z5111 Encounter for antineoplastic chemotherapy: Secondary | ICD-10-CM | POA: Insufficient documentation

## 2020-08-21 LAB — CBC: RBC: 4.12 (ref 3.87–5.11)

## 2020-08-21 LAB — BASIC METABOLIC PANEL
BUN: 20 (ref 4–21)
CO2: 26 — AB (ref 13–22)
Chloride: 98 — AB (ref 99–108)
Creatinine: 1 (ref 0.5–1.1)
Glucose: 238
Potassium: 3.6 (ref 3.4–5.3)
Sodium: 136 — AB (ref 137–147)

## 2020-08-21 LAB — HEPATIC FUNCTION PANEL
ALT: 15 (ref 7–35)
AST: 22 (ref 13–35)
Alkaline Phosphatase: 46 (ref 25–125)
Bilirubin, Total: 0.3

## 2020-08-21 LAB — CBC AND DIFFERENTIAL
HCT: 33 — AB (ref 36–46)
Hemoglobin: 10.7 — AB (ref 12.0–16.0)
Neutrophils Absolute: 4.66
Platelets: 216 (ref 150–399)
WBC: 5.9

## 2020-08-21 LAB — COMPREHENSIVE METABOLIC PANEL
Albumin: 3.9 (ref 3.5–5.0)
Calcium: 9.2 (ref 8.7–10.7)

## 2020-08-21 LAB — MAGNESIUM: Magnesium: 1.8 mg/dL (ref 1.7–2.4)

## 2020-08-21 NOTE — Telephone Encounter (Signed)
Per 11/8 LOS.Patient given next appt

## 2020-08-22 ENCOUNTER — Ambulatory Visit: Payer: Medicare Other

## 2020-08-23 ENCOUNTER — Other Ambulatory Visit: Payer: Self-pay

## 2020-08-23 ENCOUNTER — Inpatient Hospital Stay: Payer: 59

## 2020-08-23 ENCOUNTER — Ambulatory Visit: Payer: Self-pay | Admitting: Oncology

## 2020-08-23 ENCOUNTER — Ambulatory Visit: Payer: Medicare Other

## 2020-08-23 VITALS — BP 156/73 | HR 80 | Temp 98.6°F | Resp 18 | Ht 62.0 in | Wt 181.2 lb

## 2020-08-23 DIAGNOSIS — C20 Malignant neoplasm of rectum: Secondary | ICD-10-CM

## 2020-08-23 MED ORDER — DEXTROSE 5 % IV SOLN
Freq: Once | INTRAVENOUS | Status: AC
Start: 2020-08-23 — End: 2020-08-23
  Filled 2020-08-23: qty 250

## 2020-08-23 MED ORDER — SODIUM CHLORIDE 0.9 % IV SOLN
2400.0000 mg/m2 | INTRAVENOUS | Status: DC
  Administered 2020-08-23: 4450 mg via INTRAVENOUS
  Filled 2020-08-23: qty 89

## 2020-08-23 MED ORDER — HEPARIN SOD (PORK) LOCK FLUSH 100 UNIT/ML IV SOLN
500.0000 [IU] | Freq: Once | INTRAVENOUS | Status: DC | PRN
  Filled 2020-08-23: qty 5

## 2020-08-23 MED ORDER — FLUOROURACIL CHEMO INJECTION 2.5 GM/50ML
400.0000 mg/m2 | Freq: Once | INTRAVENOUS | Status: AC
  Administered 2020-08-23: 750 mg via INTRAVENOUS
  Filled 2020-08-23: qty 15

## 2020-08-23 MED ORDER — PALONOSETRON HCL INJECTION 0.25 MG/5ML
INTRAVENOUS | Status: AC
  Filled 2020-08-23: qty 5

## 2020-08-23 MED ORDER — OXALIPLATIN CHEMO INJECTION 100 MG/20ML
81.0000 mg/m2 | Freq: Once | INTRAVENOUS | Status: AC
  Administered 2020-08-23: 150 mg via INTRAVENOUS
  Filled 2020-08-23: qty 20

## 2020-08-23 MED ORDER — SODIUM CHLORIDE 0.9 % IV SOLN
10.0000 mg | Freq: Once | INTRAVENOUS | Status: AC
  Administered 2020-08-23: 10 mg via INTRAVENOUS
  Filled 2020-08-23: qty 10

## 2020-08-23 MED ORDER — PALONOSETRON HCL INJECTION 0.25 MG/5ML
0.2500 mg | Freq: Once | INTRAVENOUS | Status: AC
  Administered 2020-08-23: 0.25 mg via INTRAVENOUS

## 2020-08-23 MED ORDER — LEUCOVORIN CALCIUM INJECTION 350 MG
400.0000 mg/m2 | Freq: Once | INTRAVENOUS | Status: AC
  Administered 2020-08-23: 744 mg via INTRAVENOUS
  Filled 2020-08-23: qty 37.2

## 2020-08-23 MED ORDER — DEXTROSE 5 % IV SOLN
Freq: Once | INTRAVENOUS | Status: AC
  Filled 2020-08-23: qty 250

## 2020-08-23 NOTE — Progress Notes (Signed)
PT STABLE AT TIME OF DISCHARGE 

## 2020-08-23 NOTE — Patient Instructions (Signed)
[facility] Discharge Instructions for Patients Receiving Chemotherapy  Today you received the following chemotherapy agents Oxaliplatin, Leucovorin, Fluoouracil  To help prevent nausea and vomiting after your treatment, we encourage you to take your nausea medication as directed by doctor.   If you develop nausea and vomiting that is not controlled by your nausea medication, call the clinic.   BELOW ARE SYMPTOMS THAT SHOULD BE REPORTED IMMEDIATELY:  *FEVER GREATER THAN 100.5 F  *CHILLS WITH OR WITHOUT FEVER  NAUSEA AND VOMITING THAT IS NOT CONTROLLED WITH YOUR NAUSEA MEDICATION  *UNUSUAL SHORTNESS OF BREATH  *UNUSUAL BRUISING OR BLEEDING  TENDERNESS IN MOUTH AND THROAT WITH OR WITHOUT PRESENCE OF ULCERS  *URINARY PROBLEMS  *BOWEL PROBLEMS  UNUSUAL RASH Items with * indicate a potential emergency and should be followed up as soon as possible.  Feel free to call the clinic should you have any questions or concerns at The clinic phone number is [phone].  Please show the [facility] at check-in to the Emergency Department and triage nurse.

## 2020-08-25 ENCOUNTER — Inpatient Hospital Stay: Payer: 59

## 2020-08-25 ENCOUNTER — Other Ambulatory Visit: Payer: Self-pay

## 2020-08-25 VITALS — BP 153/59 | HR 96 | Temp 98.0°F | Resp 18 | Ht 62.0 in | Wt 175.8 lb

## 2020-08-25 DIAGNOSIS — C20 Malignant neoplasm of rectum: Secondary | ICD-10-CM

## 2020-08-25 MED ORDER — HEPARIN SOD (PORK) LOCK FLUSH 100 UNIT/ML IV SOLN
500.0000 [IU] | Freq: Once | INTRAVENOUS | Status: AC | PRN
  Administered 2020-08-25: 500 [IU]
  Filled 2020-08-25: qty 5

## 2020-08-25 MED ORDER — SODIUM CHLORIDE 0.9% FLUSH
10.0000 mL | INTRAVENOUS | Status: DC | PRN
  Administered 2020-08-25: 10 mL
  Filled 2020-08-25: qty 10

## 2020-08-25 NOTE — Progress Notes (Signed)
PT STABLE AT TIME OF DISCHARGE 

## 2020-08-25 NOTE — Patient Instructions (Signed)
[facility] Discharge Instructions for Patients Receiving Chemotherapy  Today you received the following chemotherapy agents 5FU  To help prevent nausea and vomiting after your treatment, we encourage you to take your nausea medication.   If you develop nausea and vomiting that is not controlled by your nausea medication, call the clinic.   BELOW ARE SYMPTOMS THAT SHOULD BE REPORTED IMMEDIATELY:  *FEVER GREATER THAN 100.5 F  *CHILLS WITH OR WITHOUT FEVER  NAUSEA AND VOMITING THAT IS NOT CONTROLLED WITH YOUR NAUSEA MEDICATION  *UNUSUAL SHORTNESS OF BREATH  *UNUSUAL BRUISING OR BLEEDING  TENDERNESS IN MOUTH AND THROAT WITH OR WITHOUT PRESENCE OF ULCERS  *URINARY PROBLEMS  *BOWEL PROBLEMS  UNUSUAL RASH Items with * indicate a potential emergency and should be followed up as soon as possible.  Feel free to call the clinic should you have any questions or concerns at The clinic phone number is [phone].  PT INSTRUCTED TO CALL HER PRIMARY PHYSICIAN AND HAVE HIM TO HELP HER REGULATE HER BLOOD SUGARS, WHILE SHE IS ON CHEMOTHERAPY. PT VERBALIZED UNDERSTANDING. MELISSA PARSONS,NP ASSESSED THE  PT WHILE SHE WAS HERE TODAY.  Please show the [facility] at check-in to the Emergency Department and triage nurse.

## 2020-08-30 ENCOUNTER — Other Ambulatory Visit: Payer: Self-pay | Admitting: Pharmacist

## 2020-08-31 NOTE — Progress Notes (Signed)
[facility]  [address] [place],  [place]  [address] [phone]  Clinic Day:  09/01/2020  Referring physician: Bonnita Nasuti, MD   HISTORY OF PRESENT ILLNESS:  The patient is a 54 y.o. female with stage IIB (T3 N0 M0) rectal cancer, who comes in today to be evaluated before heading into her 4th cycle of adjuvant FOLFOX chemotherapy.  The patient claims to have tolerated her 3rd cycle of adjuvant FOLFOX chemotherapy fairly well.  She denies having any GI symptoms which concern her for persistent disease.  She is status post neoadjuvant chemoradiation, which consisted of infusional 5-fluorouracil.  The patient also underwent a low anterior resection of her rectal cancer in early September 2021.  The only problem she has run into recently is syncopal/presyncopal episodes.  She has these episode when she is standing up or while having a bowel movement.     PHYSICAL EXAM:  Temperature 99.4 F (37.4 C), temperature source Oral, resp. rate 16, height 5\' 2"  (1.575 m), weight 175 lb (79.4 kg), SpO2 100 %. Wt Readings from Last 3 Encounters:  09/01/20 175 lb (79.4 kg)  08/25/20 175 lb 12 oz (79.7 kg)  08/23/20 181 lb 4 oz (82.2 kg)   Body mass index is 32.01 kg/m. Performance status (ECOG): 1 Physical Exam Constitutional:      Appearance: Normal appearance. She is not ill-appearing.  HENT:     Mouth/Throat:     Mouth: Mucous membranes are moist.     Pharynx: Oropharynx is clear. No oropharyngeal exudate or posterior oropharyngeal erythema.  Cardiovascular:     Rate and Rhythm: Normal rate and regular rhythm.     Heart sounds: No murmur heard.  No friction rub. No gallop.   Pulmonary:     Effort: Pulmonary effort is normal. No respiratory distress.     Breath sounds: Normal breath sounds. No wheezing, rhonchi or rales.  Abdominal:     General: Bowel sounds are normal. There is no distension.     Palpations: Abdomen is soft. There is no mass.      Tenderness: There is no abdominal tenderness.  Musculoskeletal:        General: No swelling.     Right lower leg: No edema.     Left lower leg: No edema.  Lymphadenopathy:     Cervical: No cervical adenopathy.     Upper Body:     Right upper body: No supraclavicular or axillary adenopathy.     Left upper body: No supraclavicular or axillary adenopathy.     Lower Body: No right inguinal adenopathy. No left inguinal adenopathy.  Skin:    General: Skin is warm.     Coloration: Skin is not jaundiced.     Findings: No lesion or rash.  Neurological:     General: No focal deficit present.     Mental Status: She is alert and oriented to person, place, and time. Mental status is at baseline.     Cranial Nerves: Cranial nerves are intact.  Psychiatric:        Mood and Affect: Mood normal.        Behavior: Behavior normal.        Thought Content: Thought content normal.     LABS:     ASSESSMENT & PLAN:   Assessment/Plan:  A 54 y.o. female with stage IIB rectal cancer.  She will proceed with her 4th cycle of adjuvant FOLFOX chemotherapy today. Based upon some of her symptoms,  I believe the patient has a component of dehydration.  I will give her 1.5 L of normal saline with her chemotherapy to ensure she is more euvolemic.  Overall, the patient is doing well.  I will see this patient back in 2 weeks before she heads into her 5th cycle of adjuvant FOLFOX chemotherapy.  The patient understands all the plans discussed today and is in agreement with them.      Mahati Vajda Macarthur Critchley, MD

## 2020-09-01 ENCOUNTER — Encounter: Payer: Self-pay | Admitting: Oncology

## 2020-09-01 ENCOUNTER — Inpatient Hospital Stay: Payer: 59

## 2020-09-01 ENCOUNTER — Inpatient Hospital Stay (INDEPENDENT_AMBULATORY_CARE_PROVIDER_SITE_OTHER): Payer: 59 | Admitting: Oncology

## 2020-09-01 ENCOUNTER — Other Ambulatory Visit: Payer: Self-pay

## 2020-09-01 VITALS — Temp 99.4°F | Resp 16 | Ht 62.0 in | Wt 175.0 lb

## 2020-09-01 DIAGNOSIS — C20 Malignant neoplasm of rectum: Secondary | ICD-10-CM

## 2020-09-01 MED FILL — Dexamethasone Sodium Phosphate Inj 100 MG/10ML: INTRAMUSCULAR | Qty: 1 | Status: AC

## 2020-09-03 ENCOUNTER — Other Ambulatory Visit: Payer: Self-pay | Admitting: Oncology

## 2020-09-04 ENCOUNTER — Other Ambulatory Visit: Payer: Self-pay

## 2020-09-04 ENCOUNTER — Inpatient Hospital Stay: Payer: 59

## 2020-09-04 VITALS — BP 175/77 | HR 98 | Temp 98.9°F | Resp 18 | Ht 62.0 in | Wt 177.8 lb

## 2020-09-04 DIAGNOSIS — C20 Malignant neoplasm of rectum: Secondary | ICD-10-CM | POA: Diagnosis not present

## 2020-09-04 LAB — BASIC METABOLIC PANEL
BUN: 21 (ref 4–21)
CO2: 26 — AB (ref 13–22)
Chloride: 98 — AB (ref 99–108)
Creatinine: 1 (ref 0.5–1.1)
Glucose: 175
Potassium: 3.6 (ref 3.4–5.3)
Sodium: 137 (ref 137–147)

## 2020-09-04 LAB — COMPREHENSIVE METABOLIC PANEL
Albumin: 4.4 (ref 3.5–5.0)
Calcium: 9.7 (ref 8.7–10.7)

## 2020-09-04 LAB — HEPATIC FUNCTION PANEL
ALT: 18 (ref 7–35)
AST: 22 (ref 13–35)
Alkaline Phosphatase: 72 (ref 25–125)
Bilirubin, Total: 0.4

## 2020-09-04 LAB — CBC AND DIFFERENTIAL
HCT: 35 — AB (ref 36–46)
Hemoglobin: 11.7 — AB (ref 12.0–16.0)
Neutrophils Absolute: 3.93
Platelets: 236 (ref 150–399)
WBC: 5.7

## 2020-09-04 LAB — CBC: RBC: 4.46 (ref 3.87–5.11)

## 2020-09-04 MED ORDER — SODIUM CHLORIDE 0.9% FLUSH
10.0000 mL | INTRAVENOUS | Status: DC | PRN
  Administered 2020-09-04: 10 mL
  Filled 2020-09-04: qty 10

## 2020-09-04 MED ORDER — DEXTROSE 5 % IV SOLN
Freq: Once | INTRAVENOUS | Status: AC
  Filled 2020-09-04: qty 250

## 2020-09-04 MED ORDER — PALONOSETRON HCL INJECTION 0.25 MG/5ML
INTRAVENOUS | Status: AC
  Filled 2020-09-04: qty 5

## 2020-09-04 MED ORDER — OXALIPLATIN CHEMO INJECTION 100 MG/20ML
81.0000 mg/m2 | Freq: Once | INTRAVENOUS | Status: AC
  Administered 2020-09-04: 150 mg via INTRAVENOUS
  Filled 2020-09-04: qty 10

## 2020-09-04 MED ORDER — ALTEPLASE 2 MG IJ SOLR
2.0000 mg | Freq: Once | INTRAMUSCULAR | Status: AC | PRN
  Administered 2020-09-04: 2 mg
  Filled 2020-09-04: qty 2

## 2020-09-04 MED ORDER — PALONOSETRON HCL INJECTION 0.25 MG/5ML
0.2500 mg | Freq: Once | INTRAVENOUS | Status: AC
  Administered 2020-09-04: 0.25 mg via INTRAVENOUS

## 2020-09-04 MED ORDER — LEUCOVORIN CALCIUM INJECTION 100 MG
50.0000 mg | Freq: Once | INTRAMUSCULAR | Status: AC
  Administered 2020-09-04: 50 mg via INTRAVENOUS
  Filled 2020-09-04: qty 2.5

## 2020-09-04 MED ORDER — SODIUM CHLORIDE 0.9 % IV SOLN
2400.0000 mg/m2 | INTRAVENOUS | Status: DC
  Administered 2020-09-04: 4450 mg via INTRAVENOUS
  Filled 2020-09-04: qty 89

## 2020-09-04 MED ORDER — ALTEPLASE 2 MG IJ SOLR
INTRAMUSCULAR | Status: AC
  Filled 2020-09-04: qty 2

## 2020-09-04 MED ORDER — FLUOROURACIL CHEMO INJECTION 2.5 GM/50ML
400.0000 mg/m2 | Freq: Once | INTRAVENOUS | Status: AC
  Administered 2020-09-04: 750 mg via INTRAVENOUS
  Filled 2020-09-04: qty 15

## 2020-09-04 MED ORDER — DEXTROSE 5 % IV SOLN
Freq: Once | INTRAVENOUS | Status: DC
  Filled 2020-09-04: qty 250

## 2020-09-04 MED ORDER — SODIUM CHLORIDE 0.9 % IV SOLN
Freq: Once | INTRAVENOUS | Status: AC
  Filled 2020-09-04: qty 250

## 2020-09-04 MED ORDER — HEPARIN SOD (PORK) LOCK FLUSH 100 UNIT/ML IV SOLN
500.0000 [IU] | Freq: Once | INTRAVENOUS | Status: DC | PRN
  Filled 2020-09-04: qty 5

## 2020-09-04 NOTE — Progress Notes (Signed)
PT STABLE AT TIME OF DISCHARGE 

## 2020-09-04 NOTE — Patient Instructions (Signed)
[facility] Discharge Instructions for Patients Receiving Chemotherapy  Today you received the following chemotherapy agents Fluorouracil, Leucovorin, Oxaliplatin  To help prevent nausea and vomiting after your treatment, we encourage you to take your nausea medication as directed.   If you develop nausea and vomiting that is not controlled by your nausea medication, call the clinic.   BELOW ARE SYMPTOMS THAT SHOULD BE REPORTED IMMEDIATELY:  *FEVER GREATER THAN 100.5 F  *CHILLS WITH OR WITHOUT FEVER  NAUSEA AND VOMITING THAT IS NOT CONTROLLED WITH YOUR NAUSEA MEDICATION  *UNUSUAL SHORTNESS OF BREATH  *UNUSUAL BRUISING OR BLEEDING  TENDERNESS IN MOUTH AND THROAT WITH OR WITHOUT PRESENCE OF ULCERS  *URINARY PROBLEMS  *BOWEL PROBLEMS  UNUSUAL RASH Items with * indicate a potential emergency and should be followed up as soon as possible.  Feel free to call the clinic should you have any questions or concerns at The clinic phone number is [phone].  Please show the CHEMO ALERT CARD at check-in to the Emergency Department and triage nurse.   

## 2020-09-05 ENCOUNTER — Ambulatory Visit: Payer: Medicare Other

## 2020-09-06 ENCOUNTER — Other Ambulatory Visit: Payer: Self-pay

## 2020-09-06 ENCOUNTER — Inpatient Hospital Stay: Payer: 59

## 2020-09-06 DIAGNOSIS — C20 Malignant neoplasm of rectum: Secondary | ICD-10-CM | POA: Diagnosis not present

## 2020-09-06 MED ORDER — HEPARIN SOD (PORK) LOCK FLUSH 100 UNIT/ML IV SOLN
500.0000 [IU] | Freq: Once | INTRAVENOUS | Status: AC | PRN
  Administered 2020-09-06: 500 [IU]
  Filled 2020-09-06: qty 5

## 2020-09-06 MED ORDER — SODIUM CHLORIDE 0.9% FLUSH
10.0000 mL | INTRAVENOUS | Status: DC | PRN
  Administered 2020-09-06: 10 mL
  Filled 2020-09-06: qty 10

## 2020-09-06 NOTE — Patient Instructions (Signed)
Fluorouracil, 5-FU injection What is this medicine? FLUOROURACIL, 5-FU (flure oh YOOR a sil) is a chemotherapy drug. It slows the growth of cancer cells. This medicine is used to treat many types of cancer like breast cancer, colon or rectal cancer, pancreatic cancer, and stomach cancer. This medicine may be used for other purposes; ask your health care provider or pharmacist if you have questions. COMMON BRAND NAME(S): Adrucil What should I tell my health care provider before I take this medicine? They need to know if you have any of these conditions:  blood disorders  dihydropyrimidine dehydrogenase (DPD) deficiency  infection (especially a virus infection such as chickenpox, cold sores, or herpes)  kidney disease  liver disease  malnourished, poor nutrition  recent or ongoing radiation therapy  an unusual or allergic reaction to fluorouracil, other chemotherapy, other medicines, foods, dyes, or preservatives  pregnant or trying to get pregnant  breast-feeding How should I use this medicine? This drug is given as an infusion or injection into a vein. It is administered in a hospital or clinic by a specially trained health care professional. Talk to your pediatrician regarding the use of this medicine in children. Special care may be needed. Overdosage: If you think you have taken too much of this medicine contact a poison control center or emergency room at once. NOTE: This medicine is only for you. Do not share this medicine with others. What if I miss a dose? It is important not to miss your dose. Call your doctor or health care professional if you are unable to keep an appointment. What may interact with this medicine?  allopurinol  cimetidine  dapsone  digoxin  hydroxyurea  leucovorin  levamisole  medicines for seizures like ethotoin, fosphenytoin, phenytoin  medicines to increase blood counts like filgrastim, pegfilgrastim, sargramostim  medicines that  treat or prevent blood clots like warfarin, enoxaparin, and dalteparin  methotrexate  metronidazole  pyrimethamine  some other chemotherapy drugs like busulfan, cisplatin, estramustine, vinblastine  trimethoprim  trimetrexate  vaccines Talk to your doctor or health care professional before taking any of these medicines:  acetaminophen  aspirin  ibuprofen  ketoprofen  naproxen This list may not describe all possible interactions. Give your health care provider a list of all the medicines, herbs, non-prescription drugs, or dietary supplements you use. Also tell them if you smoke, drink alcohol, or use illegal drugs. Some items may interact with your medicine. What should I watch for while using this medicine? Visit your doctor for checks on your progress. This drug may make you feel generally unwell. This is not uncommon, as chemotherapy can affect healthy cells as well as cancer cells. Report any side effects. Continue your course of treatment even though you feel ill unless your doctor tells you to stop. In some cases, you may be given additional medicines to help with side effects. Follow all directions for their use. Call your doctor or health care professional for advice if you get a fever, chills or sore throat, or other symptoms of a cold or flu. Do not treat yourself. This drug decreases your body's ability to fight infections. Try to avoid being around people who are sick. This medicine may increase your risk to bruise or bleed. Call your doctor or health care professional if you notice any unusual bleeding. Be careful brushing and flossing your teeth or using a toothpick because you may get an infection or bleed more easily. If you have any dental work done, tell your dentist you are   receiving this medicine. Avoid taking products that contain aspirin, acetaminophen, ibuprofen, naproxen, or ketoprofen unless instructed by your doctor. These medicines may hide a fever. Do not  become pregnant while taking this medicine. Women should inform their doctor if they wish to become pregnant or think they might be pregnant. There is a potential for serious side effects to an unborn child. Talk to your health care professional or pharmacist for more information. Do not breast-feed an infant while taking this medicine. Men should inform their doctor if they wish to father a child. This medicine may lower sperm counts. Do not treat diarrhea with over the counter products. Contact your doctor if you have diarrhea that lasts more than 2 days or if it is severe and watery. This medicine can make you more sensitive to the sun. Keep out of the sun. If you cannot avoid being in the sun, wear protective clothing and use sunscreen. Do not use sun lamps or tanning beds/booths. What side effects may I notice from receiving this medicine? Side effects that you should report to your doctor or health care professional as soon as possible:  allergic reactions like skin rash, itching or hives, swelling of the face, lips, or tongue  low blood counts - this medicine may decrease the number of white blood cells, red blood cells and platelets. You may be at increased risk for infections and bleeding.  signs of infection - fever or chills, cough, sore throat, pain or difficulty passing urine  signs of decreased platelets or bleeding - bruising, pinpoint red spots on the skin, black, tarry stools, blood in the urine  signs of decreased red blood cells - unusually weak or tired, fainting spells, lightheadedness  breathing problems  changes in vision  chest pain  mouth sores  nausea and vomiting  pain, swelling, redness at site where injected  pain, tingling, numbness in the hands or feet  redness, swelling, or sores on hands or feet  stomach pain  unusual bleeding Side effects that usually do not require medical attention (report to your doctor or health care professional if they  continue or are bothersome):  changes in finger or toe nails  diarrhea  dry or itchy skin  hair loss  headache  loss of appetite  sensitivity of eyes to the light  stomach upset  unusually teary eyes This list may not describe all possible side effects. Call your doctor for medical advice about side effects. You may report side effects to FDA at 1-800-FDA-1088. Where should I keep my medicine? This drug is given in a hospital or clinic and will not be stored at home. NOTE: This sheet is a summary. It may not cover all possible information. If you have questions about this medicine, talk to your doctor, pharmacist, or health care provider.  2020 Elsevier/Gold Standard (2008-02-03 13:53:16)  

## 2020-09-06 NOTE — Progress Notes (Signed)
PT STABLE AT TIME OF DISCHARGE 

## 2020-09-14 IMAGING — MR MR PELVIS W/O CM
6 of 8 series · 32 of 48 positions shown · non-contrast
Comparison: 01/20/2020 chest CT.

CLINICAL DATA: Rectal cancer diagnosed 01/05/2020.

EXAM:
MRI PELVIS WITHOUT CONTRAST
TECHNIQUE: Multiplanar multisequence MR imaging of the pelvis was performed. No
intravenous contrast was administered. Small amount of US gel was
administered per rectum to optimize tumor evaluation.

[Series 2: t2_tse_sag · sagittal · 3.0mm · 0.47mm/px · 5 of 36 slices shown (1 of 2)]
[im 1/36]
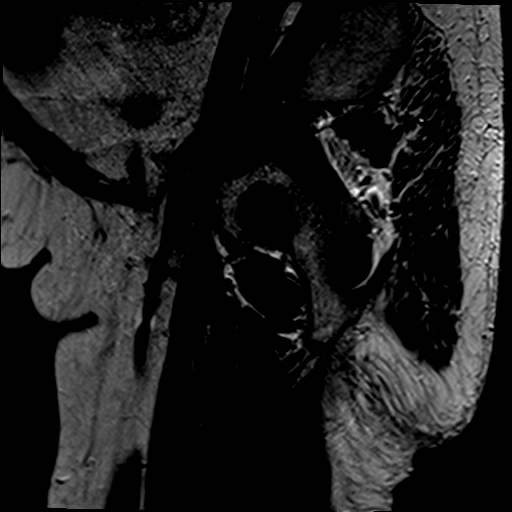
[im 9/36]
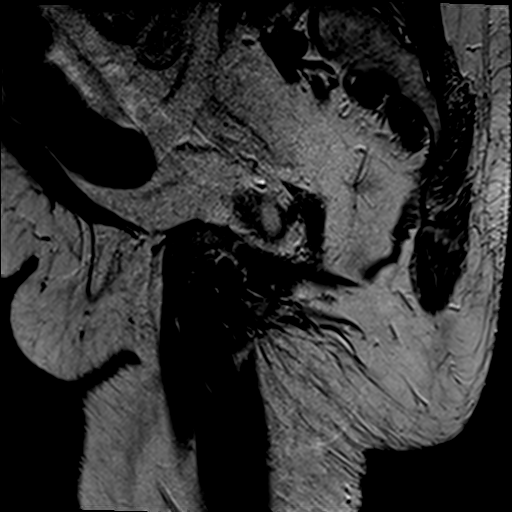
[im 18/36]
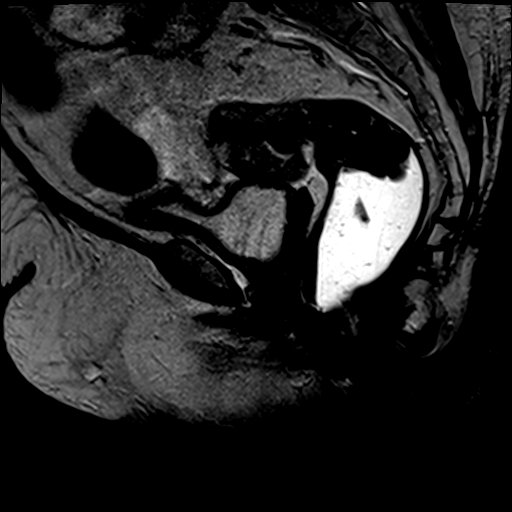
[im 27/36]
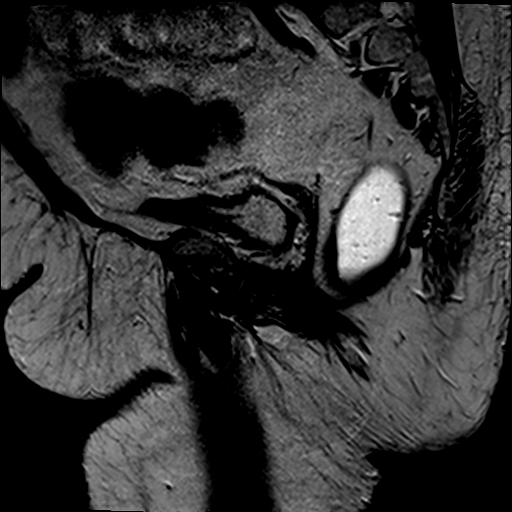
[im 36/36]
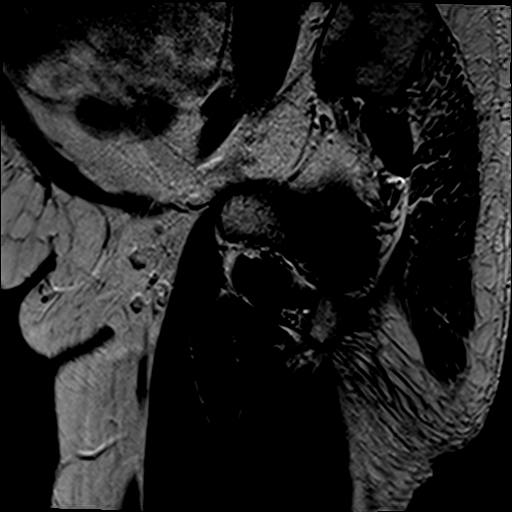

[Series 3: T2 fat-sat · axial · 5.0mm · 1.19mm/px · z∈[-34,+194]mm · 5 of 39 slices shown]
[im 1/39]
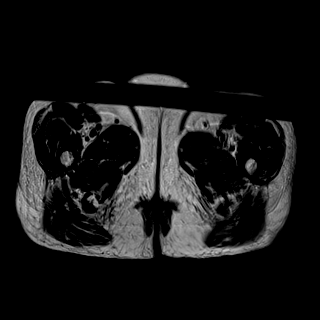
[im 10/39]
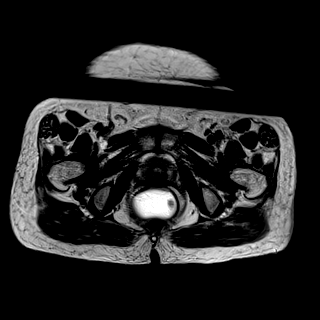
[im 20/39]
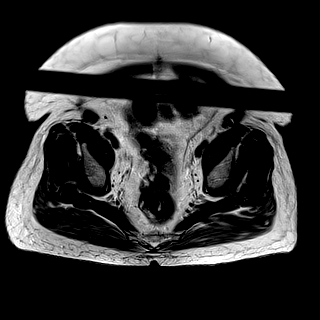
[im 29/39]
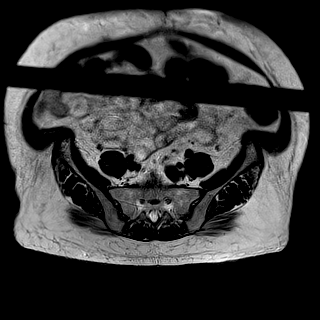
[im 39/39]
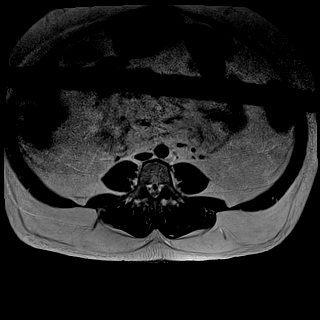

[Series 4: t2_tse_cor · coronal · 3.0mm · 0.35mm/px · 5 of 40 slices shown]
[im 1/40]
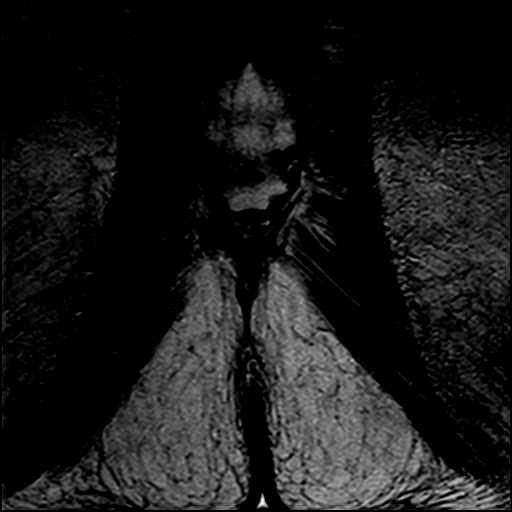
[im 10/40]
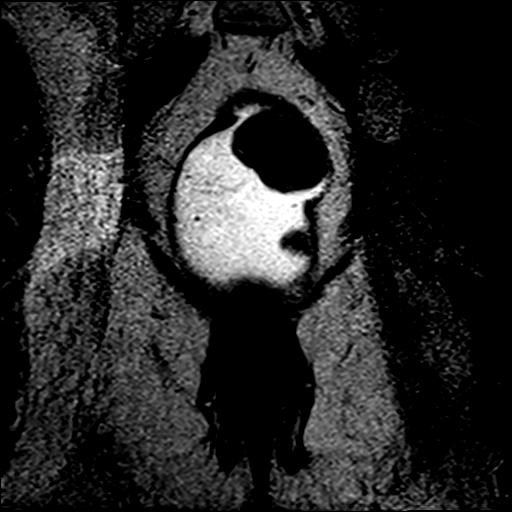
[im 20/40]
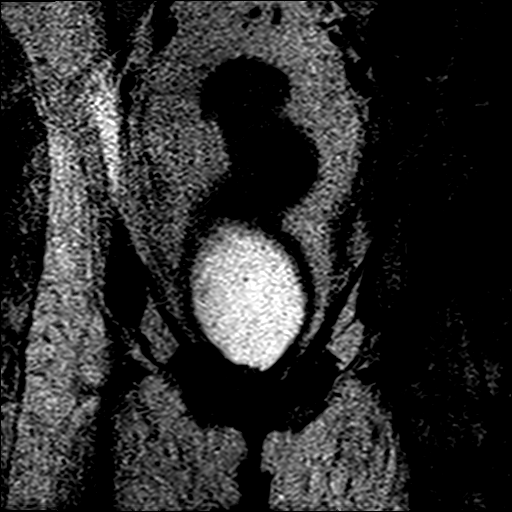
[im 30/40]
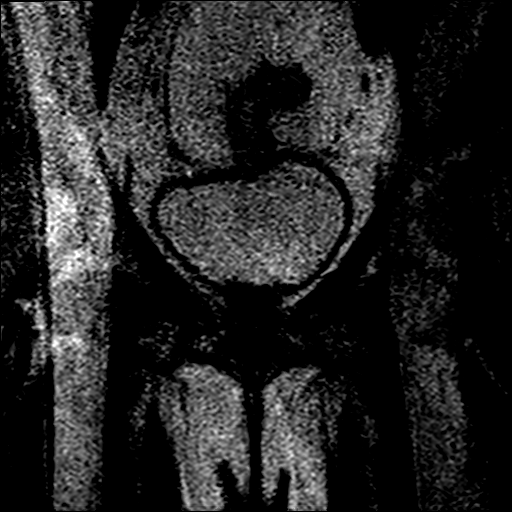
[im 40/40]
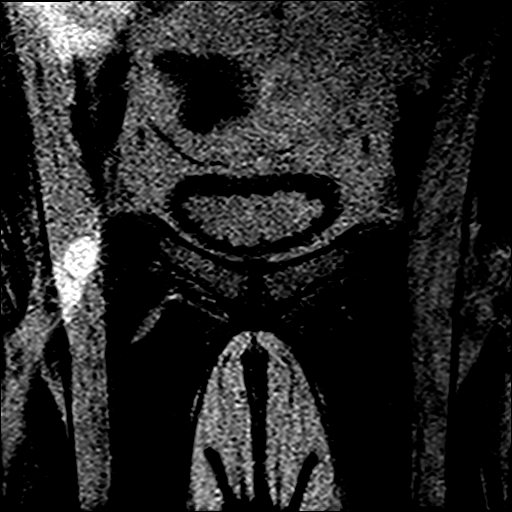

[Series 5: t2_tse_sag · sagittal · 3.0mm · 0.74mm/px · 5 of 37 slices shown (2 of 2)]
[im 1/37]
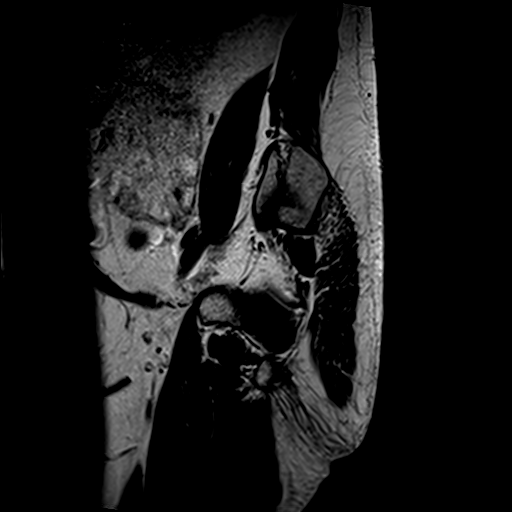
[im 10/37]
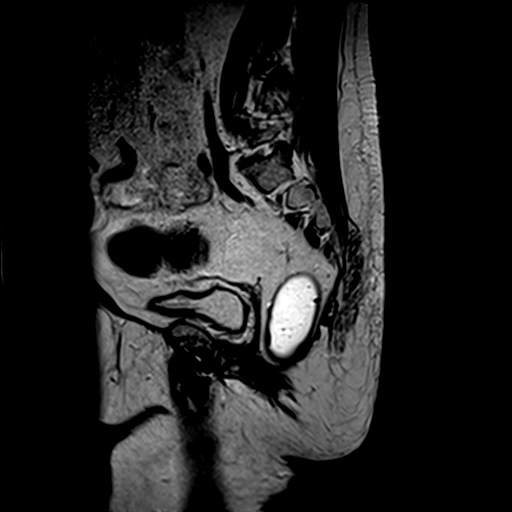
[im 19/37]
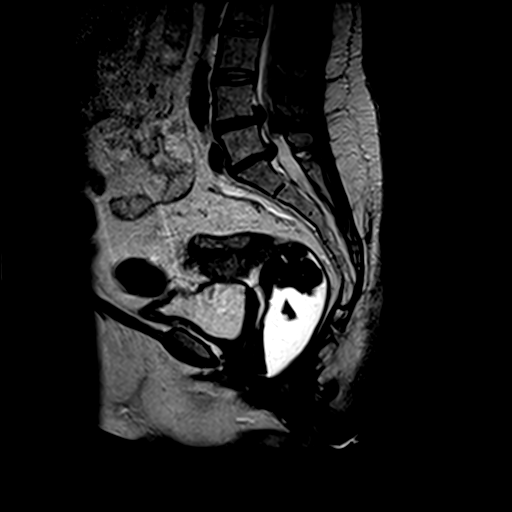
[im 28/37]
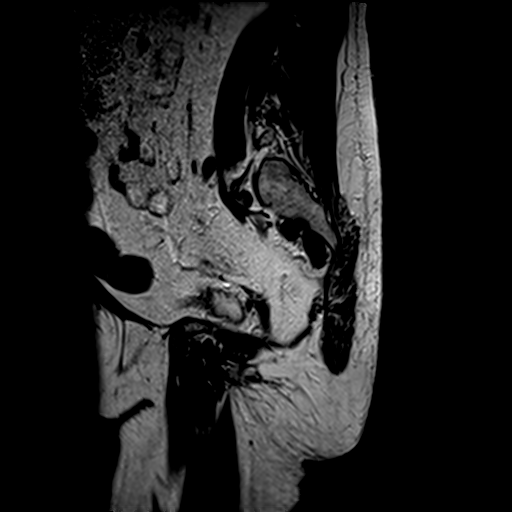
[im 37/37]
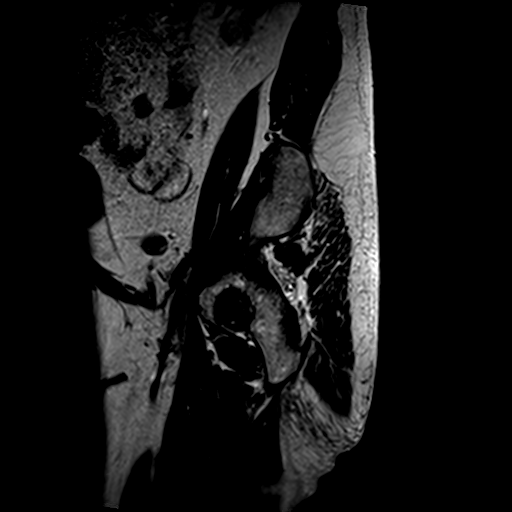

[Series 6: ep2d_diff_b0_100_500 · axial · 5.0mm · 1.82mm/px · z∈[-16,+140]mm · 8 of 81 slices shown]
[im 1/81]
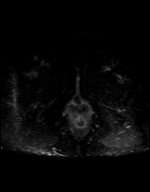
[im 17/81]
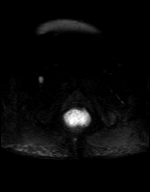
[im 25/81]
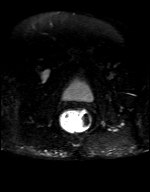
[im 33/81]
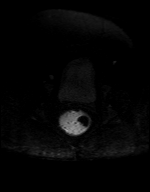
[im 49/81]
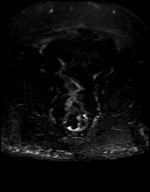
[im 57/81]
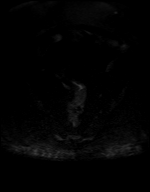
[im 65/81]
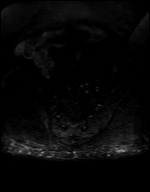
[im 81/81]
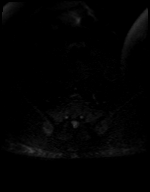

[Series 7: ep2d_diff_b0_100_500_adc · axial · 5.0mm · 1.82mm/px · z∈[-16,+140]mm · 4 of 27 slices shown]
[im 1/27]
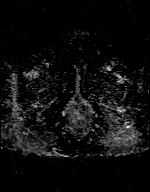
[im 9/27]
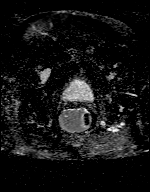
[im 18/27]
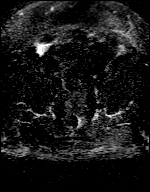
[im 27/27]
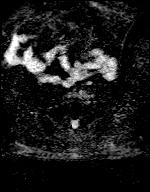

[32 of 48 positions shown; findings below may reference images not displayed]

01/12/2020 abdominopelvic CT.
Prior CTs from [facility].

The colonoscopy report from 01/05/2020, describing a rectosigmoid
lesion at 10-20 cm from the anal verge reviewed.
FINDINGS: Mild motion degradation throughout.

TUMOR LOCATION

Tumor distance from Anal Verge/Skin Surface: On the order of
cm, including on [DATE]

Tumor distance to Internal Anal Sphincter: 8.6 cm, including on [DATE]

TUMOR DESCRIPTION

Circumferential Extent: Circumferential, slightly eccentric left

Tumor Length: 6.2 cm, including on [DATE]

T - CATEGORY

Extension through Muscularis Propria: Suspected, including on images
25 through 27 of series 9. <1mm=T3a

Shortest Distance of any tumor/node from Mesorectal Fascia: 13 mm

Extramural Vascular Invasion/Tumor Thrombus: No

Invasion of Anterior Peritoneal Reflection: No. No gross invasion of
the anterior peritoneal reflection. However, tumor is intimately
associated with it, including on [DATE]

Involvement of Adjacent Organs or Pelvic Sidewall: No

Levator Ani Involvement: No

N - CATEGORY

Mesorectal Lymph Nodes >=5mm: N0

Extra-mesorectal Lymphadenopathy: No

Other: No significant free fluid. Hysterectomy. A non pathologically
sized node within the low sigmoid mesocolon measures 4 mm on [DATE].
IMPRESSION: High rectal adenocarcinoma T stage: T3a

Rectal adenocarcinoma N stage:  0

Distance from tumor to the internal anal sphincter is 8.6 cm.

## 2020-09-14 NOTE — Progress Notes (Deleted)
[facility]  [address] [place],  [place]  [address] [phone]  Clinic Day:  09/01/2020  Referring physician: Bonnita Nasuti, MD   HISTORY OF PRESENT ILLNESS:  The patient is a 54 y.o. female with stage IIB (T3 N0 M0) rectal cancer, who comes in today to be evaluated before heading into her 5th cycle of adjuvant FOLFOX chemotherapy.  The patient claims to have tolerated her 4th cycle of adjuvant FOLFOX chemotherapy fairly well.  She denies having any GI symptoms which concern her for persistent disease.  She is status post neoadjuvant chemoradiation, which consisted of infusional 5-fluorouracil.  The patient also underwent a low anterior resection of her rectal cancer in early September 2021.     PHYSICAL EXAM:  There were no vitals taken for this visit. Wt Readings from Last 3 Encounters:  09/04/20 177 lb 12 oz (80.6 kg)  09/01/20 175 lb (79.4 kg)  08/25/20 175 lb 12 oz (79.7 kg)   There is no height or weight on file to calculate BMI. Performance status (ECOG): 1 Physical Exam Constitutional:      Appearance: Normal appearance. She is not ill-appearing.  HENT:     Mouth/Throat:     Mouth: Mucous membranes are moist.     Pharynx: Oropharynx is clear. No oropharyngeal exudate or posterior oropharyngeal erythema.  Cardiovascular:     Rate and Rhythm: Normal rate and regular rhythm.     Heart sounds: No murmur heard.  No friction rub. No gallop.   Pulmonary:     Effort: Pulmonary effort is normal. No respiratory distress.     Breath sounds: Normal breath sounds. No wheezing, rhonchi or rales.  Abdominal:     General: Bowel sounds are normal. There is no distension.     Palpations: Abdomen is soft. There is no mass.     Tenderness: There is no abdominal tenderness.  Musculoskeletal:        General: No swelling.     Right lower leg: No edema.     Left lower leg: No edema.  Lymphadenopathy:     Cervical: No cervical adenopathy.      Upper Body:     Right upper body: No supraclavicular or axillary adenopathy.     Left upper body: No supraclavicular or axillary adenopathy.     Lower Body: No right inguinal adenopathy. No left inguinal adenopathy.  Skin:    General: Skin is warm.     Coloration: Skin is not jaundiced.     Findings: No lesion or rash.  Neurological:     General: No focal deficit present.     Mental Status: She is alert and oriented to person, place, and time. Mental status is at baseline.     Cranial Nerves: Cranial nerves are intact.  Psychiatric:        Mood and Affect: Mood normal.        Behavior: Behavior normal.        Thought Content: Thought content normal.     LABS:     ASSESSMENT & PLAN:  Assessment/Plan:  A 54 y.o. female with stage IIB rectal cancer.  She will proceed with her 5th cycle of adjuvant FOLFOX chemotherapy today.  Overall, the patient is doing well.  I will see this patient back in 2 weeks before she heads into her 6th cycle of adjuvant FOLFOX chemotherapy.  The patient understands all the plans discussed today and is in agreement with them.  Junita Kubota Macarthur Critchley, MD

## 2020-09-15 ENCOUNTER — Inpatient Hospital Stay: Payer: 59

## 2020-09-15 ENCOUNTER — Telehealth: Payer: Self-pay | Admitting: Oncology

## 2020-09-15 ENCOUNTER — Inpatient Hospital Stay: Payer: 59 | Admitting: Oncology

## 2020-09-15 NOTE — Telephone Encounter (Signed)
Patient called to Reschedule today's Labs, Follow Up to 12/7 - Ok per Estill Dooms

## 2020-09-17 ENCOUNTER — Other Ambulatory Visit: Payer: Self-pay | Admitting: Oncology

## 2020-09-17 DIAGNOSIS — C20 Malignant neoplasm of rectum: Secondary | ICD-10-CM

## 2020-09-17 NOTE — Progress Notes (Signed)
[facility]  [address] [place],  [place]  [address] [phone]  Clinic Day:  09/18/2020  Referring physician: Bonnita Nasuti, MD   HISTORY OF PRESENT ILLNESS:  The patient is a 54 y.o. female with stage IIB (T3 N0 M0) rectal cancer, who comes in today to be evaluated before heading into her 5th cycle of adjuvant FOLFOX chemotherapy.  The patient claims to have tolerated her 4th cycle of adjuvant FOLFOX chemotherapy fairly well.  She denies having any GI symptoms which concern her for persistent disease.  She is status post neoadjuvant chemoradiation, which consisted of infusional 5-fluorouracil.  The patient also underwent a low anterior resection of her rectal cancer in early September 2021.    PHYSICAL EXAM:  Blood pressure (!) 181/87, pulse 83, temperature 98.3 F (36.8 C), resp. rate 16, height 5\' 2"  (1.575 m), weight 168 lb 8 oz (76.4 kg), SpO2 99 %. Wt Readings from Last 3 Encounters:  09/18/20 168 lb 8 oz (76.4 kg)  09/04/20 177 lb 12 oz (80.6 kg)  09/01/20 175 lb (79.4 kg)   Body mass index is 30.82 kg/m. Performance status (ECOG): 1 Physical Exam Constitutional:      Appearance: Normal appearance. She is not ill-appearing.  HENT:     Mouth/Throat:     Mouth: Mucous membranes are moist.     Pharynx: Oropharynx is clear. No oropharyngeal exudate or posterior oropharyngeal erythema.  Cardiovascular:     Rate and Rhythm: Normal rate and regular rhythm.     Heart sounds: No murmur heard.  No friction rub. No gallop.   Pulmonary:     Effort: Pulmonary effort is normal. No respiratory distress.     Breath sounds: Normal breath sounds. No wheezing, rhonchi or rales.  Abdominal:     General: Bowel sounds are normal. There is no distension.     Palpations: Abdomen is soft. There is no mass.     Tenderness: There is no abdominal tenderness.  Musculoskeletal:        General: No swelling.     Right lower leg: No edema.     Left lower  leg: No edema.  Lymphadenopathy:     Cervical: No cervical adenopathy.     Upper Body:     Right upper body: No supraclavicular or axillary adenopathy.     Left upper body: No supraclavicular or axillary adenopathy.     Lower Body: No right inguinal adenopathy. No left inguinal adenopathy.  Skin:    General: Skin is warm.     Coloration: Skin is not jaundiced.     Findings: No lesion or rash.  Neurological:     General: No focal deficit present.     Mental Status: She is alert and oriented to person, place, and time. Mental status is at baseline.     Cranial Nerves: Cranial nerves are intact.  Psychiatric:        Mood and Affect: Mood normal.        Behavior: Behavior normal.        Thought Content: Thought content normal.     LABS:    Ref. Range 09/18/2020 00:00  Sodium Latest Ref Range: 137 - 147  135 (A)  Potassium Latest Ref Range: 3.4 - 5.3  4.4  Chloride Latest Ref Range: 99 - 108  102  CO2 Latest Ref Range: 13 - 22  23 (A)  Glucose Unknown 371  BUN Latest Ref Range: 4 - 21  21  Creatinine Latest Ref Range: 0.5 - 1.1  1.1  Calcium Latest Ref Range: 8.7 - 10.7  9.6  Alkaline Phosphatase Latest Ref Range: 25 - 125  76  Albumin Latest Ref Range: 3.5 - 5.0  4.2  AST Latest Ref Range: 13 - 35  20  ALT Latest Ref Range: 7 - 35  14  Bilirubin, Total Unknown 0.5  WBC Unknown 4.2  RBC Latest Ref Range: 3.87 - 5.11  4.17  Hemoglobin Latest Ref Range: 12.0 - 16.0  11.2 (A)  HCT Latest Ref Range: 36 - 46  33 (A)  MCV Latest Ref Range: 76 - 111  80  Platelets Latest Ref Range: 150 - 399  220    ASSESSMENT & PLAN:  Assessment/Plan:  A 55 y.o. female with stage IIB rectal cancer.  She will proceed with her 5th cycle of adjuvant FOLFOX chemotherapy this week. Overall, the patient is doing well.  I will see this patient back in 2 weeks before she heads into her 6th cycle of adjuvant FOLFOX chemotherapy.  The patient understands all the plans discussed today and is in agreement with  them.      Briton Sellman Macarthur Critchley, MD

## 2020-09-18 ENCOUNTER — Other Ambulatory Visit: Payer: Self-pay

## 2020-09-18 ENCOUNTER — Other Ambulatory Visit: Payer: Self-pay | Admitting: Hematology and Oncology

## 2020-09-18 ENCOUNTER — Ambulatory Visit: Payer: 59 | Admitting: Podiatry

## 2020-09-18 ENCOUNTER — Encounter: Payer: Self-pay | Admitting: Oncology

## 2020-09-18 ENCOUNTER — Other Ambulatory Visit: Payer: Self-pay | Admitting: Oncology

## 2020-09-18 ENCOUNTER — Inpatient Hospital Stay: Payer: 59 | Attending: Oncology | Admitting: Oncology

## 2020-09-18 ENCOUNTER — Inpatient Hospital Stay: Payer: 59

## 2020-09-18 ENCOUNTER — Telehealth: Payer: Self-pay | Admitting: Oncology

## 2020-09-18 VITALS — BP 181/87 | HR 83 | Temp 98.3°F | Resp 16 | Ht 62.0 in | Wt 168.5 lb

## 2020-09-18 DIAGNOSIS — C20 Malignant neoplasm of rectum: Secondary | ICD-10-CM | POA: Diagnosis not present

## 2020-09-18 DIAGNOSIS — Z5111 Encounter for antineoplastic chemotherapy: Secondary | ICD-10-CM | POA: Insufficient documentation

## 2020-09-18 LAB — BASIC METABOLIC PANEL
BUN: 21 (ref 4–21)
CO2: 23 — AB (ref 13–22)
Chloride: 102 (ref 99–108)
Creatinine: 1.1 (ref 0.5–1.1)
Glucose: 371
Potassium: 4.4 (ref 3.4–5.3)
Sodium: 135 — AB (ref 137–147)

## 2020-09-18 LAB — HEPATIC FUNCTION PANEL
ALT: 14 (ref 7–35)
AST: 20 (ref 13–35)
Alkaline Phosphatase: 76 (ref 25–125)
Bilirubin, Total: 0.5

## 2020-09-18 LAB — COMPREHENSIVE METABOLIC PANEL
Albumin: 4.2 (ref 3.5–5.0)
Calcium: 9.6 (ref 8.7–10.7)

## 2020-09-18 LAB — CBC AND DIFFERENTIAL
HCT: 33 — AB (ref 36–46)
Hemoglobin: 11.2 — AB (ref 12.0–16.0)
Neutrophils Absolute: 3.28
Platelets: 220 (ref 150–399)
WBC: 4.2

## 2020-09-18 LAB — CBC
MCV: 80 (ref 76–111)
RBC: 4.17 (ref 3.87–5.11)

## 2020-09-18 MED FILL — Dexamethasone Sodium Phosphate Inj 100 MG/10ML: INTRAMUSCULAR | Qty: 1 | Status: AC

## 2020-09-18 NOTE — Telephone Encounter (Signed)
Per 12/6 los next ov/chemo appt given to patient.

## 2020-09-19 ENCOUNTER — Inpatient Hospital Stay: Payer: 59

## 2020-09-19 MED FILL — Oxaliplatin IV Soln 100 MG/20ML: INTRAVENOUS | Qty: 30 | Status: AC

## 2020-09-19 MED FILL — Fluorouracil IV Soln 2.5 GM/50ML (50 MG/ML): INTRAVENOUS | Qty: 15 | Status: AC

## 2020-09-19 MED FILL — Leucovorin Calcium For Inj 100 MG: INTRAMUSCULAR | Qty: 2.5 | Status: AC

## 2020-09-19 MED FILL — Fluorouracil IV Soln 5 GM/100ML (50 MG/ML): INTRAVENOUS | Qty: 89 | Status: AC

## 2020-09-21 ENCOUNTER — Inpatient Hospital Stay: Payer: 59

## 2020-09-22 ENCOUNTER — Telehealth: Payer: Self-pay | Admitting: Podiatry

## 2020-09-22 NOTE — Telephone Encounter (Signed)
I called patient and spoke to her about her toe. She said it is blacker and very swollen. I advised her to go to the ED as soon as she can and we will evaluate her for this if called by the ER

## 2020-09-22 NOTE — Telephone Encounter (Signed)
Pt called stating her foot is turning blacker than it was & has a lot of swelling.  You were seeing her for infection.  She says she can go to ER but afraid they will want to do amputation.  pls advise

## 2020-09-26 ENCOUNTER — Telehealth: Payer: Self-pay | Admitting: Oncology

## 2020-09-26 ENCOUNTER — Ambulatory Visit: Payer: Medicare Other

## 2020-09-26 NOTE — Telephone Encounter (Signed)
Patient called to confirmed Appt 12/20 - Last Appt Canceled due to her being sick

## 2020-10-01 ENCOUNTER — Other Ambulatory Visit: Payer: Self-pay | Admitting: Oncology

## 2020-10-01 NOTE — Progress Notes (Signed)
[facility]  [address] [place],  [place]  [address] [phone]  Clinic Day:  09/18/2020  Referring physician: Bonnita Nasuti, MD   HISTORY OF PRESENT ILLNESS:  The patient is a 54 y.o. female with stage IIB (T3 N0 M0) rectal cancer, who comes in today to be evaluated before heading into her 5th cycle of adjuvant FOLFOX chemotherapy.  The patient was scheduled to receive her 5th cycle of adjuvant FOLFOX chemotherapy 2 weeks ago, but developed an infection in her 3rd right toe and vision problems to where she had it delayed.  She comes in today doing okay.  However, her right toe is still causing some problems.  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  She denies having any GI symptoms which concern her for persistent disease.  She is status post neoadjuvant chemoradiation, which consisted of infusional 5-fluorouracil.  The patient also underwent a low anterior resection of her rectal cancer in early September 2021.    PHYSICAL EXAM:  Blood pressure (!) 177/79, pulse 84, temperature 99.1 F (37.3 C), resp. rate 16, height 5\' 2"  (1.575 m), weight 176 lb 1.6 oz (79.9 kg), SpO2 99 %. Wt Readings from Last 3 Encounters:  10/02/20 176 lb 1.6 oz (79.9 kg)  09/18/20 168 lb 8 oz (76.4 kg)  09/04/20 177 lb 12 oz (80.6 kg)   Body mass index is 32.21 kg/m. Performance status (ECOG): 1 Physical Exam Constitutional:      Appearance: Normal appearance. She is not ill-appearing.  HENT:     Mouth/Throat:     Mouth: Mucous membranes are moist.     Pharynx: Oropharynx is clear. No oropharyngeal exudate or posterior oropharyngeal erythema.  Cardiovascular:     Rate and Rhythm: Normal rate and regular rhythm.     Heart sounds: No murmur heard. No friction rub. No gallop.   Pulmonary:     Effort: Pulmonary effort is normal. No respiratory distress.     Breath sounds: Normal breath sounds. No  wheezing, rhonchi or rales.  Chest:  Breasts:     Right: No axillary adenopathy or supraclavicular adenopathy.     Left: No axillary adenopathy or supraclavicular adenopathy.    Abdominal:     General: Bowel sounds are normal. There is no distension.     Palpations: Abdomen is soft. There is no mass.     Tenderness: There is no abdominal tenderness.  Musculoskeletal:        General: No swelling.     Right lower leg: No edema.     Left lower leg: No edema.  Feet:     Right foot:     Skin integrity: Skin breakdown (dusky 3rd right toe) present.  Lymphadenopathy:     Cervical: No cervical adenopathy.     Upper Body:     Right upper body: No supraclavicular or axillary adenopathy.     Left upper body: No supraclavicular or axillary adenopathy.     Lower Body: No right inguinal adenopathy. No left inguinal adenopathy.  Skin:    General: Skin is warm.     Coloration: Skin is not jaundiced.     Findings: No lesion or rash.  Neurological:     General: No focal deficit present.     Mental Status: She is alert and oriented to person, place, and time. Mental status is at baseline.     Cranial Nerves: Cranial nerves are  intact.  Psychiatric:        Mood and Affect: Mood normal.        Behavior: Behavior normal.        Thought Content: Thought content normal.     LABS:     ASSESSMENT & PLAN:  Assessment/Plan:  A 54 y.o. female with stage IIB rectal cancer.  She will proceed with her 5th cycle of adjuvant FOLFOX chemotherapy this week. Overall, I am concerned with the status of her 3rd right toe.  I will prescribe her levofloxacin 750 mg for 10 days.  The patient knows to speak with her podiatrist over these next few weeks if her toe's appearance remains abnormal.  Otherwise, I will see this patient back in 2 weeks before she heads into her 6th cycle of adjuvant FOLFOX chemotherapy.  The patient understands all the plans discussed today and is in agreement with them.    Annalisia Ingber Macarthur Critchley, MD

## 2020-10-02 ENCOUNTER — Other Ambulatory Visit: Payer: Self-pay | Admitting: Hematology and Oncology

## 2020-10-02 ENCOUNTER — Other Ambulatory Visit: Payer: Self-pay

## 2020-10-02 ENCOUNTER — Other Ambulatory Visit: Payer: Self-pay | Admitting: Oncology

## 2020-10-02 ENCOUNTER — Inpatient Hospital Stay (INDEPENDENT_AMBULATORY_CARE_PROVIDER_SITE_OTHER): Payer: 59 | Admitting: Oncology

## 2020-10-02 ENCOUNTER — Inpatient Hospital Stay: Payer: 59

## 2020-10-02 VITALS — BP 177/79 | HR 84 | Temp 99.1°F | Resp 16 | Ht 62.0 in | Wt 176.1 lb

## 2020-10-02 DIAGNOSIS — C20 Malignant neoplasm of rectum: Secondary | ICD-10-CM | POA: Diagnosis not present

## 2020-10-02 LAB — CBC AND DIFFERENTIAL
HCT: 33 — AB (ref 36–46)
Hemoglobin: 10.9 — AB (ref 12.0–16.0)
Neutrophils Absolute: 4.6
Platelets: 269 (ref 150–399)
WBC: 5.9

## 2020-10-02 LAB — HEPATIC FUNCTION PANEL
ALT: 14 (ref 7–35)
AST: 21 (ref 13–35)
Alkaline Phosphatase: 51 (ref 25–125)
Bilirubin, Total: 0.3

## 2020-10-02 LAB — BASIC METABOLIC PANEL
BUN: 19 (ref 4–21)
CO2: 26 — AB (ref 13–22)
Chloride: 103 (ref 99–108)
Creatinine: 1.3 — AB (ref 0.5–1.1)
Glucose: 182
Potassium: 4 (ref 3.4–5.3)
Sodium: 141 (ref 137–147)

## 2020-10-02 LAB — COMPREHENSIVE METABOLIC PANEL
Albumin: 3.8 (ref 3.5–5.0)
Calcium: 9 (ref 8.7–10.7)

## 2020-10-02 LAB — CBC: RBC: 4.05 (ref 3.87–5.11)

## 2020-10-02 MED ORDER — LEVOFLOXACIN 750 MG PO TABS: 750.0000 mg | ORAL_TABLET | Freq: Every day | ORAL | 0 refills | Status: AC

## 2020-10-02 NOTE — Progress Notes (Signed)
Patient voiced concerns of infection in toe on right foot.  Toe is discolored, swollen and painful.  Issues with transportation for appt with Dr. March Rummage.  [facility] is aware.

## 2020-10-03 ENCOUNTER — Inpatient Hospital Stay: Payer: 59

## 2020-10-03 VITALS — BP 178/78 | HR 82 | Temp 98.8°F | Resp 18 | Ht 62.0 in | Wt 172.0 lb

## 2020-10-03 DIAGNOSIS — C20 Malignant neoplasm of rectum: Secondary | ICD-10-CM | POA: Diagnosis present

## 2020-10-03 DIAGNOSIS — Z5111 Encounter for antineoplastic chemotherapy: Secondary | ICD-10-CM | POA: Diagnosis present

## 2020-10-03 MED ORDER — PALONOSETRON HCL INJECTION 0.25 MG/5ML
0.2500 mg | Freq: Once | INTRAVENOUS | Status: AC
  Administered 2020-10-03: 0.25 mg via INTRAVENOUS

## 2020-10-03 MED ORDER — LEUCOVORIN CALCIUM INJECTION 100 MG
50.0000 mg | Freq: Once | INTRAMUSCULAR | Status: AC
  Administered 2020-10-03: 50 mg via INTRAVENOUS
  Filled 2020-10-03: qty 2.5

## 2020-10-03 MED ORDER — HEPARIN SOD (PORK) LOCK FLUSH 100 UNIT/ML IV SOLN
500.0000 [IU] | Freq: Once | INTRAVENOUS | Status: DC | PRN
  Filled 2020-10-03: qty 5

## 2020-10-03 MED ORDER — PALONOSETRON HCL INJECTION 0.25 MG/5ML
INTRAVENOUS | Status: AC
  Filled 2020-10-03: qty 5

## 2020-10-03 MED ORDER — DEXTROSE 5 % IV SOLN
Freq: Once | INTRAVENOUS | Status: AC
  Filled 2020-10-03: qty 250

## 2020-10-03 MED ORDER — SODIUM CHLORIDE 0.9 % IV SOLN
10.0000 mg | Freq: Once | INTRAVENOUS | Status: AC
  Administered 2020-10-03: 10 mg via INTRAVENOUS
  Filled 2020-10-03: qty 10

## 2020-10-03 MED ORDER — SODIUM CHLORIDE 0.9 % IV SOLN
2400.0000 mg/m2 | INTRAVENOUS | Status: DC
  Administered 2020-10-03: 4450 mg via INTRAVENOUS
  Filled 2020-10-03: qty 89

## 2020-10-03 MED ORDER — DEXTROSE 5 % IV SOLN
Freq: Once | INTRAVENOUS | Status: DC
  Filled 2020-10-03: qty 250

## 2020-10-03 MED ORDER — SODIUM CHLORIDE 0.9% FLUSH
10.0000 mL | INTRAVENOUS | Status: DC | PRN
  Administered 2020-10-03: 10 mL
  Filled 2020-10-03: qty 10

## 2020-10-03 MED ORDER — OXALIPLATIN CHEMO INJECTION 100 MG/20ML
81.0000 mg/m2 | Freq: Once | INTRAVENOUS | Status: AC
  Administered 2020-10-03: 150 mg via INTRAVENOUS
  Filled 2020-10-03: qty 10

## 2020-10-03 MED ORDER — FLUOROURACIL CHEMO INJECTION 2.5 GM/50ML
400.0000 mg/m2 | Freq: Once | INTRAVENOUS | Status: AC
  Administered 2020-10-03: 750 mg via INTRAVENOUS
  Filled 2020-10-03: qty 15

## 2020-10-03 NOTE — Patient Instructions (Signed)
[facility] Discharge Instructions for Patients Receiving Chemotherapy  Today you received the following chemotherapy agents Oxaliplatin,Leucovorin,Fluorouracil  To help prevent nausea and vomiting after your treatment, we encourage you to take your nausea medication as directed.   If you develop nausea and vomiting that is not controlled by your nausea medication, call the clinic.   BELOW ARE SYMPTOMS THAT SHOULD BE REPORTED IMMEDIATELY:  *FEVER GREATER THAN 100.5 F  *CHILLS WITH OR WITHOUT FEVER  NAUSEA AND VOMITING THAT IS NOT CONTROLLED WITH YOUR NAUSEA MEDICATION  *UNUSUAL SHORTNESS OF BREATH  *UNUSUAL BRUISING OR BLEEDING  TENDERNESS IN MOUTH AND THROAT WITH OR WITHOUT PRESENCE OF ULCERS  *URINARY PROBLEMS  *BOWEL PROBLEMS  UNUSUAL RASH Items with * indicate a potential emergency and should be followed up as soon as possible.  Feel free to call the clinic should you have any questions or concerns at The clinic phone number is [phone].  Please show the [facility] at check-in to the Emergency Department and triage nurse.

## 2020-10-03 NOTE — Progress Notes (Signed)
PT STABLE AT TIME OF DISCHARGE 

## 2020-10-05 ENCOUNTER — Inpatient Hospital Stay: Payer: 59

## 2020-10-05 ENCOUNTER — Other Ambulatory Visit: Payer: Self-pay

## 2020-10-05 VITALS — BP 191/81 | HR 77 | Temp 98.0°F | Resp 18 | Ht 62.0 in | Wt 174.2 lb

## 2020-10-05 DIAGNOSIS — C20 Malignant neoplasm of rectum: Secondary | ICD-10-CM | POA: Diagnosis not present

## 2020-10-05 MED ORDER — SODIUM CHLORIDE 0.9% FLUSH
10.0000 mL | INTRAVENOUS | Status: DC | PRN
  Administered 2020-10-05: 10 mL
  Filled 2020-10-05: qty 10

## 2020-10-05 MED ORDER — HEPARIN SOD (PORK) LOCK FLUSH 100 UNIT/ML IV SOLN
500.0000 [IU] | Freq: Once | INTRAVENOUS | Status: AC | PRN
  Administered 2020-10-05: 500 [IU]
  Filled 2020-10-05: qty 5

## 2020-10-05 NOTE — Patient Instructions (Signed)
[facility] Discharge Instructions for Patients Receiving Chemotherapy  Today you received the following chemotherapy agents 5FLUOROURACIL  To help prevent nausea and vomiting after your treatment, we encourage you to take your nausea medication.   If you develop nausea and vomiting that is not controlled by your nausea medication, call the clinic.   BELOW ARE SYMPTOMS THAT SHOULD BE REPORTED IMMEDIATELY:  *FEVER GREATER THAN 100.5 F  *CHILLS WITH OR WITHOUT FEVER  NAUSEA AND VOMITING THAT IS NOT CONTROLLED WITH YOUR NAUSEA MEDICATION  *UNUSUAL SHORTNESS OF BREATH  *UNUSUAL BRUISING OR BLEEDING  TENDERNESS IN MOUTH AND THROAT WITH OR WITHOUT PRESENCE OF ULCERS  *URINARY PROBLEMS  *BOWEL PROBLEMS  UNUSUAL RASH Items with * indicate a potential emergency and should be followed up as soon as possible.  Feel free to call the clinic should you have any questions or concerns at The clinic phone number is [phone].  Please show the CHEMO ALERT CARD at check-in to the Emergency Department and triage nurse.   

## 2020-10-09 NOTE — Progress Notes (Signed)
Sent in Transportation request to [facility], for 0104 and 01/06

## 2020-10-15 NOTE — Progress Notes (Deleted)
[facility]  [address] [place],  [place]  [address] [phone]  Clinic Day:  09/18/2020  Referring physician: Galvin Proffer, MD   HISTORY OF PRESENT ILLNESS:  The patient is a 55 y.o. female with stage IIB (T3 N0 M0) rectal cancer, who comes in today to be evaluated before heading into her 6th cycle of adjuvant FOLFOX chemotherapy.  The patient claims to have tolerated her 5th cycle of adjuvant FOLFOX chemotherapy fairly well.  She still is dealing with an infection in her right 3rd toe. I recently gave her a 2-week course of levofloxacin, for which the patient claims....... 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  She denies having any GI symptoms which concern her for persistent disease.  She is status post neoadjuvant chemoradiation, which consisted of infusional 5-fluorouracil.  The patient also underwent a low anterior resection of her rectal cancer in early September 2021.    PHYSICAL EXAM:  There were no vitals taken for this visit. Wt Readings from Last 3 Encounters:  10/05/20 174 lb 4 oz (79 kg)  10/03/20 172 lb (78 kg)  10/02/20 176 lb 1.6 oz (79.9 kg)   There is no height or weight on file to calculate BMI. Performance status (ECOG): 1 Physical Exam Constitutional:      Appearance: Normal appearance. She is not ill-appearing.  HENT:     Mouth/Throat:     Mouth: Mucous membranes are moist.     Pharynx: Oropharynx is clear. No oropharyngeal exudate or posterior oropharyngeal erythema.  Cardiovascular:     Rate and Rhythm: Normal rate and regular rhythm.     Heart sounds: No murmur heard. No friction rub. No gallop.   Pulmonary:     Effort: Pulmonary effort is normal. No respiratory distress.     Breath sounds: Normal breath sounds. No wheezing, rhonchi or rales.  Chest:  Breasts:     Right: No axillary adenopathy or supraclavicular  adenopathy.     Left: No axillary adenopathy or supraclavicular adenopathy.    Abdominal:     General: Bowel sounds are normal. There is no distension.     Palpations: Abdomen is soft. There is no mass.     Tenderness: There is no abdominal tenderness.  Musculoskeletal:        General: No swelling.     Right lower leg: No edema.     Left lower leg: No edema.  Feet:     Right foot:     Skin integrity: Skin breakdown (dusky 3rd right toe) present.  Lymphadenopathy:     Cervical: No cervical adenopathy.     Upper Body:     Right upper body: No supraclavicular or axillary adenopathy.     Left upper body: No supraclavicular or axillary adenopathy.     Lower Body: No right inguinal adenopathy. No left inguinal adenopathy.  Skin:    General: Skin is warm.     Coloration: Skin is not jaundiced.     Findings: No lesion or rash.  Neurological:     General: No focal deficit present.     Mental Status: She is alert and oriented to person, place, and time. Mental status is at baseline.     Cranial Nerves: Cranial nerves are intact.  Psychiatric:        Mood and Affect: Mood normal.        Behavior: Behavior normal.  Thought Content: Thought content normal.     LABS:     ASSESSMENT & PLAN:  Assessment/Plan:  A 55 y.o. female with stage IIB rectal cancer.  She will proceed with her 6th cycle of adjuvant FOLFOX chemotherapy this week. Overall, she appears to be doing fairly well.  I will see this patient back in 2 weeks before she heads into her 7th cycle of adjuvant FOLFOX chemotherapy.  The patient understands all the plans discussed today and is in agreement with them.    Prudy Candy Macarthur Critchley, MD

## 2020-10-16 ENCOUNTER — Inpatient Hospital Stay: Payer: 59

## 2020-10-16 ENCOUNTER — Inpatient Hospital Stay: Payer: 59 | Admitting: Oncology

## 2020-10-16 ENCOUNTER — Encounter: Payer: Self-pay | Admitting: Oncology

## 2020-10-16 NOTE — Progress Notes (Signed)
Patients appointment was rescheduled from today to 10/18/20 to see Dr. Melvyn Neth. I set up transportation through [facility]. Her infusion will be rescheduled as well. Once I get those dates I will reschedule transportation for this as well.

## 2020-10-17 ENCOUNTER — Other Ambulatory Visit: Payer: Self-pay | Admitting: Pharmacist

## 2020-10-17 ENCOUNTER — Inpatient Hospital Stay: Payer: 59

## 2020-10-17 NOTE — Progress Notes (Signed)
[facility]  [address] [place],  [place]  [address] [phone]  Clinic Day:  10/19/2019  Referring physician: Galvin Proffer, MD   HISTORY OF PRESENT ILLNESS:  The patient is a 55 y.o. female with stage IIB (T3 N0 M0) rectal cancer, who comes in today to be evaluated before heading into her 6th cycle of adjuvant FOLFOX chemotherapy.  The patient claims to have tolerated her 5th cycle of adjuvant FOLFOX chemotherapy fairly well.  She is also pleased as the previous infection in her right 3rd toe cleared after a 2-week course of levofloxacin.  With respect to her cancer, she denies having any GI symptoms which concern her for persistent disease.  She is already status post neoadjuvant chemoradiation, which consisted of infusional 5-fluorouracil.  The patient also underwent a low anterior resection of her rectal cancer in early September 2021.    PHYSICAL EXAM:  Blood pressure (!) 181/79, pulse 76, temperature 98.5 F (36.9 C), resp. rate 16, height 5\' 2"  (1.575 m), weight 175 lb (79.4 kg), SpO2 99 %. Wt Readings from Last 3 Encounters:  10/18/20 175 lb (79.4 kg)  10/05/20 174 lb 4 oz (79 kg)  10/03/20 172 lb (78 kg)   Body mass index is 32.01 kg/m. Performance status (ECOG): 1 Physical Exam Constitutional:      Appearance: Normal appearance. She is not ill-appearing.  HENT:     Mouth/Throat:     Mouth: Mucous membranes are moist.     Pharynx: Oropharynx is clear. No oropharyngeal exudate or posterior oropharyngeal erythema.  Cardiovascular:     Rate and Rhythm: Normal rate and regular rhythm.     Heart sounds: No murmur heard. No friction rub. No gallop.   Pulmonary:     Effort: Pulmonary effort is normal. No respiratory distress.     Breath sounds: Normal breath sounds. No wheezing, rhonchi or rales.  Chest:  Breasts:     Right: No axillary adenopathy or supraclavicular adenopathy.     Left: No axillary adenopathy or supraclavicular  adenopathy.    Abdominal:     General: Bowel sounds are normal. There is no distension.     Palpations: Abdomen is soft. There is no mass.     Tenderness: There is no abdominal tenderness.  Musculoskeletal:        General: No swelling.     Right lower leg: No edema.     Left lower leg: No edema.  Lymphadenopathy:     Cervical: No cervical adenopathy.     Upper Body:     Right upper body: No supraclavicular or axillary adenopathy.     Left upper body: No supraclavicular or axillary adenopathy.     Lower Body: No right inguinal adenopathy. No left inguinal adenopathy.  Skin:    General: Skin is warm.     Coloration: Skin is not jaundiced.     Findings: No lesion or rash.  Neurological:     General: No focal deficit present.     Mental Status: She is alert and oriented to person, place, and time. Mental status is at baseline.     Cranial Nerves: Cranial nerves are intact.  Psychiatric:        Mood and Affect: Mood normal.        Behavior: Behavior normal.        Thought Content: Thought content normal.     LABS:    Ref. Range 10/18/2020 00:00  Sodium Latest Ref Range: 137 -  147  135 (A)  Potassium Latest Ref Range: 3.4 - 5.3  4.4  Chloride Latest Ref Range: 99 - 108  106  CO2 Latest Ref Range: 13 - 22  20  Glucose Unknown 173  BUN Latest Ref Range: 4 - 21  22 (A)  Creatinine Latest Ref Range: 0.5 - 1.1  1.1  Calcium Latest Ref Range: 8.7 - 10.7  8.9  Alkaline Phosphatase Latest Ref Range: 25 - 125  38  Albumin Latest Ref Range: 3.5 - 5.0  3.8  AST Latest Ref Range: 13 - 35  22  ALT Latest Ref Range: 7 - 35  11  Bilirubin, Total Unknown 0.2  WBC Unknown 4.1  RBC Latest Ref Range: 3.87 - 5.11  3.82 (A)  Hemoglobin Latest Ref Range: 12.0 - 16.0  10.5 (A)  HCT Latest Ref Range: 36 - 46  32 (A)  Platelets Latest Ref Range: 150 - 399  268  NEUT# Unknown 2.83    ASSESSMENT & PLAN:  Assessment/Plan:  A 55 y.o. female with stage IIB rectal cancer.  She will proceed with her  6th cycle of adjuvant FOLFOX chemotherapy this week. Overall, she appears to be doing fairly well.  I will see this patient back in 2 weeks before she heads into her 7th cycle of adjuvant FOLFOX chemotherapy.  The patient understands all the plans discussed today and is in agreement with them.    Dorian Duval Kirby Funk, MD

## 2020-10-18 ENCOUNTER — Telehealth: Payer: Self-pay | Admitting: Oncology

## 2020-10-18 ENCOUNTER — Other Ambulatory Visit: Payer: Self-pay

## 2020-10-18 ENCOUNTER — Inpatient Hospital Stay: Payer: 59 | Attending: Oncology

## 2020-10-18 ENCOUNTER — Other Ambulatory Visit: Payer: Self-pay | Admitting: Hematology and Oncology

## 2020-10-18 ENCOUNTER — Inpatient Hospital Stay (INDEPENDENT_AMBULATORY_CARE_PROVIDER_SITE_OTHER): Payer: 59 | Admitting: Oncology

## 2020-10-18 ENCOUNTER — Other Ambulatory Visit: Payer: Self-pay | Admitting: Oncology

## 2020-10-18 VITALS — BP 181/79 | HR 76 | Temp 98.5°F | Resp 16 | Ht 62.0 in | Wt 175.0 lb

## 2020-10-18 DIAGNOSIS — Z5111 Encounter for antineoplastic chemotherapy: Secondary | ICD-10-CM | POA: Insufficient documentation

## 2020-10-18 DIAGNOSIS — R112 Nausea with vomiting, unspecified: Secondary | ICD-10-CM | POA: Insufficient documentation

## 2020-10-18 DIAGNOSIS — M549 Dorsalgia, unspecified: Secondary | ICD-10-CM | POA: Insufficient documentation

## 2020-10-18 DIAGNOSIS — G8929 Other chronic pain: Secondary | ICD-10-CM | POA: Insufficient documentation

## 2020-10-18 DIAGNOSIS — C20 Malignant neoplasm of rectum: Secondary | ICD-10-CM | POA: Insufficient documentation

## 2020-10-18 DIAGNOSIS — K5909 Other constipation: Secondary | ICD-10-CM | POA: Insufficient documentation

## 2020-10-18 DIAGNOSIS — Z79891 Long term (current) use of opiate analgesic: Secondary | ICD-10-CM | POA: Insufficient documentation

## 2020-10-18 DIAGNOSIS — E119 Type 2 diabetes mellitus without complications: Secondary | ICD-10-CM | POA: Insufficient documentation

## 2020-10-18 LAB — COMPREHENSIVE METABOLIC PANEL
Albumin: 3.8 (ref 3.5–5.0)
Calcium: 8.9 (ref 8.7–10.7)

## 2020-10-18 LAB — HEPATIC FUNCTION PANEL
ALT: 11 (ref 7–35)
AST: 22 (ref 13–35)
Alkaline Phosphatase: 38 (ref 25–125)
Bilirubin, Total: 0.2

## 2020-10-18 LAB — CBC AND DIFFERENTIAL
HCT: 32 — AB (ref 36–46)
Hemoglobin: 10.5 — AB (ref 12.0–16.0)
Neutrophils Absolute: 2.83
Platelets: 268 (ref 150–399)
WBC: 4.1

## 2020-10-18 LAB — BASIC METABOLIC PANEL
BUN: 22 — AB (ref 4–21)
CO2: 20 (ref 13–22)
Chloride: 106 (ref 99–108)
Creatinine: 1.1 (ref 0.5–1.1)
Glucose: 173
Potassium: 4.4 (ref 3.4–5.3)
Sodium: 135 — AB (ref 137–147)

## 2020-10-18 LAB — CBC: RBC: 3.82 — AB (ref 3.87–5.11)

## 2020-10-18 NOTE — Progress Notes (Signed)
Sent in request for transportation for patient for 10/24/20 AND 10/26/20, through [employer].

## 2020-10-18 NOTE — Telephone Encounter (Signed)
Per 1/5 LOS, patient scheduled for 1/19 Labs, Follow Up, 1/25 Infusion, 1/27 Pump D/C

## 2020-10-19 ENCOUNTER — Inpatient Hospital Stay: Payer: 59

## 2020-10-24 ENCOUNTER — Other Ambulatory Visit: Payer: Self-pay | Admitting: Hematology and Oncology

## 2020-10-24 ENCOUNTER — Other Ambulatory Visit: Payer: Self-pay

## 2020-10-24 ENCOUNTER — Inpatient Hospital Stay: Payer: 59

## 2020-10-24 VITALS — BP 154/61 | HR 84 | Temp 97.7°F | Resp 18 | Ht 62.0 in | Wt 174.2 lb

## 2020-10-24 DIAGNOSIS — K5909 Other constipation: Secondary | ICD-10-CM | POA: Diagnosis not present

## 2020-10-24 DIAGNOSIS — Z5111 Encounter for antineoplastic chemotherapy: Secondary | ICD-10-CM | POA: Diagnosis present

## 2020-10-24 DIAGNOSIS — E119 Type 2 diabetes mellitus without complications: Secondary | ICD-10-CM | POA: Diagnosis not present

## 2020-10-24 DIAGNOSIS — R112 Nausea with vomiting, unspecified: Secondary | ICD-10-CM | POA: Diagnosis not present

## 2020-10-24 DIAGNOSIS — C20 Malignant neoplasm of rectum: Secondary | ICD-10-CM

## 2020-10-24 DIAGNOSIS — M549 Dorsalgia, unspecified: Secondary | ICD-10-CM | POA: Diagnosis not present

## 2020-10-24 DIAGNOSIS — Z79891 Long term (current) use of opiate analgesic: Secondary | ICD-10-CM | POA: Diagnosis not present

## 2020-10-24 DIAGNOSIS — G8929 Other chronic pain: Secondary | ICD-10-CM | POA: Diagnosis not present

## 2020-10-24 MED ORDER — SODIUM CHLORIDE 0.9 % IV SOLN
150.0000 mg | Freq: Once | INTRAVENOUS | Status: AC
  Administered 2020-10-24: 150 mg via INTRAVENOUS
  Filled 2020-10-24: qty 5

## 2020-10-24 MED ORDER — SODIUM CHLORIDE 0.9 % IV SOLN
2400.0000 mg/m2 | INTRAVENOUS | Status: DC
  Administered 2020-10-24: 4450 mg via INTRAVENOUS
  Filled 2020-10-24 (×2): qty 89

## 2020-10-24 MED ORDER — PALONOSETRON HCL INJECTION 0.25 MG/5ML
0.2500 mg | Freq: Once | INTRAVENOUS | Status: AC
  Administered 2020-10-24: 0.25 mg via INTRAVENOUS

## 2020-10-24 MED ORDER — PROCHLORPERAZINE MALEATE 10 MG PO TABS: 10.0000 mg | ORAL_TABLET | Freq: Four times a day (QID) | ORAL | 2 refills | Status: AC | PRN

## 2020-10-24 MED ORDER — LEUCOVORIN CALCIUM INJECTION 100 MG
50.0000 mg | Freq: Once | INTRAMUSCULAR | Status: AC
  Administered 2020-10-24: 50 mg via INTRAVENOUS
  Filled 2020-10-24: qty 2.5

## 2020-10-24 MED ORDER — OXALIPLATIN CHEMO INJECTION 100 MG/20ML
81.0000 mg/m2 | Freq: Once | INTRAVENOUS | Status: AC
  Administered 2020-10-24: 150 mg via INTRAVENOUS
  Filled 2020-10-24: qty 30
  Filled 2020-10-24: qty 10

## 2020-10-24 MED ORDER — DEXTROSE 5 % IV SOLN
Freq: Once | INTRAVENOUS | Status: AC
  Filled 2020-10-24: qty 250

## 2020-10-24 MED ORDER — FLUOROURACIL CHEMO INJECTION 2.5 GM/50ML
400.0000 mg/m2 | Freq: Once | INTRAVENOUS | Status: AC
  Administered 2020-10-24: 750 mg via INTRAVENOUS
  Filled 2020-10-24 (×2): qty 15

## 2020-10-24 MED ORDER — SODIUM CHLORIDE 0.9 % IV SOLN
10.0000 mg | Freq: Once | INTRAVENOUS | Status: AC
  Administered 2020-10-24: 10 mg via INTRAVENOUS
  Filled 2020-10-24: qty 10
  Filled 2020-10-24: qty 1

## 2020-10-24 MED ORDER — PALONOSETRON HCL INJECTION 0.25 MG/5ML
INTRAVENOUS | Status: AC
  Filled 2020-10-24: qty 5

## 2020-10-24 MED ORDER — ONDANSETRON HCL 4 MG PO TABS: 4.0000 mg | ORAL_TABLET | ORAL | 3 refills | Status: DC | PRN

## 2020-10-24 NOTE — Progress Notes (Signed)
1505: PT STABLE AT TIME OF DISCHARGE  

## 2020-10-24 NOTE — Patient Instructions (Signed)
[facility] Discharge Instructions for Patients Receiving Chemotherapy  Today you received the following chemotherapy agents OXALIPLATIN,FLUOROURACIL,LEUCOVORIN  To help prevent nausea and vomiting after your treatment, we encourage you to take your nausea medication AS DIRECTED.   If you develop nausea and vomiting that is not controlled by your nausea medication, call the clinic.   BELOW ARE SYMPTOMS THAT SHOULD BE REPORTED IMMEDIATELY:  *FEVER GREATER THAN 100.5 F  *CHILLS WITH OR WITHOUT FEVER  NAUSEA AND VOMITING THAT IS NOT CONTROLLED WITH YOUR NAUSEA MEDICATION  *UNUSUAL SHORTNESS OF BREATH  *UNUSUAL BRUISING OR BLEEDING  TENDERNESS IN MOUTH AND THROAT WITH OR WITHOUT PRESENCE OF ULCERS  *URINARY PROBLEMS  *BOWEL PROBLEMS  UNUSUAL RASH Items with * indicate a potential emergency and should be followed up as soon as possible.  Feel free to call the clinic should you have any questions or concerns at The clinic phone number is [phone].  Please show the [place] at check-in to the Emergency Department and triage nurse.

## 2020-10-26 ENCOUNTER — Inpatient Hospital Stay: Payer: 59

## 2020-10-26 ENCOUNTER — Other Ambulatory Visit: Payer: Self-pay

## 2020-10-26 VITALS — BP 150/74 | HR 84 | Temp 98.9°F | Resp 18 | Ht 62.0 in | Wt 177.2 lb

## 2020-10-26 DIAGNOSIS — C20 Malignant neoplasm of rectum: Secondary | ICD-10-CM | POA: Diagnosis not present

## 2020-10-26 MED ORDER — SODIUM CHLORIDE 0.9% FLUSH
10.0000 mL | INTRAVENOUS | Status: DC | PRN
Start: 2020-10-26 — End: 2020-10-26
  Administered 2020-10-26: 10 mL
  Filled 2020-10-26: qty 10

## 2020-10-26 MED ORDER — HEPARIN SOD (PORK) LOCK FLUSH 100 UNIT/ML IV SOLN
500.0000 [IU] | Freq: Once | INTRAVENOUS | Status: AC | PRN
  Administered 2020-10-26: 500 [IU]
  Filled 2020-10-26: qty 5

## 2020-10-26 NOTE — Patient Instructions (Signed)
[facility] Discharge Instructions for Patients Receiving Chemotherapy  Today you received the following chemotherapy agents 5fluorouracil  To help prevent nausea and vomiting after your treatment, we encourage you to take your nausea medication.   If you develop nausea and vomiting that is not controlled by your nausea medication, call the clinic.   BELOW ARE SYMPTOMS THAT SHOULD BE REPORTED IMMEDIATELY:  *FEVER GREATER THAN 100.5 F  *CHILLS WITH OR WITHOUT FEVER  NAUSEA AND VOMITING THAT IS NOT CONTROLLED WITH YOUR NAUSEA MEDICATION  *UNUSUAL SHORTNESS OF BREATH  *UNUSUAL BRUISING OR BLEEDING  TENDERNESS IN MOUTH AND THROAT WITH OR WITHOUT PRESENCE OF ULCERS  *URINARY PROBLEMS  *BOWEL PROBLEMS  UNUSUAL RASH Items with * indicate a potential emergency and should be followed up as soon as possible.  Feel free to call the clinic should you have any questions or concerns at The clinic phone number is [phone].  Please show the CHEMO ALERT CARD at check-in to the Emergency Department and triage nurse.   

## 2020-11-01 ENCOUNTER — Telehealth: Payer: Self-pay | Admitting: Oncology

## 2020-11-01 ENCOUNTER — Inpatient Hospital Stay (INDEPENDENT_AMBULATORY_CARE_PROVIDER_SITE_OTHER): Payer: 59 | Admitting: Hematology and Oncology

## 2020-11-01 ENCOUNTER — Inpatient Hospital Stay: Payer: 59 | Attending: Hematology and Oncology

## 2020-11-01 ENCOUNTER — Encounter: Payer: Self-pay | Admitting: Hematology and Oncology

## 2020-11-01 ENCOUNTER — Other Ambulatory Visit: Payer: Self-pay

## 2020-11-01 VITALS — BP 185/74 | Temp 98.9°F | Resp 18 | Ht 62.0 in | Wt 178.0 lb

## 2020-11-01 DIAGNOSIS — C20 Malignant neoplasm of rectum: Secondary | ICD-10-CM | POA: Diagnosis not present

## 2020-11-01 LAB — CBC AND DIFFERENTIAL
HCT: 33 — AB (ref 36–46)
Hemoglobin: 11.1 — AB (ref 12.0–16.0)
Neutrophils Absolute: 5.14
Platelets: 175 (ref 150–399)
WBC: 6.5

## 2020-11-01 LAB — BASIC METABOLIC PANEL
BUN: 23 — AB (ref 4–21)
CO2: 27 — AB (ref 13–22)
Chloride: 106 (ref 99–108)
Creatinine: 1.1 (ref 0.5–1.1)
Glucose: 202
Potassium: 4.2 (ref 3.4–5.3)
Sodium: 140 (ref 137–147)

## 2020-11-01 LAB — CBC
MCV: 83 (ref 81–99)
RBC: 4 (ref 3.87–5.11)

## 2020-11-01 LAB — HEPATIC FUNCTION PANEL
ALT: 17 (ref 7–35)
AST: 24 (ref 13–35)
Alkaline Phosphatase: 48 (ref 25–125)
Bilirubin, Total: 0.3

## 2020-11-01 LAB — COMPREHENSIVE METABOLIC PANEL
Albumin: 3.9 (ref 3.5–5.0)
Calcium: 9.5 (ref 8.7–10.7)

## 2020-11-01 NOTE — Telephone Encounter (Signed)
Per 1/19 LOS, Patient scheduled for Feb Appt's.  Gave patient Appt Summary

## 2020-11-01 NOTE — Progress Notes (Signed)
[facility]  [address] [place],  [place]  [address] [phone]  Clinic Day:  11/01/2020  Referring physician: Bonnita Nasuti, MD   CHIEF COMPLAINT:  CC: Rectal cancer  Current Treatment:  Adjuvant FOLFOX every 14 days for 4 months   HISTORY OF PRESENT ILLNESS:  Gloria Hodge is a 55 y.o. female with stage IIB (T3 N0 M0) rectal cancer diagnosed in March 2021.  She was treated with neoadjuvant chemoradiation, which consisted of infusional 5 fluorouracil.  She then underwent low anterior resection of her rectal cancer in September 2021.  She is receiving adjuvant FOLFOX chemotherapy and completed 6 cycles.  She had problems with nausea and vomiting, so we increased her ondansetron to 8 mg twice daily.  INTERVAL HISTORY:  Gloria Hodge is here today prior to a 7th cycle of FOLFOX. She states she tolerated her 6th cycle without significant difficulty.  The nausea and vomiting was controlled with the increased dose of ondansetron. She has chronic constipation due to opioid use for chronic back pain.  This is managed with medications. She denies fevers or chills. She reports tenderness of the umbilicus and states she feels she may have recurrent hernia in the area. Her appetite is good. Her weight has increased 3 pounds over last 2 weeks. She states her blood pressure is always high in the doctor's office, including her primary care office.  REVIEW OF SYSTEMS:  Review of Systems  Constitutional: Negative for appetite change, chills, fatigue, fever and unexpected weight change.  HENT:   Negative for lump/mass, mouth sores and sore throat.   Respiratory: Negative for cough and shortness of breath.   Cardiovascular: Negative for chest pain and leg swelling.  Gastrointestinal: Positive for abdominal pain (tenderness of the umbilicus) and constipation (chronic, managed with medication). Negative for diarrhea, nausea and vomiting.  Genitourinary:  Negative for difficulty urinating, dysuria, frequency and hematuria.   Musculoskeletal: Negative for arthralgias, back pain and myalgias.  Skin: Negative for rash.  Neurological: Negative for dizziness and headaches.  Psychiatric/Behavioral: Negative for depression and sleep disturbance. The patient is not nervous/anxious.     VITALS:  Blood pressure (!) 185/74, temperature 98.9 F (37.2 C), temperature source Oral, resp. rate 18, height 5\' 2"  (1.575 m), weight 178 lb (80.7 kg), SpO2 95 %.  Wt Readings from Last 3 Encounters:  11/01/20 178 lb (80.7 kg)  10/26/20 177 lb 4 oz (80.4 kg)  10/24/20 174 lb 4 oz (79 kg)    Body mass index is 32.56 kg/m.  Performance status (ECOG): 0 - Asymptomatic  PHYSICAL EXAM:  Physical Exam Vitals and nursing note reviewed.  Constitutional:      General: She is not in acute distress.    Appearance: Normal appearance.  HENT:     Head: Normocephalic and atraumatic.     Mouth/Throat:     Mouth: Mucous membranes are moist.     Pharynx: Oropharynx is clear. No oropharyngeal exudate or posterior oropharyngeal erythema.  Eyes:     General: No scleral icterus.    Extraocular Movements: Extraocular movements intact.     Conjunctiva/sclera: Conjunctivae normal.     Pupils: Pupils are equal, round, and reactive to light.  Cardiovascular:     Rate and Rhythm: Normal rate and regular rhythm.     Heart sounds: Normal heart sounds. No murmur heard. No friction rub. No gallop.   Pulmonary:     Effort: Pulmonary effort is normal. No respiratory distress.  Breath sounds: Normal breath sounds. No stridor. No wheezing, rhonchi or rales.  Chest:  Breasts:     Right: No axillary adenopathy or supraclavicular adenopathy.     Left: No axillary adenopathy or supraclavicular adenopathy.    Abdominal:     General: Bowel sounds are normal. There is no distension.     Palpations: Abdomen is soft. There is no hepatomegaly, splenomegaly or mass.     Tenderness:  There is abdominal tenderness. There is no guarding.     Hernia: No hernia is present.     Comments: Tenderness of the umbilicus without obvious hernia  Musculoskeletal:        General: Normal range of motion.     Cervical back: Normal range of motion and neck supple. No tenderness.     Right lower leg: No edema.     Left lower leg: No edema.  Lymphadenopathy:     Cervical: No cervical adenopathy.     Upper Body:     Right upper body: No supraclavicular or axillary adenopathy.     Left upper body: No supraclavicular or axillary adenopathy.     Lower Body: No right inguinal adenopathy. No left inguinal adenopathy.  Skin:    General: Skin is warm and dry.     Coloration: Skin is not jaundiced.     Findings: No rash.  Neurological:     Mental Status: She is alert and oriented to person, place, and time.     Cranial Nerves: No cranial nerve deficit.  Psychiatric:        Mood and Affect: Mood normal.        Behavior: Behavior normal.        Thought Content: Thought content normal.    LABS:   CBC Latest Ref Rng & Units 11/01/2020 10/18/2020 10/02/2020  WBC - 6.5 4.1 5.9  Hemoglobin 12.0 - 16.0 11.1(A) 10.5(A) 10.9(A)  Hematocrit 36 - 46 33(A) 32(A) 33(A)  Platelets 150 - 399 175 268 269   CMP Latest Ref Rng & Units 11/01/2020 10/18/2020 10/02/2020  Glucose 70 - 99 mg/dL - - -  BUN 4 - 21 23(A) 22(A) 19  Creatinine 0.5 - 1.1 1.1 1.1 1.3(A)  Sodium 137 - 147 140 135(A) 141  Potassium 3.4 - 5.3 4.2 4.4 4.0  Chloride 99 - 108 106 106 103  CO2 13 - 22 27(A) 20 26(A)  Calcium 8.7 - 10.7 9.5 8.9 9.0  Alkaline Phos 25 - 125 48 38 51  AST 13 - 35 24 22 21   ALT 7 - 35 17 11 14      No results found for: CEA1 / No results found for: CEA1 No results found for: PSA1 No results found for: [id number] No results found for: [id number]  No results found for: TOTALPROTELP, ALBUMINELP, A1GS, A2GS, BETS, BETA2SER, GAMS, MSPIKE, SPEI No results found for: TIBC, FERRITIN, IRONPCTSAT No results found for:  LDH  STUDIES:  No results found.    HISTORY:   Past Medical History:  Diagnosis Date  . Arthritis    hands  . Calf pain 01/2020  . Depression   . Diabetes ([place])   . Diabetic retinopathy associated with controlled type 2 diabetes mellitus ([place])   . Dizziness   . Fatigue    resolved taking meds for sleep  . Frequent headaches   . IBS (irritable bowel syndrome)    resolved  . Malignant neoplasm of rectum ([place])   . Malignant neoplasm of rectum ([place])   .  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 Muscle pain   . Swelling     Past Surgical History:  Procedure Laterality Date  . ABDOMINAL HYSTERECTOMY    . BLADDER SURGERY    . BREAST SURGERY     reduction  . carpel tunnel surgery     right hand  . EYE SURGERY Left 11/29/2019   for diabetic retinopathy  . FLEXIBLE SIGMOIDOSCOPY  06/21/2020   Procedure: FLEXIBLE SIGMOIDOSCOPY;  Surgeon: Leighton Ruff, MD;  Location: [facility];  Service: General;;  . HERNIA REPAIR    . KNEE SURGERY     left  . TOE AMPUTATION Bilateral 2018-2020   great and 3rd toes  . XI ROBOTIC ASSISTED LOWER ANTERIOR RESECTION N/A 06/21/2020   Procedure: XI ROBOTIC ASSISTED LOWER ANTERIOR RESECTION;  Surgeon: Leighton Ruff, MD;  Location: [facility];  Service: General;  Laterality: N/A;    History reviewed. No pertinent family history.  Social History:  reports that she has quit smoking. She has never used smokeless tobacco. She reports that she does not drink alcohol and does not use drugs.The patient is alone today.  Allergies:  Allergies  Allergen Reactions  . Bactrim [Sulfamethoxazole-Trimethoprim] Rash    Current Medications: Current Outpatient Medications  Medication Sig Dispense Refill  . milk thistle 175 MG tablet Take 175 mg by mouth daily.    Marland Kitchen acetaminophen (TYLENOL) 500 MG tablet Take 1,000 mg by mouth every 6 (six) hours as needed for headache.    . calcium carbonate (TUMS - DOSED IN MG ELEMENTAL CALCIUM) 500 MG chewable tablet Chew 1 tablet by mouth  daily.    . cholecalciferol (VITAMIN D3) 25 MCG (1000 UNIT) tablet Take 1,000 Units by mouth daily.    . Continuous Blood Gluc Receiver (FREESTYLE LIBRE 14 DAY READER) DEVI     . Continuous Blood Gluc Sensor (FREESTYLE LIBRE 14 DAY SENSOR) MISC     . cyclobenzaprine (FLEXERIL) 10 MG tablet Take 10 mg by mouth daily.     . diphenhydrAMINE (BENADRYL) 25 MG tablet Take 75 mg by mouth daily as needed for allergies.    . fenofibrate (TRICOR) 145 MG tablet Take 145 mg by mouth daily.     . furosemide (LASIX) 20 MG tablet Take 40 mg by mouth daily.     Marland Kitchen HUMALOG KWIKPEN 100 UNIT/ML KwikPen Inject into the skin.    Marland Kitchen HUMALOG KWIKPEN 200 UNIT/ML SOPN Inject 20-30 Units into the skin 3 (three) times daily with meals.     . hydrocortisone cream 1 % Apply 1 application topically daily as needed for itching.    Marland Kitchen LINZESS 145 MCG CAPS capsule Take 145 mcg by mouth daily as needed for constipation.    . metFORMIN (GLUCOPHAGE) 1000 MG tablet Take 1,000 mg by mouth 2 (two) times daily.     Marland Kitchen neomycin-polymyxin-hydrocortisone (CORTISPORIN) OTIC solution Apply 2-3 drops to the ingrown toenail site twice daily. Cover with band-aid. (Patient not taking: Reported on 05/31/2020) 10 mL 0  . ondansetron (ZOFRAN) 4 MG tablet Take 1 tablet (4 mg total) by mouth every 4 (four) hours as needed for nausea. 90 tablet 3  . ondansetron (ZOFRAN) 8 MG tablet Take 8 mg by mouth 2 (two) times daily as needed for nausea or vomiting.     Marland Kitchen oxyCODONE (ROXICODONE) 15 MG immediate release tablet Take 15 mg by mouth 3 (three) times daily.     Vladimir Faster Glycol-Propyl Glycol (SYSTANE OP) Place 1 drop into both eyes 2 (  two) times daily.    . Probiotic CHEW Chew 2 tablets by mouth daily as needed (immune support).    . prochlorperazine (COMPAZINE) 10 MG tablet Take 1 tablet (10 mg total) by mouth every 6 (six) hours as needed for nausea or vomiting. 30 tablet 2  . rosuvastatin (CRESTOR) 20 MG tablet Take 20 mg by mouth daily.    .  sertraline (ZOLOFT) 100 MG tablet Take 100 mg by mouth daily.    . simethicone (MYLICON) 253 MG chewable tablet Chew 125 mg by mouth every 6 (six) hours as needed for flatulence.    . TRESIBA FLEXTOUCH 100 UNIT/ML SOPN FlexTouch Pen Inject 60 Units into the skin daily.     Marland Kitchen zolpidem (AMBIEN) 10 MG tablet Take 10 mg by mouth at bedtime.      No current facility-administered medications for this visit.     ASSESSMENT & PLAN:   Assessment/Plan:  Gloria Hodge is a 55 y.o. female with stage IIB rectal cancer.  She will proceed with her 7th cycle of adjuvant FOLFOX chemotherapy next week. Overall, she appears to be doing well.   She has systolic hypertension, which  Is stable We will see this patient back in 2 weeks before she heads into her 8th and final cycle of adjuvant FOLFOX chemotherapy. The patient understands the plans discussed today and is in agreement with them.  She knows to contact our office if she develops concerns prior to her next appointment.   Marvia Pickles, PA-C

## 2020-11-07 ENCOUNTER — Other Ambulatory Visit: Payer: Self-pay

## 2020-11-07 ENCOUNTER — Inpatient Hospital Stay: Payer: 59

## 2020-11-07 VITALS — BP 172/75 | HR 93 | Temp 98.4°F | Resp 20 | Ht 62.0 in | Wt 174.0 lb

## 2020-11-07 DIAGNOSIS — C20 Malignant neoplasm of rectum: Secondary | ICD-10-CM

## 2020-11-07 MED ORDER — FLUOROURACIL CHEMO INJECTION 2.5 GM/50ML
400.0000 mg/m2 | Freq: Once | INTRAVENOUS | Status: AC
  Administered 2020-11-07: 750 mg via INTRAVENOUS
  Filled 2020-11-07: qty 15

## 2020-11-07 MED ORDER — PALONOSETRON HCL INJECTION 0.25 MG/5ML
INTRAVENOUS | Status: AC
  Filled 2020-11-07: qty 5

## 2020-11-07 MED ORDER — LEUCOVORIN CALCIUM INJECTION 100 MG
50.0000 mg | Freq: Once | INTRAMUSCULAR | Status: AC
  Administered 2020-11-07: 50 mg via INTRAVENOUS
  Filled 2020-11-07: qty 2.5

## 2020-11-07 MED ORDER — SODIUM CHLORIDE 0.9 % IV SOLN
150.0000 mg | Freq: Once | INTRAVENOUS | Status: AC
  Administered 2020-11-07: 150 mg via INTRAVENOUS
  Filled 2020-11-07: qty 150

## 2020-11-07 MED ORDER — OXALIPLATIN CHEMO INJECTION 100 MG/20ML
81.0000 mg/m2 | Freq: Once | INTRAVENOUS | Status: AC
  Administered 2020-11-07: 150 mg via INTRAVENOUS
  Filled 2020-11-07: qty 30

## 2020-11-07 MED ORDER — SODIUM CHLORIDE 0.9 % IV SOLN
10.0000 mg | Freq: Once | INTRAVENOUS | Status: AC
  Administered 2020-11-07: 10 mg via INTRAVENOUS
  Filled 2020-11-07: qty 1

## 2020-11-07 MED ORDER — PALONOSETRON HCL INJECTION 0.25 MG/5ML
0.2500 mg | Freq: Once | INTRAVENOUS | Status: AC
  Administered 2020-11-07: 0.25 mg via INTRAVENOUS

## 2020-11-07 MED ORDER — DEXTROSE 5 % IV SOLN
Freq: Once | INTRAVENOUS | Status: AC
  Filled 2020-11-07: qty 250

## 2020-11-07 MED ORDER — SODIUM CHLORIDE 0.9 % IV SOLN
2400.0000 mg/m2 | INTRAVENOUS | Status: DC
  Administered 2020-11-07: 4450 mg via INTRAVENOUS
  Filled 2020-11-07: qty 89

## 2020-11-07 NOTE — Patient Instructions (Signed)
Leucovorin injection What is this medicine? LEUCOVORIN (loo koe VOR in) is used to prevent or treat the harmful effects of some medicines. This medicine is used to treat anemia caused by a low amount of folic acid in the body. It is also used with 5-fluorouracil (5-FU) to treat colon cancer. This medicine may be used for other purposes; ask your health care provider or pharmacist if you have questions. What should I tell my health care provider before I take this medicine? They need to know if you have any of these conditions:  anemia from low levels of vitamin B-12 in the blood  an unusual or allergic reaction to leucovorin, folic acid, other medicines, foods, dyes, or preservatives  pregnant or trying to get pregnant  breast-feeding How should I use this medicine? This medicine is for injection into a muscle or into a vein. It is given by a health care professional in a hospital or clinic setting. Talk to your pediatrician regarding the use of this medicine in children. Special care may be needed. Overdosage: If you think you have taken too much of this medicine contact a poison control center or emergency room at once. NOTE: This medicine is only for you. Do not share this medicine with others. What if I miss a dose? This does not apply. What may interact with this medicine?  capecitabine  fluorouracil  phenobarbital  phenytoin  primidone  trimethoprim-sulfamethoxazole This list may not describe all possible interactions. Give your health care provider a list of all the medicines, herbs, non-prescription drugs, or dietary supplements you use. Also tell them if you smoke, drink alcohol, or use illegal drugs. Some items may interact with your medicine. What should I watch for while using this medicine? Your condition will be monitored carefully while you are receiving this medicine. This medicine may increase the side effects of 5-fluorouracil, 5-FU. Tell your doctor or health  care professional if you have diarrhea or mouth sores that do not get better or that get worse. What side effects may I notice from receiving this medicine? Side effects that you should report to your doctor or health care professional as soon as possible:  allergic reactions like skin rash, itching or hives, swelling of the face, lips, or tongue  breathing problems  fever, infection  mouth sores  unusual bleeding or bruising  unusually weak or tired Side effects that usually do not require medical attention (report to your doctor or health care professional if they continue or are bothersome):  constipation or diarrhea  loss of appetite  nausea, vomiting This list may not describe all possible side effects. Call your doctor for medical advice about side effects. You may report side effects to FDA at 1-800-FDA-1088. Where should I keep my medicine? This drug is given in a hospital or clinic and will not be stored at home. NOTE: This sheet is a summary. It may not cover all possible information. If you have questions about this medicine, talk to your doctor, pharmacist, or health care provider.  2021 Elsevier/Gold Standard (2008-04-05 16:50:29) Fluorouracil, 5-FU injection What is this medicine? FLUOROURACIL, 5-FU (flure oh YOOR a sil) is a chemotherapy drug. It slows the growth of cancer cells. This medicine is used to treat many types of cancer like breast cancer, colon or rectal cancer, pancreatic cancer, and stomach cancer. This medicine may be used for other purposes; ask your health care provider or pharmacist if you have questions. COMMON BRAND NAME(S): Adrucil What should I tell my  health care provider before I take this medicine? They need to know if you have any of these conditions:  blood disorders  dihydropyrimidine dehydrogenase (DPD) deficiency  infection (especially a virus infection such as chickenpox, cold sores, or herpes)  kidney disease  liver  disease  malnourished, poor nutrition  recent or ongoing radiation therapy  an unusual or allergic reaction to fluorouracil, other chemotherapy, other medicines, foods, dyes, or preservatives  pregnant or trying to get pregnant  breast-feeding How should I use this medicine? This drug is given as an infusion or injection into a vein. It is administered in a hospital or clinic by a specially trained health care professional. Talk to your pediatrician regarding the use of this medicine in children. Special care may be needed. Overdosage: If you think you have taken too much of this medicine contact a poison control center or emergency room at once. NOTE: This medicine is only for you. Do not share this medicine with others. What if I miss a dose? It is important not to miss your dose. Call your doctor or health care professional if you are unable to keep an appointment. What may interact with this medicine? Do not take this medicine with any of the following medications:  live virus vaccines This medicine may also interact with the following medications:  medicines that treat or prevent blood clots like warfarin, enoxaparin, and dalteparin This list may not describe all possible interactions. Give your health care provider a list of all the medicines, herbs, non-prescription drugs, or dietary supplements you use. Also tell them if you smoke, drink alcohol, or use illegal drugs. Some items may interact with your medicine. What should I watch for while using this medicine? Visit your doctor for checks on your progress. This drug may make you feel generally unwell. This is not uncommon, as chemotherapy can affect healthy cells as well as cancer cells. Report any side effects. Continue your course of treatment even though you feel ill unless your doctor tells you to stop. In some cases, you may be given additional medicines to help with side effects. Follow all directions for their use. Call  your doctor or health care professional for advice if you get a fever, chills or sore throat, or other symptoms of a cold or flu. Do not treat yourself. This drug decreases your body's ability to fight infections. Try to avoid being around people who are sick. This medicine may increase your risk to bruise or bleed. Call your doctor or health care professional if you notice any unusual bleeding. Be careful brushing and flossing your teeth or using a toothpick because you may get an infection or bleed more easily. If you have any dental work done, tell your dentist you are receiving this medicine. Avoid taking products that contain aspirin, acetaminophen, ibuprofen, naproxen, or ketoprofen unless instructed by your doctor. These medicines may hide a fever. Do not become pregnant while taking this medicine. Women should inform their doctor if they wish to become pregnant or think they might be pregnant. There is a potential for serious side effects to an unborn child. Talk to your health care professional or pharmacist for more information. Do not breast-feed an infant while taking this medicine. Men should inform their doctor if they wish to father a child. This medicine may lower sperm counts. Do not treat diarrhea with over the counter products. Contact your doctor if you have diarrhea that lasts more than 2 days or if it is severe and  watery. This medicine can make you more sensitive to the sun. Keep out of the sun. If you cannot avoid being in the sun, wear protective clothing and use sunscreen. Do not use sun lamps or tanning beds/booths. What side effects may I notice from receiving this medicine? Side effects that you should report to your doctor or health care professional as soon as possible:  allergic reactions like skin rash, itching or hives, swelling of the face, lips, or tongue  low blood counts - this medicine may decrease the number of white blood cells, red blood cells and platelets. You  may be at increased risk for infections and bleeding.  signs of infection - fever or chills, cough, sore throat, pain or difficulty passing urine  signs of decreased platelets or bleeding - bruising, pinpoint red spots on the skin, black, tarry stools, blood in the urine  signs of decreased red blood cells - unusually weak or tired, fainting spells, lightheadedness  breathing problems  changes in vision  chest pain  mouth sores  nausea and vomiting  pain, swelling, redness at site where injected  pain, tingling, numbness in the hands or feet  redness, swelling, or sores on hands or feet  stomach pain  unusual bleeding Side effects that usually do not require medical attention (report to your doctor or health care professional if they continue or are bothersome):  changes in finger or toe nails  diarrhea  dry or itchy skin  hair loss  headache  loss of appetite  sensitivity of eyes to the light  stomach upset  unusually teary eyes This list may not describe all possible side effects. Call your doctor for medical advice about side effects. You may report side effects to FDA at 1-800-FDA-1088. Where should I keep my medicine? This drug is given in a hospital or clinic and will not be stored at home. NOTE: This sheet is a summary. It may not cover all possible information. If you have questions about this medicine, talk to your doctor, pharmacist, or health care provider.  2021 Elsevier/Gold Standard (2019-08-31 15:00:03) Oxaliplatin Injection What is this medicine? OXALIPLATIN (ox AL i PLA tin) is a chemotherapy drug. It targets fast dividing cells, like cancer cells, and causes these cells to die. This medicine is used to treat cancers of the colon and rectum, and many other cancers. This medicine may be used for other purposes; ask your health care provider or pharmacist if you have questions. COMMON BRAND NAME(S): Eloxatin What should I tell my health care  provider before I take this medicine? They need to know if you have any of these conditions:  heart disease  history of irregular heartbeat  liver disease  low blood counts, like white cells, platelets, or red blood cells  lung or breathing disease, like asthma  take medicines that treat or prevent blood clots  tingling of the fingers or toes, or other nerve disorder  an unusual or allergic reaction to oxaliplatin, other chemotherapy, other medicines, foods, dyes, or preservatives  pregnant or trying to get pregnant  breast-feeding How should I use this medicine? This drug is given as an infusion into a vein. It is administered in a hospital or clinic by a specially trained health care professional. Talk to your pediatrician regarding the use of this medicine in children. Special care may be needed. Overdosage: If you think you have taken too much of this medicine contact a poison control center or emergency room at once. NOTE: This medicine  is only for you. Do not share this medicine with others. What if I miss a dose? It is important not to miss a dose. Call your doctor or health care professional if you are unable to keep an appointment. What may interact with this medicine? Do not take this medicine with any of the following medications:  cisapride  dronedarone  pimozide  thioridazine This medicine may also interact with the following medications:  aspirin and aspirin-like medicines  certain medicines that treat or prevent blood clots like warfarin, apixaban, dabigatran, and rivaroxaban  cisplatin  cyclosporine  diuretics  medicines for infection like acyclovir, adefovir, amphotericin B, bacitracin, cidofovir, foscarnet, ganciclovir, gentamicin, pentamidine, vancomycin  NSAIDs, medicines for pain and inflammation, like ibuprofen or naproxen  other medicines that prolong the QT interval (an abnormal heart rhythm)  pamidronate  zoledronic acid This list  may not describe all possible interactions. Give your health care provider a list of all the medicines, herbs, non-prescription drugs, or dietary supplements you use. Also tell them if you smoke, drink alcohol, or use illegal drugs. Some items may interact with your medicine. What should I watch for while using this medicine? Your condition will be monitored carefully while you are receiving this medicine. You may need blood work done while you are taking this medicine. This medicine may make you feel generally unwell. This is not uncommon as chemotherapy can affect healthy cells as well as cancer cells. Report any side effects. Continue your course of treatment even though you feel ill unless your healthcare professional tells you to stop. This medicine can make you more sensitive to cold. Do not drink cold drinks or use ice. Cover exposed skin before coming in contact with cold temperatures or cold objects. When out in cold weather wear warm clothing and cover your mouth and nose to warm the air that goes into your lungs. Tell your doctor if you get sensitive to the cold. Do not become pregnant while taking this medicine or for 9 months after stopping it. Women should inform their health care professional if they wish to become pregnant or think they might be pregnant. Men should not father a child while taking this medicine and for 6 months after stopping it. There is potential for serious side effects to an unborn child. Talk to your health care professional for more information. Do not breast-feed a child while taking this medicine or for 3 months after stopping it. This medicine has caused ovarian failure in some women. This medicine may make it more difficult to get pregnant. Talk to your health care professional if you are concerned about your fertility. This medicine has caused decreased sperm counts in some men. This may make it more difficult to father a child. Talk to your health care  professional if you are concerned about your fertility. This medicine may increase your risk of getting an infection. Call your health care professional for advice if you get a fever, chills, or sore throat, or other symptoms of a cold or flu. Do not treat yourself. Try to avoid being around people who are sick. Avoid taking medicines that contain aspirin, acetaminophen, ibuprofen, naproxen, or ketoprofen unless instructed by your health care professional. These medicines may hide a fever. Be careful brushing or flossing your teeth or using a toothpick because you may get an infection or bleed more easily. If you have any dental work done, tell your dentist you are receiving this medicine. What side effects may I notice from receiving  this medicine? Side effects that you should report to your doctor or health care professional as soon as possible:  allergic reactions like skin rash, itching or hives, swelling of the face, lips, or tongue  breathing problems  cough  low blood counts - this medicine may decrease the number of white blood cells, red blood cells, and platelets. You may be at increased risk for infections and bleeding  nausea, vomiting  pain, redness, or irritation at site where injected  pain, tingling, numbness in the hands or feet  signs and symptoms of bleeding such as bloody or black, tarry stools; red or dark brown urine; spitting up blood or brown material that looks like coffee grounds; red spots on the skin; unusual bruising or bleeding from the eyes, gums, or nose  signs and symptoms of a dangerous change in heartbeat or heart rhythm like chest pain; dizziness; fast, irregular heartbeat; palpitations; feeling faint or lightheaded; falls  signs and symptoms of infection like fever; chills; cough; sore throat; pain or trouble passing urine  signs and symptoms of liver injury like dark yellow or brown urine; general ill feeling or flu-like symptoms; light-colored stools;  loss of appetite; nausea; right upper belly pain; unusually weak or tired; yellowing of the eyes or skin  signs and symptoms of low red blood cells or anemia such as unusually weak or tired; feeling faint or lightheaded; falls  signs and symptoms of muscle injury like dark urine; trouble passing urine or change in the amount of urine; unusually weak or tired; muscle pain; back pain Side effects that usually do not require medical attention (report to your doctor or health care professional if they continue or are bothersome):  changes in taste  diarrhea  gas  hair loss  loss of appetite  mouth sores This list may not describe all possible side effects. Call your doctor for medical advice about side effects. You may report side effects to FDA at 1-800-FDA-1088. Where should I keep my medicine? This drug is given in a hospital or clinic and will not be stored at home. NOTE: This sheet is a summary. It may not cover all possible information. If you have questions about this medicine, talk to your doctor, pharmacist, or health care provider.  2021 Elsevier/Gold Standard (2019-02-17 12:20:35)

## 2020-11-09 ENCOUNTER — Inpatient Hospital Stay: Payer: 59

## 2020-11-09 ENCOUNTER — Other Ambulatory Visit: Payer: Self-pay

## 2020-11-09 VITALS — BP 171/76 | HR 92 | Temp 98.4°F | Resp 18 | Wt 182.5 lb

## 2020-11-09 DIAGNOSIS — C20 Malignant neoplasm of rectum: Secondary | ICD-10-CM | POA: Diagnosis not present

## 2020-11-09 MED ORDER — SODIUM CHLORIDE 0.9% FLUSH
10.0000 mL | INTRAVENOUS | Status: DC | PRN
  Administered 2020-11-09: 10 mL
  Filled 2020-11-09: qty 10

## 2020-11-09 MED ORDER — HEPARIN SOD (PORK) LOCK FLUSH 100 UNIT/ML IV SOLN
500.0000 [IU] | Freq: Once | INTRAVENOUS | Status: AC | PRN
  Administered 2020-11-09: 500 [IU]
  Filled 2020-11-09: qty 5

## 2020-11-09 NOTE — Patient Instructions (Signed)
[facility] [place] Discharge Instructions for Patients Receiving Chemotherapy  Today you received the following chemotherapy agents 5Fluorouracil  To help prevent nausea and vomiting after your treatment, we encourage you to take your nausea medication.   If you develop nausea and vomiting that is not controlled by your nausea medication, call the clinic.   BELOW ARE SYMPTOMS THAT SHOULD BE REPORTED IMMEDIATELY:  *FEVER GREATER THAN 100.5 F  *CHILLS WITH OR WITHOUT FEVER  NAUSEA AND VOMITING THAT IS NOT CONTROLLED WITH YOUR NAUSEA MEDICATION  *UNUSUAL SHORTNESS OF BREATH  *UNUSUAL BRUISING OR BLEEDING  TENDERNESS IN MOUTH AND THROAT WITH OR WITHOUT PRESENCE OF ULCERS  *URINARY PROBLEMS  *BOWEL PROBLEMS  UNUSUAL RASH Items with * indicate a potential emergency and should be followed up as soon as possible.  Feel free to call the clinic should you have any questions or concerns at The clinic phone number is [phone].  Please show the CHEMO ALERT CARD at check-in to the Emergency Department and triage nurse.   

## 2020-11-14 NOTE — Progress Notes (Signed)
[facility]  [address] [place],  [place]  [address] [phone]  Clinic Day:  11/15/2020  Referring physician: Bonnita Nasuti, MD   HISTORY OF PRESENT ILLNESS:  The patient is a 55 y.o. female with stage IIB (T3 N0 M0) rectal cancer, who comes in today to be evaluated before heading into her 8th and final cycle of adjuvant FOLFOX chemotherapy.  The patient claims to have tolerated her 7th cycle of adjuvant FOLFOX chemotherapy fairly well.  With respect to her cancer, she denies having any GI symptoms which concern her for persistent disease.  She is already status post neoadjuvant chemoradiation, which consisted of infusional 5-fluorouracil.  The patient also underwent a low anterior resection of her rectal cancer in early September 2021.    PHYSICAL EXAM:  Blood pressure (!) 196/86, pulse 98, temperature 98.5 F (36.9 C), resp. rate 16, height 5\' 2"  (1.575 m), weight 176 lb 3.2 oz (79.9 kg), SpO2 99 %. Wt Readings from Last 3 Encounters:  11/15/20 176 lb 3.2 oz (79.9 kg)  11/09/20 182 lb 8 oz (82.8 kg)  11/07/20 174 lb (78.9 kg)   Body mass index is 32.23 kg/m. Performance status (ECOG): 1 Physical Exam Constitutional:      Appearance: Normal appearance. She is not ill-appearing.  HENT:     Mouth/Throat:     Mouth: Mucous membranes are moist.     Pharynx: Oropharynx is clear. No oropharyngeal exudate or posterior oropharyngeal erythema.  Cardiovascular:     Rate and Rhythm: Normal rate and regular rhythm.     Heart sounds: No murmur heard. No friction rub. No gallop.   Pulmonary:     Effort: Pulmonary effort is normal. No respiratory distress.     Breath sounds: Normal breath sounds. No wheezing, rhonchi or rales.  Chest:  Breasts:     Right: No axillary adenopathy or supraclavicular adenopathy.     Left: No axillary adenopathy or supraclavicular adenopathy.    Abdominal:     General: Bowel sounds are normal. There is no distension.      Palpations: Abdomen is soft. There is no mass.     Tenderness: There is no abdominal tenderness.  Musculoskeletal:        General: No swelling.     Right lower leg: No edema.     Left lower leg: No edema.  Lymphadenopathy:     Cervical: No cervical adenopathy.     Upper Body:     Right upper body: No supraclavicular or axillary adenopathy.     Left upper body: No supraclavicular or axillary adenopathy.     Lower Body: No right inguinal adenopathy. No left inguinal adenopathy.  Skin:    General: Skin is warm.     Coloration: Skin is not jaundiced.     Findings: No lesion or rash.  Neurological:     General: No focal deficit present.     Mental Status: She is alert and oriented to person, place, and time. Mental status is at baseline.     Cranial Nerves: Cranial nerves are intact.  Psychiatric:        Mood and Affect: Mood normal.        Behavior: Behavior normal.        Thought Content: Thought content normal.     LABS:    Ref. Range 11/15/2020 00:00  Sodium Latest Ref Range: 137 - 147  137  Potassium Latest Ref Range: 3.4 - 5.3  4.0  Chloride Latest Ref Range: 99 - 108  105  CO2 Latest Ref Range: 13 - 22  25 (A)  Glucose Unknown 213  BUN Latest Ref Range: 4 - 21  22 (A)  Creatinine Latest Ref Range: 0.5 - 1.1  1.1  Calcium Latest Ref Range: 8.7 - 10.7  9.4  Alkaline Phosphatase Latest Ref Range: 25 - 125  55  Albumin Latest Ref Range: 3.5 - 5.0  4.2  AST Latest Ref Range: 13 - 35  24  ALT Latest Ref Range: 7 - 35  16  Bilirubin, Total Unknown 0.3  WBC Unknown 4.7  RBC Latest Ref Range: 3.87 - 5.11  3.68 (A)  Hemoglobin Latest Ref Range: 12.0 - 16.0  10.4 (A)  HCT Latest Ref Range: 36 - 46  31 (A)  MCV Latest Ref Range: 81 - 99  83  Platelets Latest Ref Range: 150 - 399  241  NEUT# Unknown 3.34    ASSESSMENT & PLAN:  Assessment/Plan:  A 55 y.o. female with stage IIB rectal cancer.  She will proceed with her 8th and final cycle of adjuvant FOLFOX chemotherapy  this week. Clinically, she appears to be doing well.  I will see this patient back in 6 weeks for repeat clinical assessment.  CT scans will be done a day before her next visit to ascertain her new disease baseline after the completion of all of her definitive therapy.  The patient understands all the plans discussed today and is in agreement with them.    Amere Bricco Macarthur Critchley, MD

## 2020-11-15 ENCOUNTER — Telehealth: Payer: Self-pay | Admitting: Oncology

## 2020-11-15 ENCOUNTER — Other Ambulatory Visit: Payer: Self-pay | Admitting: Oncology

## 2020-11-15 ENCOUNTER — Inpatient Hospital Stay (INDEPENDENT_AMBULATORY_CARE_PROVIDER_SITE_OTHER): Payer: 59 | Admitting: Oncology

## 2020-11-15 ENCOUNTER — Other Ambulatory Visit: Payer: Self-pay

## 2020-11-15 ENCOUNTER — Inpatient Hospital Stay: Payer: 59 | Attending: Oncology

## 2020-11-15 ENCOUNTER — Other Ambulatory Visit: Payer: Self-pay | Admitting: Hematology and Oncology

## 2020-11-15 VITALS — BP 196/86 | HR 98 | Temp 98.5°F | Resp 16 | Ht 62.0 in | Wt 176.2 lb

## 2020-11-15 DIAGNOSIS — C20 Malignant neoplasm of rectum: Secondary | ICD-10-CM | POA: Insufficient documentation

## 2020-11-15 LAB — HEPATIC FUNCTION PANEL
ALT: 16 (ref 7–35)
AST: 24 (ref 13–35)
Alkaline Phosphatase: 55 (ref 25–125)
Bilirubin, Total: 0.3

## 2020-11-15 LAB — CBC AND DIFFERENTIAL
HCT: 31 — AB (ref 36–46)
Hemoglobin: 10.4 — AB (ref 12.0–16.0)
Neutrophils Absolute: 3.34
Platelets: 241 (ref 150–399)
WBC: 4.7

## 2020-11-15 LAB — BASIC METABOLIC PANEL
BUN: 22 — AB (ref 4–21)
CO2: 25 — AB (ref 13–22)
Chloride: 105 (ref 99–108)
Creatinine: 1.1 (ref 0.5–1.1)
Glucose: 213
Potassium: 4 (ref 3.4–5.3)
Sodium: 137 (ref 137–147)

## 2020-11-15 LAB — CBC
MCV: 83 (ref 81–99)
RBC: 3.68 — AB (ref 3.87–5.11)

## 2020-11-15 LAB — COMPREHENSIVE METABOLIC PANEL
Albumin: 4.2 (ref 3.5–5.0)
Calcium: 9.4 (ref 8.7–10.7)

## 2020-11-15 NOTE — Telephone Encounter (Signed)
2/2 Next appt sched and given to patient

## 2020-11-20 ENCOUNTER — Ambulatory Visit: Payer: 59 | Admitting: Podiatry

## 2020-11-21 ENCOUNTER — Other Ambulatory Visit: Payer: Self-pay

## 2020-11-21 ENCOUNTER — Inpatient Hospital Stay: Payer: 59 | Attending: Hematology and Oncology

## 2020-11-21 VITALS — BP 141/78 | HR 103 | Temp 99.0°F | Resp 20 | Ht 62.0 in | Wt 173.0 lb

## 2020-11-21 DIAGNOSIS — C20 Malignant neoplasm of rectum: Secondary | ICD-10-CM

## 2020-11-21 DIAGNOSIS — Z5111 Encounter for antineoplastic chemotherapy: Secondary | ICD-10-CM | POA: Insufficient documentation

## 2020-11-21 MED ORDER — PALONOSETRON HCL INJECTION 0.25 MG/5ML
INTRAVENOUS | Status: AC
  Filled 2020-11-21: qty 5

## 2020-11-21 MED ORDER — SODIUM CHLORIDE 0.9 % IV SOLN
2400.0000 mg/m2 | INTRAVENOUS | Status: DC
  Administered 2020-11-21: 4450 mg via INTRAVENOUS
  Filled 2020-11-21: qty 89

## 2020-11-21 MED ORDER — PALONOSETRON HCL INJECTION 0.25 MG/5ML
0.2500 mg | Freq: Once | INTRAVENOUS | Status: AC
  Administered 2020-11-21: 0.25 mg via INTRAVENOUS

## 2020-11-21 MED ORDER — OXALIPLATIN CHEMO INJECTION 100 MG/20ML
81.0000 mg/m2 | Freq: Once | INTRAVENOUS | Status: AC
  Administered 2020-11-21: 150 mg via INTRAVENOUS
  Filled 2020-11-21: qty 30

## 2020-11-21 MED ORDER — LEUCOVORIN CALCIUM INJECTION 100 MG
50.0000 mg | Freq: Once | INTRAMUSCULAR | Status: AC
  Administered 2020-11-21: 50 mg via INTRAVENOUS
  Filled 2020-11-21: qty 2.5

## 2020-11-21 MED ORDER — SODIUM CHLORIDE 0.9 % IV SOLN
10.0000 mg | Freq: Once | INTRAVENOUS | Status: AC
  Administered 2020-11-21: 10 mg via INTRAVENOUS
  Filled 2020-11-21: qty 10

## 2020-11-21 MED ORDER — SODIUM CHLORIDE 0.9 % IV SOLN
150.0000 mg | Freq: Once | INTRAVENOUS | Status: AC
  Administered 2020-11-21: 150 mg via INTRAVENOUS
  Filled 2020-11-21: qty 150

## 2020-11-21 MED ORDER — FLUOROURACIL CHEMO INJECTION 2.5 GM/50ML
400.0000 mg/m2 | Freq: Once | INTRAVENOUS | Status: AC
  Administered 2020-11-21: 750 mg via INTRAVENOUS
  Filled 2020-11-21: qty 15

## 2020-11-21 MED ORDER — DEXTROSE 5 % IV SOLN
Freq: Once | INTRAVENOUS | Status: AC
Start: 2020-11-21 — End: 2020-11-21
  Filled 2020-11-21: qty 250

## 2020-11-21 NOTE — Patient Instructions (Signed)
[facility] Discharge Instructions for Patients Receiving Chemotherapy  Today you received the following chemotherapy agents 15fluorouracil, oxaliplatin, leucovorin  To help prevent nausea and vomiting after your treatment, we encourage you to take your nausea medication.   If you develop nausea and vomiting that is not controlled by your nausea medication, call the clinic.   BELOW ARE SYMPTOMS THAT SHOULD BE REPORTED IMMEDIATELY:  *FEVER GREATER THAN 100.5 F  *CHILLS WITH OR WITHOUT FEVER  NAUSEA AND VOMITING THAT IS NOT CONTROLLED WITH YOUR NAUSEA MEDICATION  *UNUSUAL SHORTNESS OF BREATH  *UNUSUAL BRUISING OR BLEEDING  TENDERNESS IN MOUTH AND THROAT WITH OR WITHOUT PRESENCE OF ULCERS  *URINARY PROBLEMS  *BOWEL PROBLEMS  UNUSUAL RASH Items with * indicate a potential emergency and should be followed up as soon as possible.  Feel free to call the clinic should you have any questions or concerns at The clinic phone number is [phone].  Please show the [place] at check-in to the Emergency Department and triage nurse.

## 2020-11-23 ENCOUNTER — Other Ambulatory Visit: Payer: Self-pay

## 2020-11-23 ENCOUNTER — Inpatient Hospital Stay: Payer: 59

## 2020-11-23 VITALS — BP 182/86 | HR 100 | Temp 98.6°F | Resp 18 | Wt 176.0 lb

## 2020-11-23 DIAGNOSIS — C20 Malignant neoplasm of rectum: Secondary | ICD-10-CM

## 2020-11-23 MED ORDER — SODIUM CHLORIDE 0.9% FLUSH
10.0000 mL | INTRAVENOUS | Status: DC | PRN
  Administered 2020-11-23: 10 mL
  Filled 2020-11-23: qty 10

## 2020-11-23 MED ORDER — HEPARIN SOD (PORK) LOCK FLUSH 100 UNIT/ML IV SOLN
500.0000 [IU] | Freq: Once | INTRAVENOUS | Status: AC | PRN
  Administered 2020-11-23: 500 [IU]
  Filled 2020-11-23: qty 5

## 2020-11-23 NOTE — Patient Instructions (Signed)
Fluorouracil, 5-FU injection What is this medicine? FLUOROURACIL, 5-FU (flure oh YOOR a sil) is a chemotherapy drug. It slows the growth of cancer cells. This medicine is used to treat many types of cancer like breast cancer, colon or rectal cancer, pancreatic cancer, and stomach cancer. This medicine may be used for other purposes; ask your health care provider or pharmacist if you have questions. COMMON BRAND NAME(S): Adrucil What should I tell my health care provider before I take this medicine? They need to know if you have any of these conditions:  blood disorders  dihydropyrimidine dehydrogenase (DPD) deficiency  infection (especially a virus infection such as chickenpox, cold sores, or herpes)  kidney disease  liver disease  malnourished, poor nutrition  recent or ongoing radiation therapy  an unusual or allergic reaction to fluorouracil, other chemotherapy, other medicines, foods, dyes, or preservatives  pregnant or trying to get pregnant  breast-feeding How should I use this medicine? This drug is given as an infusion or injection into a vein. It is administered in a hospital or clinic by a specially trained health care professional. Talk to your pediatrician regarding the use of this medicine in children. Special care may be needed. Overdosage: If you think you have taken too much of this medicine contact a poison control center or emergency room at once. NOTE: This medicine is only for you. Do not share this medicine with others. What if I miss a dose? It is important not to miss your dose. Call your doctor or health care professional if you are unable to keep an appointment. What may interact with this medicine? Do not take this medicine with any of the following medications:  live virus vaccines This medicine may also interact with the following medications:  medicines that treat or prevent blood clots like warfarin, enoxaparin, and dalteparin This list may not  describe all possible interactions. Give your health care provider a list of all the medicines, herbs, non-prescription drugs, or dietary supplements you use. Also tell them if you smoke, drink alcohol, or use illegal drugs. Some items may interact with your medicine. What should I watch for while using this medicine? Visit your doctor for checks on your progress. This drug may make you feel generally unwell. This is not uncommon, as chemotherapy can affect healthy cells as well as cancer cells. Report any side effects. Continue your course of treatment even though you feel ill unless your doctor tells you to stop. In some cases, you may be given additional medicines to help with side effects. Follow all directions for their use. Call your doctor or health care professional for advice if you get a fever, chills or sore throat, or other symptoms of a cold or flu. Do not treat yourself. This drug decreases your body's ability to fight infections. Try to avoid being around people who are sick. This medicine may increase your risk to bruise or bleed. Call your doctor or health care professional if you notice any unusual bleeding. Be careful brushing and flossing your teeth or using a toothpick because you may get an infection or bleed more easily. If you have any dental work done, tell your dentist you are receiving this medicine. Avoid taking products that contain aspirin, acetaminophen, ibuprofen, naproxen, or ketoprofen unless instructed by your doctor. These medicines may hide a fever. Do not become pregnant while taking this medicine. Women should inform their doctor if they wish to become pregnant or think they might be pregnant. There is a potential   for serious side effects to an unborn child. Talk to your health care professional or pharmacist for more information. Do not breast-feed an infant while taking this medicine. Men should inform their doctor if they wish to father a child. This medicine may  lower sperm counts. Do not treat diarrhea with over the counter products. Contact your doctor if you have diarrhea that lasts more than 2 days or if it is severe and watery. This medicine can make you more sensitive to the sun. Keep out of the sun. If you cannot avoid being in the sun, wear protective clothing and use sunscreen. Do not use sun lamps or tanning beds/booths. What side effects may I notice from receiving this medicine? Side effects that you should report to your doctor or health care professional as soon as possible:  allergic reactions like skin rash, itching or hives, swelling of the face, lips, or tongue  low blood counts - this medicine may decrease the number of white blood cells, red blood cells and platelets. You may be at increased risk for infections and bleeding.  signs of infection - fever or chills, cough, sore throat, pain or difficulty passing urine  signs of decreased platelets or bleeding - bruising, pinpoint red spots on the skin, black, tarry stools, blood in the urine  signs of decreased red blood cells - unusually weak or tired, fainting spells, lightheadedness  breathing problems  changes in vision  chest pain  mouth sores  nausea and vomiting  pain, swelling, redness at site where injected  pain, tingling, numbness in the hands or feet  redness, swelling, or sores on hands or feet  stomach pain  unusual bleeding Side effects that usually do not require medical attention (report to your doctor or health care professional if they continue or are bothersome):  changes in finger or toe nails  diarrhea  dry or itchy skin  hair loss  headache  loss of appetite  sensitivity of eyes to the light  stomach upset  unusually teary eyes This list may not describe all possible side effects. Call your doctor for medical advice about side effects. You may report side effects to FDA at 1-800-FDA-1088. Where should I keep my medicine? This  drug is given in a hospital or clinic and will not be stored at home. NOTE: This sheet is a summary. It may not cover all possible information. If you have questions about this medicine, talk to your doctor, pharmacist, or health care provider.  2021 Elsevier/Gold Standard (2019-08-31 15:00:03)

## 2020-12-11 ENCOUNTER — Other Ambulatory Visit: Payer: Self-pay

## 2020-12-11 ENCOUNTER — Other Ambulatory Visit: Payer: Self-pay | Admitting: Podiatry

## 2020-12-11 ENCOUNTER — Ambulatory Visit (INDEPENDENT_AMBULATORY_CARE_PROVIDER_SITE_OTHER): Payer: 59

## 2020-12-11 ENCOUNTER — Ambulatory Visit (INDEPENDENT_AMBULATORY_CARE_PROVIDER_SITE_OTHER): Payer: 59 | Admitting: Podiatry

## 2020-12-11 DIAGNOSIS — L97521 Non-pressure chronic ulcer of other part of left foot limited to breakdown of skin: Secondary | ICD-10-CM

## 2020-12-11 DIAGNOSIS — M8580 Other specified disorders of bone density and structure, unspecified site: Secondary | ICD-10-CM

## 2020-12-11 DIAGNOSIS — L03031 Cellulitis of right toe: Secondary | ICD-10-CM | POA: Diagnosis not present

## 2020-12-11 DIAGNOSIS — M86671 Other chronic osteomyelitis, right ankle and foot: Secondary | ICD-10-CM | POA: Diagnosis not present

## 2020-12-11 MED ORDER — CLINDAMYCIN HCL 300 MG PO CAPS: 300.0000 mg | ORAL_CAPSULE | Freq: Two times a day (BID) | ORAL | 0 refills | Status: DC

## 2020-12-11 NOTE — Progress Notes (Signed)
  Subjective:  Patient ID: Gloria Hodge, female    DOB: [birth year],  MRN: [id number]  Chief Complaint  Patient presents with  . toe check    F/U Rt 3rd toe check -pt states," it's worse.": -w/ swelling and bleeding -no redness Tx: neosporin     55 y.o. female presents for wound care. Hx confirmed with patient. States she has been picking at the area despite being told multiple times not to do so. Objective:  Physical Exam: Right 3rd toe with ulceration at distal aspect of the nail plate with probe to bone. Evidence or prior cellulitis, no active warmth noted.  No other open wounds Assessment:   1. Cellulitis of third toe, right   2. Ulcer of toe, left, limited to breakdown of skin ([name])   3. Bone erosion   4. Chronic osteomyelitis of toe, right ([name])    Plan:  Patient was evaluated and treated and all questions answered.  Right 3rd toe ulcer -XR reviewed. Distal osteolysis noted of 3rd toe -Rx clindamycin -Patient has failed all conservative therapy and wishes to proceed with surgical intervention. All risks, benefits, and alternatives discussed with patient. No guarantees given. Consent reviewed and signed by patient. -Planned procedures: right 3rd toe partial amputation -ASA 3 - Patient with moderate systemic disease with functional limitations; Risk factors: DM, obesity -Post-op anticoagulation: chemoprophylaxis not indicated   No follow-ups on file.

## 2020-12-19 ENCOUNTER — Telehealth: Payer: Self-pay | Admitting: Urology

## 2020-12-19 NOTE — Telephone Encounter (Signed)
DOS: 12/21/20  Partial Amputation right 3rd toe--28825   Spoke with Angela Nevin with [facility] and stated that prior auth isnt needed for CPT 28825. Ref# [id number]  Spoke with automated system with [facility] and prior auth isnt needed for CPT 28825. Ref # [id number]

## 2020-12-21 ENCOUNTER — Encounter: Payer: Self-pay | Admitting: Podiatry

## 2020-12-21 DIAGNOSIS — M86671 Other chronic osteomyelitis, right ankle and foot: Secondary | ICD-10-CM | POA: Diagnosis not present

## 2020-12-21 DIAGNOSIS — L97521 Non-pressure chronic ulcer of other part of left foot limited to breakdown of skin: Secondary | ICD-10-CM | POA: Diagnosis not present

## 2020-12-26 ENCOUNTER — Telehealth: Payer: Self-pay | Admitting: Oncology

## 2020-12-26 NOTE — Telephone Encounter (Signed)
12/26/20 Spoke with patient and scheduled ct scans on 01/03/21

## 2020-12-28 ENCOUNTER — Other Ambulatory Visit: Payer: Medicare Other

## 2020-12-28 ENCOUNTER — Ambulatory Visit: Payer: Medicare Other | Admitting: Oncology

## 2020-12-28 ENCOUNTER — Encounter: Payer: 59 | Admitting: Podiatry

## 2021-01-01 ENCOUNTER — Encounter: Payer: 59 | Admitting: Podiatry

## 2021-01-03 ENCOUNTER — Telehealth: Payer: Self-pay | Admitting: Oncology

## 2021-01-03 ENCOUNTER — Encounter: Payer: Self-pay | Admitting: Oncology

## 2021-01-03 NOTE — Progress Notes (Signed)
[facility]  [address] [place],  [place]  [address] [phone]  Clinic Day:  01/04/2021  Referring physician: Bonnita Nasuti, MD   HISTORY OF PRESENT ILLNESS:  The patient is a 55 y.o. female with stage IIB (T3 N0 M0) rectal cancer, who comes in today to go over her CT scans to ascertain her new disease baseline after completing all of her definitive therapy.  This initially consisted of chemoradiation, followed by a low anterior resection, followed by adjuvant FOLFOX  chemotherapy.  With respect to her cancer, she denies having any GI symptoms which concern her for persistent disease.   PHYSICAL EXAM:  Blood pressure (!) 195/82, pulse 83, temperature 98.5 F (36.9 C), resp. rate 16, height 5\' 2"  (1.575 m), weight 172 lb 3.2 oz (78.1 kg), SpO2 100 %. Wt Readings from Last 3 Encounters:  01/04/21 172 lb 3.2 oz (78.1 kg)  11/23/20 176 lb (79.8 kg)  11/21/20 173 lb 0.1 oz (78.5 kg)   Body mass index is 31.5 kg/m. Performance status (ECOG): 1 Physical Exam Constitutional:      Appearance: Normal appearance. She is not ill-appearing.  HENT:     Mouth/Throat:     Mouth: Mucous membranes are moist.     Pharynx: Oropharynx is clear. No oropharyngeal exudate or posterior oropharyngeal erythema.  Cardiovascular:     Rate and Rhythm: Normal rate and regular rhythm.     Heart sounds: No murmur heard. No friction rub. No gallop.   Pulmonary:     Effort: Pulmonary effort is normal. No respiratory distress.     Breath sounds: Normal breath sounds. No wheezing, rhonchi or rales.  Chest:  Breasts:     Right: No axillary adenopathy or supraclavicular adenopathy.     Left: No axillary adenopathy or supraclavicular adenopathy.    Abdominal:     General: Bowel sounds are normal. There is no distension.     Palpations: Abdomen is soft. There is no mass.     Tenderness: There is no abdominal tenderness.  Musculoskeletal:        General: No swelling.      Right lower leg: No edema.     Left lower leg: No edema.  Lymphadenopathy:     Cervical: No cervical adenopathy.     Upper Body:     Right upper body: No supraclavicular or axillary adenopathy.     Left upper body: No supraclavicular or axillary adenopathy.     Lower Body: No right inguinal adenopathy. No left inguinal adenopathy.  Skin:    General: Skin is warm.     Coloration: Skin is not jaundiced.     Findings: No lesion or rash.  Neurological:     General: No focal deficit present.     Mental Status: She is alert and oriented to person, place, and time. Mental status is at baseline.     Cranial Nerves: Cranial nerves are intact.  Psychiatric:        Mood and Affect: Mood normal.        Behavior: Behavior normal.        Thought Content: Thought content normal.   SCANS:  CT scans of her abdomen/pelvis revealed the following: FINDINGS: Lower chest: The heart is normal in size. No pericardial effusion. The distal esophagus and distal descending thoracic aorta are unremarkable.  Small stable pulmonary nodules are noted at both lung bases. These measure less than 4 mm. No new nodules. No acute pulmonary findings.  No pleural effusion.  Hepatobiliary: No hepatic lesions or intrahepatic biliary dilatation. The gallbladder is unremarkable. The portal and hepatic veins are patent.  Pancreas: No mass, inflammation or ductal dilatation.  Spleen: Normal size.  No focal lesions.  Adrenals/Urinary Tract: The adrenal glands and kidneys are unremarkable. No renal lesions or hydronephrosis. The bladder is unremarkable.  Stomach/Bowel: The stomach, duodenum and small bowel are unremarkable. No acute inflammatory changes, mass lesions or obstructive findings. The terminal ileum is normal. Appendix is normal.  Moderate to large amount of stool throughout the colon and down into the rectum suggesting constipation. Stable surgical changes involving the rectosigmoid junction area.  No findings suspicious for recurrent tumor.  Vascular/Lymphatic: The aorta is normal in caliber. No dissection. The branch vessels are patent. The major venous structures are patent. No mesenteric or retroperitoneal mass or adenopathy. Small scattered lymph nodes are noted.  Reproductive: The uterus is surgically absent. Both ovaries are still present and appear normal.  Other: No significant free abdominal/free pelvic fluid collections. There is stable presacral/perirectal postoperative changes but no findings for recurrent pelvic tumor and no pelvic adenopathy or inguinal adenopathy. Stable cystic area in the right inguinal region likely iliopsoas bursitis.  Musculoskeletal: No significant bony findings.  IMPRESSION: 1. Stable surgical changes involving the rectosigmoid junction area. No findings suspicious for recurrent tumor, regional lymphadenopathy or metastatic disease. 2. Moderate to large amount of stool throughout the colon and down into the rectum suggesting constipation. 3. Stable small pulmonary nodules at both lung bases. 4. Stable cystic area in the right inguinal region likely iliopsoas bursitis.  LABS:       ASSESSMENT & PLAN:  Assessment/Plan:  A 55 y.o. female with stage IIB rectal cancer, status post the completion of all of her definitive therapy.  In clinic today, I went over all of her scans with her, for which she could see she is disease free.  I am also pleased with her CEA level, which is normal at 3.0.  Moving forward, I will follow her with physical exams and CEA levels every 4 months for these first few years to ensure she remains disease free.  She will also undergo a colonoscopy in the next 6 months to ensure there remains no other sites of disease in her lower GI tract. The patient understands all the plans discussed today and is in agreement with them.    Malarie Tappen Macarthur Critchley, MD

## 2021-01-03 NOTE — Telephone Encounter (Signed)
Patient called to verify 3/24 Labs, Follow Up Appt's

## 2021-01-04 ENCOUNTER — Other Ambulatory Visit: Payer: Self-pay | Admitting: Oncology

## 2021-01-04 ENCOUNTER — Other Ambulatory Visit: Payer: Self-pay

## 2021-01-04 ENCOUNTER — Inpatient Hospital Stay: Payer: 59 | Attending: Oncology | Admitting: Oncology

## 2021-01-04 ENCOUNTER — Inpatient Hospital Stay: Payer: 59

## 2021-01-04 ENCOUNTER — Telehealth: Payer: Self-pay | Admitting: Oncology

## 2021-01-04 ENCOUNTER — Ambulatory Visit (INDEPENDENT_AMBULATORY_CARE_PROVIDER_SITE_OTHER): Payer: 59 | Admitting: Podiatry

## 2021-01-04 VITALS — BP 195/82 | HR 83 | Temp 98.5°F | Resp 16 | Ht 62.0 in | Wt 172.2 lb

## 2021-01-04 DIAGNOSIS — L03031 Cellulitis of right toe: Secondary | ICD-10-CM | POA: Diagnosis not present

## 2021-01-04 DIAGNOSIS — C20 Malignant neoplasm of rectum: Secondary | ICD-10-CM

## 2021-01-04 DIAGNOSIS — L97521 Non-pressure chronic ulcer of other part of left foot limited to breakdown of skin: Secondary | ICD-10-CM

## 2021-01-04 NOTE — Telephone Encounter (Signed)
Per 3/24 los next appt scheduled and given to patient 

## 2021-01-08 NOTE — Progress Notes (Signed)
  Subjective:  Patient ID: Gloria Hodge, female    DOB: [birth year],  MRN: [id number]  No chief complaint on file.  DOS: 12/21/20 Procedure: Partial amputation right 3rd toe  55 y.o. female presents with the above complaint. History confirmed with patient. Doing well thinks the toe is healing fine, denies other issues.  Objective:  Physical Exam: no tenderness at the surgical site and calf supple, nontender. Incision: healing well, no significant drainage, no dehiscence, no significant erythema  Assessment:   1. Cellulitis of third toe, right   2. Ulcer of toe, left, limited to breakdown of skin [facility])     Plan:  Patient was evaluated and treated and all questions answered.  Post-operative State -Dressing applied consisting of betadine, band-aid, sterile gauze, kerlix and ACE bandage -WBAT in Surgical shoe  Return in about 2 weeks (around 01/18/2021) for Post-Op (No XRs).

## 2021-01-11 ENCOUNTER — Encounter: Payer: 59 | Admitting: Podiatry

## 2021-01-15 NOTE — Telephone Encounter (Signed)
ERROR

## 2021-01-18 ENCOUNTER — Ambulatory Visit (INDEPENDENT_AMBULATORY_CARE_PROVIDER_SITE_OTHER): Payer: 59 | Admitting: Podiatry

## 2021-01-18 ENCOUNTER — Other Ambulatory Visit: Payer: Self-pay

## 2021-01-18 DIAGNOSIS — L03031 Cellulitis of right toe: Secondary | ICD-10-CM

## 2021-01-18 DIAGNOSIS — L97521 Non-pressure chronic ulcer of other part of left foot limited to breakdown of skin: Secondary | ICD-10-CM | POA: Diagnosis not present

## 2021-01-18 NOTE — Progress Notes (Signed)
  Subjective:  Patient ID: Gloria Hodge, female    DOB: 02-23-1966,  MRN: [id number]  Chief Complaint  Patient presents with  . Routine Post Op    POV -pt denies N/V/F/Ch -pt states,: doing good." -pt denies complaints -pt denies redness/swelling Tx: none FBS: 156 a1C: 8   DOS: 12/21/20 Procedure: Partial amputation right 3rd toe  55 y.o. female presents with the above complaint. History confirmed with patient.   Objective:  Physical Exam: no tenderness at the surgical site and calf supple, nontender. Incision: appears healed at incision, small granular fissure plantar aspect of digit.  Assessment:   1. Cellulitis of third toe, right   2. Ulcer of toe, left, limited to breakdown of skin [facility])     Plan:  Patient was evaluated and treated and all questions answered.  Post-operative State -Abx ointment and band-aid applied -Ok to return to normal shoe. -Advised not to pick or scratch at the area.  No follow-ups on file.

## 2021-02-15 ENCOUNTER — Ambulatory Visit: Payer: 59 | Admitting: Podiatry

## 2021-05-03 ENCOUNTER — Other Ambulatory Visit: Payer: Self-pay

## 2021-05-03 ENCOUNTER — Other Ambulatory Visit: Payer: Self-pay | Admitting: Hematology and Oncology

## 2021-05-03 DIAGNOSIS — R110 Nausea: Secondary | ICD-10-CM

## 2021-05-03 NOTE — Progress Notes (Signed)
error 

## 2021-05-03 NOTE — Telephone Encounter (Addendum)
Pt notified that Vida Roller will only refill this Zofran once, then future refills will need to be handled by PCP. She will also call & make appt with her PCP to discuss her BP and lasix refills.

## 2021-05-04 ENCOUNTER — Inpatient Hospital Stay: Payer: 59 | Attending: Oncology

## 2021-05-04 ENCOUNTER — Other Ambulatory Visit: Payer: Self-pay

## 2021-05-04 ENCOUNTER — Ambulatory Visit: Payer: Medicare Other | Admitting: Oncology

## 2021-05-04 ENCOUNTER — Encounter: Payer: Self-pay | Admitting: Hematology and Oncology

## 2021-05-04 DIAGNOSIS — C20 Malignant neoplasm of rectum: Secondary | ICD-10-CM | POA: Diagnosis present

## 2021-05-04 LAB — COMPREHENSIVE METABOLIC PANEL
Albumin: 3.8 (ref 3.5–5.0)
Calcium: 8.9 (ref 8.7–10.7)

## 2021-05-04 LAB — BASIC METABOLIC PANEL
BUN: 31 — AB (ref 4–21)
CO2: 22 (ref 13–22)
Chloride: 104 (ref 99–108)
Creatinine: 1.3 — AB (ref 0.5–1.1)
Glucose: 252
Potassium: 4.2 (ref 3.4–5.3)
Sodium: 137 (ref 137–147)

## 2021-05-04 LAB — HEPATIC FUNCTION PANEL
ALT: 16 (ref 7–35)
AST: 28 (ref 13–35)
Alkaline Phosphatase: 45 (ref 25–125)
Bilirubin, Total: 0.3

## 2021-05-04 LAB — CBC AND DIFFERENTIAL
HCT: 36 (ref 36–46)
Hemoglobin: 11.6 — AB (ref 12.0–16.0)
Neutrophils Absolute: 5.18
Platelets: 275 (ref 150–399)
WBC: 6.9

## 2021-05-04 LAB — CBC: RBC: 4.4 (ref 3.87–5.11)

## 2021-05-05 LAB — CEA: CEA: 2.3 ng/mL (ref 0.0–4.7)

## 2021-05-07 ENCOUNTER — Ambulatory Visit: Payer: Medicare Other | Admitting: Oncology

## 2021-05-08 NOTE — Progress Notes (Signed)
[facility]  [address] [place],  [place]  [address] [phone]  Clinic Day:  05/21/2021  Referring physician: Bonnita Nasuti, MD  This document serves as a record of services personally performed by Gloria Potter, MD. It was created on their behalf by Curry,Lauren E, a trained medical scribe. The creation of this record is based on the scribe's personal observations and the provider's statements to them.  HISTORY OF PRESENT ILLNESS:  The patient is a 55 y.o. female with stage IIB (T3 N0 M0) rectal cancer, who comes in today for routine follow-up.  Her definitive therapy consisted of chemoradiation, followed by a low anterior resection, followed by adjuvant FOLFOX  chemotherapy - all of which was completed in February 2022.  CT scans done after the completion of all of her definitive therapy showed complete disease resolution.  With respect to her cancer, she denies having any GI symptoms which concern her for disease recurrence.    PHYSICAL EXAM:  Blood pressure (!) 189/77, pulse 99, temperature 98.7 F (37.1 C), resp. rate 16, height '5\' 2"'$  (1.575 m), weight 182 lb 11.2 oz (82.9 kg), SpO2 99 %. Wt Readings from Last 3 Encounters:  05/21/21 182 lb 11.2 oz (82.9 kg)  01/04/21 172 lb 3.2 oz (78.1 kg)  11/23/20 176 lb (79.8 kg)   Body mass index is 33.42 kg/m. Performance status (ECOG): 1 Physical Exam Constitutional:      Appearance: Normal appearance. She is not ill-appearing.  HENT:     Mouth/Throat:     Mouth: Mucous membranes are moist.     Pharynx: Oropharynx is clear. No oropharyngeal exudate or posterior oropharyngeal erythema.  Cardiovascular:     Rate and Rhythm: Normal rate and regular rhythm.     Heart sounds: No murmur heard.   No friction rub. No gallop.  Pulmonary:     Effort: Pulmonary effort is normal. No respiratory distress.     Breath sounds: Normal breath sounds. No wheezing, rhonchi or rales.  Chest:  Breasts:     Right: No axillary adenopathy or supraclavicular adenopathy.     Left: No axillary adenopathy or supraclavicular adenopathy.  Abdominal:     General: Bowel sounds are normal. There is no distension.     Palpations: Abdomen is soft. There is no mass.     Tenderness: There is no abdominal tenderness.  Musculoskeletal:        General: No swelling.     Right lower leg: No edema.     Left lower leg: No edema.  Lymphadenopathy:     Cervical: No cervical adenopathy.     Upper Body:     Right upper body: No supraclavicular or axillary adenopathy.     Left upper body: No supraclavicular or axillary adenopathy.     Lower Body: No right inguinal adenopathy. No left inguinal adenopathy.  Skin:    General: Skin is warm.     Coloration: Skin is not jaundiced.     Findings: No lesion or rash.  Neurological:     General: No focal deficit present.     Mental Status: She is alert and oriented to person, place, and time. Mental status is at baseline.     Cranial Nerves: Cranial nerves are intact.  Psychiatric:        Mood and Affect: Mood normal.        Behavior: Behavior normal.        Thought Content: Thought content normal.  LABS:    Ref. Range 05/04/2021 15:23  CEA Latest Ref Range: 0.0 - 4.7 ng/mL 2.3       ASSESSMENT & PLAN:  Assessment/Plan:  A 55 y.o. female with stage IIB rectal cancer, status post the completion of all of her definitive therapy.  I am pleased with her CEA level remains normal.   I will continue to follow her with physical exams and CEA levels every 4 months for these first few years to ensure she remains disease free.  She is scheduled for a colonoscopy in December 2022 to ensure there remains no new sites of disease in her lower GI tract. The patient understands all the plans discussed today and is in agreement with them.     I, Rita Ohara, am acting as scribe for Gloria Potter, MD    I have reviewed this report as typed by the medical scribe, and it is  complete and accurate.  Gloria Terwilliger Macarthur Critchley, MD

## 2021-05-09 ENCOUNTER — Encounter: Payer: Self-pay | Admitting: Oncology

## 2021-05-10 ENCOUNTER — Other Ambulatory Visit: Payer: Self-pay

## 2021-05-10 ENCOUNTER — Ambulatory Visit (INDEPENDENT_AMBULATORY_CARE_PROVIDER_SITE_OTHER): Payer: 59 | Admitting: Podiatry

## 2021-05-10 ENCOUNTER — Encounter: Payer: Self-pay | Admitting: Podiatry

## 2021-05-10 DIAGNOSIS — E119 Type 2 diabetes mellitus without complications: Secondary | ICD-10-CM | POA: Insufficient documentation

## 2021-05-10 DIAGNOSIS — R1310 Dysphagia, unspecified: Secondary | ICD-10-CM | POA: Insufficient documentation

## 2021-05-10 DIAGNOSIS — E782 Mixed hyperlipidemia: Secondary | ICD-10-CM | POA: Insufficient documentation

## 2021-05-10 DIAGNOSIS — M150 Primary generalized (osteo)arthritis: Secondary | ICD-10-CM | POA: Insufficient documentation

## 2021-05-10 DIAGNOSIS — L84 Corns and callosities: Secondary | ICD-10-CM

## 2021-05-10 DIAGNOSIS — L97512 Non-pressure chronic ulcer of other part of right foot with fat layer exposed: Secondary | ICD-10-CM | POA: Insufficient documentation

## 2021-05-10 DIAGNOSIS — Z89431 Acquired absence of right foot: Secondary | ICD-10-CM | POA: Insufficient documentation

## 2021-05-10 DIAGNOSIS — I10 Essential (primary) hypertension: Secondary | ICD-10-CM | POA: Insufficient documentation

## 2021-05-10 DIAGNOSIS — B351 Tinea unguium: Secondary | ICD-10-CM | POA: Diagnosis not present

## 2021-05-10 DIAGNOSIS — F411 Generalized anxiety disorder: Secondary | ICD-10-CM | POA: Insufficient documentation

## 2021-05-10 DIAGNOSIS — Z79899 Other long term (current) drug therapy: Secondary | ICD-10-CM | POA: Insufficient documentation

## 2021-05-10 DIAGNOSIS — E1142 Type 2 diabetes mellitus with diabetic polyneuropathy: Secondary | ICD-10-CM | POA: Diagnosis not present

## 2021-05-10 DIAGNOSIS — D518 Other vitamin B12 deficiency anemias: Secondary | ICD-10-CM | POA: Insufficient documentation

## 2021-05-10 DIAGNOSIS — E1121 Type 2 diabetes mellitus with diabetic nephropathy: Secondary | ICD-10-CM | POA: Insufficient documentation

## 2021-05-10 DIAGNOSIS — E039 Hypothyroidism, unspecified: Secondary | ICD-10-CM | POA: Insufficient documentation

## 2021-05-10 DIAGNOSIS — K5900 Constipation, unspecified: Secondary | ICD-10-CM | POA: Insufficient documentation

## 2021-05-10 DIAGNOSIS — E559 Vitamin D deficiency, unspecified: Secondary | ICD-10-CM | POA: Insufficient documentation

## 2021-05-10 DIAGNOSIS — F332 Major depressive disorder, recurrent severe without psychotic features: Secondary | ICD-10-CM | POA: Insufficient documentation

## 2021-05-10 DIAGNOSIS — Z713 Dietary counseling and surveillance: Secondary | ICD-10-CM | POA: Insufficient documentation

## 2021-05-10 DIAGNOSIS — F5105 Insomnia due to other mental disorder: Secondary | ICD-10-CM | POA: Insufficient documentation

## 2021-05-10 DIAGNOSIS — E1165 Type 2 diabetes mellitus with hyperglycemia: Secondary | ICD-10-CM | POA: Insufficient documentation

## 2021-05-10 DIAGNOSIS — M545 Low back pain, unspecified: Secondary | ICD-10-CM | POA: Insufficient documentation

## 2021-05-10 DIAGNOSIS — L03032 Cellulitis of left toe: Secondary | ICD-10-CM | POA: Insufficient documentation

## 2021-05-10 DIAGNOSIS — M5412 Radiculopathy, cervical region: Secondary | ICD-10-CM | POA: Insufficient documentation

## 2021-05-10 NOTE — Progress Notes (Signed)
  Subjective:  Patient ID: Gloria Hodge, female    DOB: 09/05/1966,  MRN: [id number]  Chief Complaint  Patient presents with   Callouses    I have some calluses that I need to have trimmed     55 y.o. female presents with the above complaint. History confirmed with patient.   Objective:  Physical Exam: warm, good capillary refill, nail exam onychomycosis of the toenails, no trophic changes or ulcerative lesions. DP pulses palpable, PT pulses palpable, and protective sensation absent. 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 HPK plantar 1st bilat, medial 1st met right, bilat lateral heels  No images are attached to the encounter.  Assessment:   1. Onychomycosis   2. DM type 2 with diabetic peripheral neuropathy (HCC)   3. Callus      Plan:  Patient was evaluated and treated and all questions answered.  Onychomycosis, Diabetes, and DPN -Patient is diabetic with a qualifying condition for at risk foot care.  Procedure: Nail Debridement Type of Debridement: manual, sharp debridement. Instrumentation: Nail nipper, rotary burr. Number of Nails: 3   Procedure: Paring of Lesion Rationale: painful hyperkeratotic lesion Type of Debridement: manual, sharp debridement. Instrumentation: 312 blade Number of Lesions: 5  Return in about 3 months (around 08/10/2021) for Diabetic Foot Care.

## 2021-05-18 ENCOUNTER — Telehealth: Payer: Self-pay | Admitting: Oncology

## 2021-05-18 NOTE — Telephone Encounter (Signed)
Patient called to verify 8/8 Appt

## 2021-05-21 ENCOUNTER — Other Ambulatory Visit: Payer: Self-pay | Admitting: Oncology

## 2021-05-21 ENCOUNTER — Inpatient Hospital Stay: Payer: 59 | Attending: Oncology | Admitting: Oncology

## 2021-05-21 ENCOUNTER — Other Ambulatory Visit: Payer: Self-pay

## 2021-05-21 VITALS — BP 189/77 | HR 99 | Temp 98.7°F | Resp 16 | Ht 62.0 in | Wt 182.7 lb

## 2021-05-21 DIAGNOSIS — C20 Malignant neoplasm of rectum: Secondary | ICD-10-CM

## 2021-06-22 ENCOUNTER — Encounter: Payer: Self-pay | Admitting: Oncology

## 2021-08-13 ENCOUNTER — Encounter: Payer: Self-pay | Admitting: Oncology

## 2021-08-13 ENCOUNTER — Ambulatory Visit (INDEPENDENT_AMBULATORY_CARE_PROVIDER_SITE_OTHER): Payer: Medicare Other | Admitting: Podiatry

## 2021-08-13 ENCOUNTER — Other Ambulatory Visit: Payer: Self-pay

## 2021-08-13 DIAGNOSIS — E1142 Type 2 diabetes mellitus with diabetic polyneuropathy: Secondary | ICD-10-CM

## 2021-08-13 DIAGNOSIS — B351 Tinea unguium: Secondary | ICD-10-CM | POA: Diagnosis not present

## 2021-08-13 NOTE — Progress Notes (Signed)
  Subjective:  Patient ID: Gloria Hodge, female    DOB: 12/07/1965,  MRN: [id number]  Chief Complaint  Patient presents with   debride    DFC -FBS: 166 A1C: 8 CPP: [place] x 10-21    55 y.o. female presents with the above complaint. History confirmed with patient.   Objective:  Physical Exam: warm, good capillary refill, nail exam onychomycosis of the toenails, no trophic changes or ulcerative lesions. DP pulses palpable, PT pulses palpable, and protective sensation absent. 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  No images are attached to the encounter.  Assessment:   1. Onychomycosis   2. DM type 2 with diabetic peripheral neuropathy ([place])    Plan:  Patient was evaluated and treated and all questions answered.  Onychomycosis, Diabetes, and DPN -Patient is diabetic with a qualifying condition for at risk foot care.   Procedure: Nail Debridement Type of Debridement: manual, sharp debridement. Instrumentation: Nail nipper, rotary burr. Number of Nails: 3    Return in about 3 months (around 11/13/2021) for Diabetic Foot Care.

## 2021-09-13 NOTE — Progress Notes (Signed)
[facility]  [address] [place],  [place]  [address] [phone]  Clinic Day:  09/20/2021  Referring physician: Bonnita Nasuti, MD  This document serves as a record of services personally performed by Marice Potter, MD. It was created on their behalf by Curry,Lauren E, a trained medical scribe. The creation of this record is based on the scribe's personal observations and the provider's statements to them.  HISTORY OF PRESENT ILLNESS:  The patient is a 55 y.o. female with stage IIB (T3 N0 M0) rectal cancer, who comes in today for routine follow-up.  Her definitive therapy consisted of chemoradiation, followed by a low anterior resection, followed by adjuvant FOLFOX  chemotherapy - all of which was completed in February 2022.  CT scans done after the completion of all of her definitive therapy showed complete disease resolution.  Since her last visit, the patient has been doing well.  With respect to her cancer, she denies having any GI symptoms which concern her for disease recurrence.    PHYSICAL EXAM:  Blood pressure (!) 173/74, pulse 95, temperature 98.5 F (36.9 C), resp. rate 16, height 5\' 2"  (1.575 m), weight 187 lb 3.2 oz (84.9 kg), SpO2 98 %. Wt Readings from Last 3 Encounters:  09/20/21 187 lb 3.2 oz (84.9 kg)  05/21/21 182 lb 11.2 oz (82.9 kg)  01/04/21 172 lb 3.2 oz (78.1 kg)   Body mass index is 34.24 kg/m. Performance status (ECOG): 1 Physical Exam Constitutional:      Appearance: Normal appearance. She is not ill-appearing.  HENT:     Mouth/Throat:     Mouth: Mucous membranes are moist.     Pharynx: Oropharynx is clear. No oropharyngeal exudate or posterior oropharyngeal erythema.  Cardiovascular:     Rate and Rhythm: Normal rate and regular rhythm.     Heart sounds: No murmur heard.   No friction rub. No gallop.  Pulmonary:     Effort: Pulmonary effort is normal. No respiratory distress.     Breath sounds: Normal breath  sounds. No wheezing, rhonchi or rales.  Abdominal:     General: Bowel sounds are normal. There is no distension.     Palpations: Abdomen is soft. There is no mass.     Tenderness: There is no abdominal tenderness.  Musculoskeletal:        General: No swelling.     Right lower leg: No edema.     Left lower leg: No edema.  Lymphadenopathy:     Cervical: No cervical adenopathy.     Upper Body:     Right upper body: No supraclavicular or axillary adenopathy.     Left upper body: No supraclavicular or axillary adenopathy.     Lower Body: No right inguinal adenopathy. No left inguinal adenopathy.  Skin:    General: Skin is warm.     Coloration: Skin is not jaundiced.     Findings: No lesion or rash.  Neurological:     General: No focal deficit present.     Mental Status: She is alert and oriented to person, place, and time. Mental status is at baseline.  Psychiatric:        Mood and Affect: Mood normal.        Behavior: Behavior normal.        Thought Content: Thought content normal.   LABS:    Latest Reference Range & Units 05/04/21 15:23 09/19/21 14:45  CEA 0.0 - 4.7 ng/mL 2.3 2.1  ASSESSMENT & PLAN:  Assessment/Plan:  A 55 y.o. female with stage IIB rectal cancer, status post the completion of all of her definitive therapy.  I am pleased that her physical exam and CEA level remain normal.  Clinically, the patient is doing very well.  I will continue to follow her with physical exams and CEA levels every 4 months to ensure she remains disease free.  Of note, she is scheduled for a colonoscopy in the forthcoming weeks.  I will see her back in 4 months for repeat clinical assessment.  The patient understands all the plans discussed today and is in agreement with them.     I, Rita Ohara, am acting as scribe for Marice Potter, MD    I have reviewed this report as typed by the medical scribe, and it is complete and accurate.  Ketty Bitton Macarthur Critchley, MD

## 2021-09-19 ENCOUNTER — Inpatient Hospital Stay: Payer: Medicare Other | Attending: Oncology

## 2021-09-19 ENCOUNTER — Other Ambulatory Visit: Payer: Self-pay | Admitting: Hematology and Oncology

## 2021-09-19 DIAGNOSIS — C20 Malignant neoplasm of rectum: Secondary | ICD-10-CM | POA: Diagnosis present

## 2021-09-19 LAB — HEPATIC FUNCTION PANEL
ALT: 33 (ref 7–35)
AST: 32 (ref 13–35)
Alkaline Phosphatase: 49 (ref 25–125)
Bilirubin, Total: 0.3

## 2021-09-19 LAB — CBC AND DIFFERENTIAL
HCT: 33 — AB (ref 36–46)
Hemoglobin: 10.9 — AB (ref 12.0–16.0)
Neutrophils Absolute: 5.4
Platelets: 315 (ref 150–399)
WBC: 7.3

## 2021-09-19 LAB — MAGNESIUM: Magnesium: 1.7

## 2021-09-19 LAB — BASIC METABOLIC PANEL
BUN: 17 (ref 4–21)
CO2: 23 — AB (ref 13–22)
Chloride: 106 (ref 99–108)
Creatinine: 1.2 — AB (ref 0.5–1.1)
Glucose: 123
Potassium: 4 (ref 3.4–5.3)
Sodium: 140 (ref 137–147)

## 2021-09-19 LAB — COMPREHENSIVE METABOLIC PANEL
Albumin: 3.9 (ref 3.5–5.0)
Calcium: 9.1 (ref 8.7–10.7)

## 2021-09-19 LAB — CBC: RBC: 4 (ref 3.87–5.11)

## 2021-09-20 ENCOUNTER — Inpatient Hospital Stay (INDEPENDENT_AMBULATORY_CARE_PROVIDER_SITE_OTHER): Payer: Medicare Other | Admitting: Oncology

## 2021-09-20 VITALS — BP 173/74 | HR 95 | Temp 98.5°F | Resp 16 | Ht 62.0 in | Wt 187.2 lb

## 2021-09-20 DIAGNOSIS — C20 Malignant neoplasm of rectum: Secondary | ICD-10-CM

## 2021-09-21 LAB — CEA: CEA: 2.1 ng/mL (ref 0.0–4.7)

## 2021-09-23 ENCOUNTER — Encounter: Payer: Self-pay | Admitting: Oncology

## 2021-09-24 ENCOUNTER — Telehealth: Payer: Self-pay

## 2021-09-24 NOTE — Telephone Encounter (Signed)
Pt notified of below. She verbalized understanding.  LABS:      Latest Reference Range & Units 05/04/21 15:23 09/19/21 14:45  CEA 0.0 - 4.7 ng/mL 2.3 2.1    ASSESSMENT & PLAN:  Assessment/Plan:  A 55 y.o. female with stage IIB rectal cancer, status post the completion of all of her definitive therapy.  I am pleased that her physical exam and CEA level remain normal.  Clinically, the patient is doing very well.  I will continue to follow her with physical exams and CEA levels every 4 months to ensure she remains disease free.  Of note, she is scheduled for a colonoscopy in the forthcoming weeks.  I will see her back in 4 months for repeat clinical assessment.  The patient understands all the plans discussed today and is in agreement with them.       I, Rita Ohara, am acting as scribe for Marice Potter, MD     I have reviewed this report as typed by the medical scribe, and it is complete and accurate.   Marice Potter, MD Electronically signed by Marice Potter, MD at 09/23/2021  7:10 PM

## 2021-09-26 ENCOUNTER — Encounter: Payer: Self-pay | Admitting: Oncology

## 2021-10-14 HISTORY — PX: PORT-A-CATH REMOVAL: SHX5289

## 2021-11-12 ENCOUNTER — Other Ambulatory Visit: Payer: Self-pay | Admitting: *Deleted

## 2021-11-12 ENCOUNTER — Encounter: Payer: Self-pay | Admitting: Podiatry

## 2021-11-12 ENCOUNTER — Ambulatory Visit (INDEPENDENT_AMBULATORY_CARE_PROVIDER_SITE_OTHER): Payer: Medicare Other | Admitting: Podiatry

## 2021-11-12 DIAGNOSIS — E1142 Type 2 diabetes mellitus with diabetic polyneuropathy: Secondary | ICD-10-CM

## 2021-11-12 DIAGNOSIS — B351 Tinea unguium: Secondary | ICD-10-CM | POA: Diagnosis not present

## 2021-11-12 DIAGNOSIS — E042 Nontoxic multinodular goiter: Secondary | ICD-10-CM | POA: Insufficient documentation

## 2021-11-12 NOTE — Progress Notes (Signed)
°  Subjective:  Patient ID: Gloria Hodge, female    DOB: 10-11-1966,  MRN: [id number]  Chief Complaint  Patient presents with   Nail Problem    Trim nails    56 y.o. female presents with the above complaint. History confirmed with patient. Reports worsening pain from neuropathy to the feet wondering if there are treatments for this.  [name], [name], MD last seen today. Last AM BS 65 (but 101 at office). Last A1c 7.8  Objective:  Physical Exam: warm, good capillary refill, nail exam onychomycosis of the toenails, no trophic changes or ulcerative lesions. DP pulses palpable, PT pulses palpable, and protective sensation absent. 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  No images are attached to the encounter.  Assessment:   1. Onychomycosis   2. DM type 2 with diabetic peripheral neuropathy ([place])    Plan:  Patient was evaluated and treated and all questions answered.  Onychomycosis, Diabetes, and DPN -Patient is diabetic with a qualifying condition for at risk foot care. -Discussed symptomatic relief of neuropathy with good glycemic control, gabapentin but no cure of neuropathy  Procedure: Nail Debridement Type of Debridement: manual, sharp debridement. Instrumentation: Nail nipper, rotary burr. Number of Nails: 3   No follow-ups on file.

## 2021-11-15 ENCOUNTER — Ambulatory Visit: Payer: Medicare Other | Admitting: Podiatry

## 2022-01-18 ENCOUNTER — Inpatient Hospital Stay: Payer: Medicare Other | Attending: Oncology

## 2022-01-18 DIAGNOSIS — C20 Malignant neoplasm of rectum: Secondary | ICD-10-CM

## 2022-01-18 DIAGNOSIS — Z85048 Personal history of other malignant neoplasm of rectum, rectosigmoid junction, and anus: Secondary | ICD-10-CM | POA: Diagnosis present

## 2022-01-19 LAB — CEA: CEA: 1.5 ng/mL (ref 0.0–4.7)

## 2022-01-20 NOTE — Progress Notes (Signed)
?[facility]  ?[address] ?[place],  [place]  [address] ?[phone] ? ?Clinic Day:  01/21/2022 ? ?Referring physician: Bonnita Nasuti, MD ? ?HISTORY OF PRESENT ILLNESS:  ?The patient is a 56 y.o. female with stage IIB (T3 N0 M0) rectal cancer, who comes in today for routine follow-up.  Her definitive therapy consisted of chemoradiation, followed by a low anterior resection, followed by adjuvant FOLFOX  chemotherapy - all of which was completed in February 2022.  CT scans done after the completion of all of her definitive therapy showed complete disease resolution.  Since her last visit, the patient has been doing well.  With respect to her cancer, she denies having any GI symptoms which concern her for disease recurrence.  ?  ?PHYSICAL EXAM:  ?Blood pressure (!) 142/70, pulse 93, temperature 98.3 ?F (36.8 ?C), resp. rate 16, height '5\' 2"'$  (1.575 m), weight 153 lb 4.8 oz (69.5 kg), SpO2 100 %. ?Wt Readings from Last 3 Encounters:  ?01/21/22 153 lb 4.8 oz (69.5 kg)  ?09/20/21 187 lb 3.2 oz (84.9 kg)  ?05/21/21 182 lb 11.2 oz (82.9 kg)  ? ?Body mass index is 28.04 kg/m?Marland Kitchen ?Performance status (ECOG): 1 ?Physical Exam ?Constitutional:   ?   Appearance: Normal appearance. She is not ill-appearing.  ?HENT:  ?   Mouth/Throat:  ?   Mouth: Mucous membranes are moist.  ?   Pharynx: Oropharynx is clear. No oropharyngeal exudate or posterior oropharyngeal erythema.  ?Cardiovascular:  ?   Rate and Rhythm: Normal rate and regular rhythm.  ?   Heart sounds: No murmur heard. ?  No friction rub. No gallop.  ?Pulmonary:  ?   Effort: Pulmonary effort is normal. No respiratory distress.  ?   Breath sounds: Normal breath sounds. No wheezing, rhonchi or rales.  ?Abdominal:  ?   General: Bowel sounds are normal. There is no distension.  ?   Palpations: Abdomen is soft. There is no mass.  ?   Tenderness: There is no abdominal tenderness.  ?Musculoskeletal:     ?   General: No swelling.  ?   Right lower  leg: No edema.  ?   Left lower leg: No edema.  ?Lymphadenopathy:  ?   Cervical: No cervical adenopathy.  ?   Upper Body:  ?   Right upper body: No supraclavicular or axillary adenopathy.  ?   Left upper body: No supraclavicular or axillary adenopathy.  ?   Lower Body: No right inguinal adenopathy. No left inguinal adenopathy.  ?Skin: ?   General: Skin is warm.  ?   Coloration: Skin is not jaundiced.  ?   Findings: No lesion or rash.  ?Neurological:  ?   General: No focal deficit present.  ?   Mental Status: She is alert and oriented to person, place, and time. Mental status is at baseline.  ?Psychiatric:     ?   Mood and Affect: Mood normal.     ?   Behavior: Behavior normal.     ?   Thought Content: Thought content normal.  ? ?LABS:  ? ? Latest Reference Range & Units 09/19/21 14:45 01/18/22 14:29  ?CEA 0.0 - 4.7 ng/mL 2.1 1.5  ? ? ? ? ? ?ASSESSMENT & PLAN:  ?Assessment/Plan:  A 56 y.o. female with stage IIB rectal cancer, status post the completion of all of her definitive therapy.  I am pleased that her physical exam and CEA level remain normal.  Clinically, the patient  is doing very well.  I will continue to follow her with physical exams and CEA levels every 4 months to ensure she remains disease free.  The patient understands all the plans discussed today and is in agreement with them.   ? ? ?I, Rita Ohara, am acting as scribe for Marice Potter, MD   ? ?I have reviewed this report as typed by the medical scribe, and it is complete and accurate. ? ?Darlin Stenseth Macarthur Critchley, MD ? ? ? ?  ?

## 2022-01-21 ENCOUNTER — Inpatient Hospital Stay: Payer: Medicare Other | Admitting: Oncology

## 2022-01-21 VITALS — BP 142/70 | HR 93 | Temp 98.3°F | Resp 16 | Ht 62.0 in | Wt 153.3 lb

## 2022-01-21 DIAGNOSIS — C20 Malignant neoplasm of rectum: Secondary | ICD-10-CM

## 2022-02-14 ENCOUNTER — Encounter: Payer: Self-pay | Admitting: Podiatry

## 2022-02-14 ENCOUNTER — Ambulatory Visit (INDEPENDENT_AMBULATORY_CARE_PROVIDER_SITE_OTHER): Payer: Medicare Other | Admitting: Podiatry

## 2022-02-14 ENCOUNTER — Other Ambulatory Visit: Payer: Self-pay | Admitting: *Deleted

## 2022-02-14 ENCOUNTER — Ambulatory Visit: Payer: Medicare Other | Admitting: Podiatry

## 2022-02-14 DIAGNOSIS — E119 Type 2 diabetes mellitus without complications: Secondary | ICD-10-CM

## 2022-02-14 DIAGNOSIS — B351 Tinea unguium: Secondary | ICD-10-CM | POA: Diagnosis not present

## 2022-02-14 DIAGNOSIS — E1142 Type 2 diabetes mellitus with diabetic polyneuropathy: Secondary | ICD-10-CM

## 2022-02-14 DIAGNOSIS — Z89421 Acquired absence of other right toe(s): Secondary | ICD-10-CM | POA: Diagnosis not present

## 2022-02-14 DIAGNOSIS — Z89412 Acquired absence of left great toe: Secondary | ICD-10-CM

## 2022-02-14 DIAGNOSIS — Z89411 Acquired absence of right great toe: Secondary | ICD-10-CM

## 2022-02-14 DIAGNOSIS — Z89422 Acquired absence of other left toe(s): Secondary | ICD-10-CM

## 2022-02-14 NOTE — Patient Instructions (Signed)
It was my pleasure serving you today! You had your annual diabetic foot examination performed today. You will notice a charge for an office visit on today's billing and this is for your annual diabetic foot exam. It is done once yearly.  ? ?Diabetes Mellitus and Foot Care ?Foot care is an important part of your health, especially when you have diabetes. Diabetes may cause you to have problems because of poor blood flow (circulation) to your feet and legs, which can cause your skin to: ?Become thinner and drier. ?Break more easily. ?Heal more slowly. ?Peel and crack. ?You may also have nerve damage (neuropathy) in your legs and feet, causing decreased feeling in them. This means that you may not notice minor injuries to your feet that could lead to more serious problems. Noticing and addressing any potential problems early is the best way to prevent future foot problems. ?How to care for your feet ?Foot hygiene ? ?Wash your feet daily with warm water and mild soap. Do not use hot water. Then, pat your feet and the areas between your toes until they are completely dry. Do not soak your feet as this can dry your skin. ?Trim your toenails straight across. Do not dig under them or around the cuticle. File the edges of your nails with an emery board or nail file. ?Apply a moisturizing lotion or petroleum jelly to the skin on your feet and to dry, brittle toenails. Use lotion that does not contain alcohol and is unscented. Do not apply lotion between your toes. ?Shoes and socks ?Wear clean socks or stockings every day. Make sure they are not too tight. Do not wear knee-high stockings since they may decrease blood flow to your legs. ?Wear shoes that fit properly and have enough cushioning. Always look in your shoes before you put them on to be sure there are no objects inside. ?To break in new shoes, wear them for just a few hours a day. This prevents injuries on your feet. ?Wounds, scrapes, corns, and calluses ? ?Check  your feet daily for blisters, cuts, bruises, sores, and redness. If you cannot see the bottom of your feet, use a mirror or ask someone for help. ?Do not cut corns or calluses or try to remove them with medicine. ?If you find a minor scrape, cut, or break in the skin on your feet, keep it and the skin around it clean and dry. You may clean these areas with mild soap and water. Do not clean the area with peroxide, alcohol, or iodine. ?If you have a wound, scrape, corn, or callus on your foot, look at it several times a day to make sure it is healing and not infected. Check for: ?Redness, swelling, or pain. ?Fluid or blood. ?Warmth. ?Pus or a bad smell. ?General tips ?Do not cross your legs. This may decrease blood flow to your feet. ?Do not use heating pads or hot water bottles on your feet. They may burn your skin. If you have lost feeling in your feet or legs, you may not know this is happening until it is too late. ?Protect your feet from hot and cold by wearing shoes, such as at the beach or on hot pavement. ?Schedule a complete foot exam at least once a year (annually) or more often if you have foot problems. Report any cuts, sores, or bruises to your health care provider immediately. ?Where to find more information ?American Diabetes Association: www.diabetes.org ?Association of Diabetes Care & Education Specialists: www.diabeteseducator.org ?Contact  a health care provider if: ?You have a medical condition that increases your risk of infection and you have any cuts, sores, or bruises on your feet. ?You have an injury that is not healing. ?You have redness on your legs or feet. ?You feel burning or tingling in your legs or feet. ?You have pain or cramps in your legs and feet. ?Your legs or feet are numb. ?Your feet always feel cold. ?You have pain around any toenails. ?Get help right away if: ?You have a wound, scrape, corn, or callus on your foot and: ?You have pain, swelling, or redness that gets worse. ?You  have fluid or blood coming from the wound, scrape, corn, or callus. ?Your wound, scrape, corn, or callus feels warm to the touch. ?You have pus or a bad smell coming from the wound, scrape, corn, or callus. ?You have a fever. ?You have a red line going up your leg. ?Summary ?Check your feet every day for blisters, cuts, bruises, sores, and redness. ?Apply a moisturizing lotion or petroleum jelly to the skin on your feet and to dry, brittle toenails. ?Wear shoes that fit properly and have enough cushioning. ?If you have foot problems, report any cuts, sores, or bruises to your health care provider immediately. ?Schedule a complete foot exam at least once a year (annually) or more often if you have foot problems. ?This information is not intended to replace advice given to you by your health care provider. Make sure you discuss any questions you have with your health care provider. ?Document Revised: 04/20/2020 Document Reviewed: 04/20/2020 ?Elsevier Patient Education ? [place]. ? ?

## 2022-02-24 DIAGNOSIS — K760 Fatty (change of) liver, not elsewhere classified: Secondary | ICD-10-CM | POA: Insufficient documentation

## 2022-02-24 DIAGNOSIS — M1610 Unilateral primary osteoarthritis, unspecified hip: Secondary | ICD-10-CM | POA: Insufficient documentation

## 2022-02-24 DIAGNOSIS — E1142 Type 2 diabetes mellitus with diabetic polyneuropathy: Secondary | ICD-10-CM | POA: Insufficient documentation

## 2022-02-24 NOTE — Progress Notes (Signed)
ANNUAL DIABETIC FOOT EXAM ? ?Subjective: ?Gloria Hodge presents today for annual diabetic foot examination. ? ?Patient relates 56 year h/o diabetes. ? ?Patient denies any h/o foot wounds. ? ?Patient has h/o amputation bilateral great toes and bilateral 4th toes and partial amputations of left 2nd and bilateral 3rd digits. ? ?Patient has been diagnosed with neuropathy and it is managed with gabapentin. ? ?Patient's blood sugar was 143 mg/dl today. Last known  HgA1c was 5.5%  ? ?Risk factors: diabetes, diabetic neuropathy, HTN, hyperlipidemia, diabetic renal disease, h/o tobacco use in remission. ? ?[name], [name], MD is patient's PCP. Last visit was Feb 13, 2022. ? ?Past Medical History:  ?Diagnosis Date  ? Arthritis   ? hands  ? Calf pain 01/2020  ? Depression   ? Diabetes ([place])   ? Diabetic retinopathy associated with controlled type 2 diabetes mellitus ([place])   ? Dizziness   ? Fatigue   ? resolved taking meds for sleep  ? Frequent headaches   ? IBS (irritable bowel syndrome)   ? resolved  ? Malignant neoplasm of rectum ([facility])   ? Malignant neoplasm of rectum ([facility])   ? Memory loss   ? from chemo  ? Muscle pain   ? Swelling   ? ?Patient Active Problem List  ? Diagnosis Date Noted  ? Fatty liver 02/24/2022  ? Polyneuropathy due to type 2 diabetes mellitus ([place]) 02/24/2022  ? Primary localized osteoarthritis of pelvic region and thigh 02/24/2022  ? Non-toxic multinodular goiter 11/12/2021  ? Acquired absence of right foot ([place]) 05/10/2021  ? Cellulitis of toe of left foot 05/10/2021  ? Cervical radiculopathy 05/10/2021  ? Constipation 05/10/2021  ? Diabetic renal disease ([place]) 05/10/2021  ? Dysphagia 05/10/2021  ? Dietary counseling and surveillance 05/10/2021  ? Essential hypertension 05/10/2021  ? Generalized anxiety disorder 05/10/2021  ? Hyperglycemia due to type 2 diabetes mellitus ([place]) 05/10/2021  ? Hypothyroidism 05/10/2021  ? Insomnia due to other mental disorder ([place]) 05/10/2021  ? Low back pain  05/10/2021  ? Mixed hyperlipidemia 05/10/2021  ? Non-pressure chronic ulcer of other part of right foot with fat layer exposed ([place]) 05/10/2021  ? Other long term (current) drug therapy 05/10/2021  ? Other vitamin B12 deficiency anemias 05/10/2021  ? Primary generalized (osteo)arthritis 05/10/2021  ? Severe recurrent major depression without psychotic features ([place]) 05/10/2021  ? Type 2 diabetes mellitus without complications ([place]) 83/66/2947  ? Vitamin D deficiency 05/10/2021  ? Rectal cancer ([place]) 06/21/2020  ? History of amputation of lesser toe of both feet ([place]) 10/26/2019  ? Onychomycosis 10/26/2019  ? ?Past Surgical History:  ?Procedure Laterality Date  ? ABDOMINAL HYSTERECTOMY    ? BLADDER SURGERY    ? BREAST SURGERY    ? reduction  ? carpel tunnel surgery    ? right hand  ? EYE SURGERY Left 11/29/2019  ? for diabetic retinopathy  ? FLEXIBLE SIGMOIDOSCOPY  06/21/2020  ? Procedure: FLEXIBLE SIGMOIDOSCOPY;  Surgeon: Leighton Ruff, MD;  Location: [facility];  Service: General;;  ? HERNIA REPAIR    ? KNEE SURGERY    ? left  ? TOE AMPUTATION Bilateral 2018-2020  ? great and 3rd toes  ? XI ROBOTIC ASSISTED LOWER ANTERIOR RESECTION N/A 06/21/2020  ? Procedure: XI ROBOTIC ASSISTED LOWER ANTERIOR RESECTION;  Surgeon: Leighton Ruff, MD;  Location: [facility];  Service: General;  Laterality: N/A;  ? ?Current Outpatient Medications on File Prior to Visit  ?Medication Sig Dispense Refill  ? acetaminophen (TYLENOL) 500 MG tablet  Take 1,000 mg by mouth every 6 (six) hours as needed for headache.    ? calcium carbonate (TUMS - DOSED IN MG ELEMENTAL CALCIUM) 500 MG chewable tablet Chew 1 tablet by mouth daily.    ? Cholecalciferol (VITAMIN D-3) 125 MCG (5000 UT) TABS See admin instructions.    ? Continuous Blood Gluc Receiver (FREESTYLE LIBRE 14 DAY READER) DEVI     ? Continuous Blood Gluc Sensor (FREESTYLE LIBRE 14 DAY SENSOR) MISC     ? cyclobenzaprine (FLEXERIL) 10 MG tablet     ? diphenhydrAMINE (BENADRYL) 25 MG tablet Take 75 mg  by mouth daily as needed for allergies.    ? doxycycline (MONODOX) 100 MG capsule Take 100 mg by mouth 2 (two) times daily.    ? fenofibrate (TRICOR) 145 MG tablet Take 1 tablet by mouth daily.    ? furosemide (LASIX) 20 MG tablet Take 40 mg by mouth daily.     ? gabapentin (NEURONTIN) 300 MG capsule 1 capsule    ? Glucagon (GVOKE HYPOPEN 2-PACK) 1 MG/0.2ML SOAJ as directed for severe low blood sugar    ? Glucose Blood (BLOOD GLUCOSE TEST STRIPS) STRP See admin instructions.    ? HUMALOG KWIKPEN 200 UNIT/ML SOPN Inject 20-30 Units into the skin 3 (three) times daily with meals.     ? hydrocortisone cream 1 % Apply 1 application topically daily as needed for itching.    ? ID NOW COVID-19 KIT TEST AS DIRECTED TODAY    ? Insulin Pen Needle (B-D ULTRAFINE III SHORT PEN) 31G X 8 MM MISC See admin instructions.    ? LINZESS 145 MCG CAPS capsule Take 145 mcg by mouth daily as needed for constipation.    ? metFORMIN (GLUCOPHAGE) 1000 MG tablet Take 1 tablet by mouth 2 (two) times daily.    ? milk thistle 175 MG tablet Take 175 mg by mouth daily.    ? MOUNJARO 5 MG/0.5ML Pen Inject into the skin once a week.    ? ofloxacin (OCUFLOX) 0.3 % ophthalmic solution Place 1 drop into the right eye 4 (four) times daily.    ? Omega-3 Fatty Acids (FISH OIL) 1000 MG CAPS 2 capsule    ? oxyCODONE (ROXICODONE) 15 MG immediate release tablet Take 15 mg by mouth 3 (three) times daily.     ? pantoprazole (PROTONIX) 40 MG tablet Take 40 mg by mouth daily.    ? Polyethyl Glycol-Propyl Glycol (SYSTANE OP) Place 1 drop into both eyes 2 (two) times daily.    ? polyethylene glycol-electrolytes (NULYTELY) 420 g solution MIX AND DRINK AS DIRECTED    ? prednisoLONE acetate (PRED FORTE) 1 % ophthalmic suspension Place into the left eye.    ? Probiotic CHEW Chew 2 tablets by mouth daily as needed (immune support).    ? prochlorperazine (COMPAZINE) 10 MG tablet Take 1 tablet (10 mg total) by mouth every 6 (six) hours as needed for nausea or vomiting. 30  tablet 2  ? rosuvastatin (CRESTOR) 20 MG tablet Take 20 mg by mouth daily.    ? sertraline (ZOLOFT) 100 MG tablet Take 1 tablet by mouth daily.    ? simethicone (MYLICON) 665 MG chewable tablet Chew 125 mg by mouth every 6 (six) hours as needed for flatulence.    ? tirzepatide (MOUNJARO) 10 MG/0.5ML Pen INJECT UNDER THE SKIN AS DIRECTED ONCE WEEKLY    ? TRESIBA FLEXTOUCH 100 UNIT/ML SOPN FlexTouch Pen Inject 60 Units into the skin daily.     ?  triamcinolone ointment (KENALOG) 0.1 %     ? Vilazodone HCl (VIIBRYD) 20 MG TABS 1 tablet with food    ? Vitamin D, Ergocalciferol, (DRISDOL) 1.25 MG (50000 UNIT) CAPS capsule 1 capsule    ? zolpidem (AMBIEN) 10 MG tablet Take 10 mg by mouth at bedtime.     ? ?No current facility-administered medications on file prior to visit.  ?  ?Allergies  ?Allergen Reactions  ? Morphine Other (See Comments)  ? Tramadol Hcl Other (See Comments)  ? Bactrim [Sulfamethoxazole-Trimethoprim] Rash  ? ?Social History  ? ?Occupational History  ? Not on file  ?Tobacco Use  ? Smoking status: Former  ? Smokeless tobacco: Never  ?Vaping Use  ? Vaping Use: Never used  ?Substance and Sexual Activity  ? Alcohol use: No  ? Drug use: No  ? Sexual activity: Not on file  ? ?History reviewed. No pertinent family history. ? ?There is no immunization history on file for this patient.  ? ?Review of Systems: Negative except as noted in the HPI.  ? ?Objective: ?There were no vitals filed for this visit. ? ?Gloria Hodge is a pleasant 56 y.o. female in NAD. AAO X 3. ? ?Vascular Examination: ?CFT <4 seconds b/l LE. Palpable DP pulse(s) b/l LE. Palpable PT pulse(s) b/l LE. Pedal hair present. No pain with calf compression b/l. Lower extremity skin temperature gradient within normal limits. No edema noted b/l LE. No ischemia or gangrene noted b/l LE. No cyanosis or clubbing noted b/l LE. ? ?Dermatological Examination: ?Pedal skin is warm and supple b/l LE. No open wounds b/l LE. No interdigital  macerations noted b/l LE. Toenails bilateral 5th toes and R 2nd toe elongated, discolored, dystrophic, thickened, and crumbly with subungual debris and tenderness to dorsal palpation. No hyperkeratotic nor p

## 2022-05-06 ENCOUNTER — Other Ambulatory Visit: Payer: Self-pay

## 2022-05-16 ENCOUNTER — Encounter: Payer: Self-pay | Admitting: Podiatry

## 2022-05-16 ENCOUNTER — Ambulatory Visit: Payer: Medicare Other | Admitting: Podiatry

## 2022-05-16 DIAGNOSIS — L84 Corns and callosities: Secondary | ICD-10-CM

## 2022-05-16 DIAGNOSIS — N1832 Chronic kidney disease, stage 3b: Secondary | ICD-10-CM | POA: Insufficient documentation

## 2022-05-16 DIAGNOSIS — Z89412 Acquired absence of left great toe: Secondary | ICD-10-CM | POA: Diagnosis not present

## 2022-05-16 DIAGNOSIS — B351 Tinea unguium: Secondary | ICD-10-CM | POA: Diagnosis not present

## 2022-05-16 DIAGNOSIS — E1142 Type 2 diabetes mellitus with diabetic polyneuropathy: Secondary | ICD-10-CM | POA: Diagnosis not present

## 2022-05-16 DIAGNOSIS — Z89411 Acquired absence of right great toe: Secondary | ICD-10-CM

## 2022-05-16 DIAGNOSIS — Z89421 Acquired absence of other right toe(s): Secondary | ICD-10-CM

## 2022-05-16 DIAGNOSIS — Z780 Asymptomatic menopausal state: Secondary | ICD-10-CM | POA: Insufficient documentation

## 2022-05-16 DIAGNOSIS — Z89422 Acquired absence of other left toe(s): Secondary | ICD-10-CM

## 2022-05-17 ENCOUNTER — Other Ambulatory Visit: Payer: Self-pay

## 2022-05-20 ENCOUNTER — Inpatient Hospital Stay: Payer: Medicare Other | Attending: Oncology

## 2022-05-20 DIAGNOSIS — Z85048 Personal history of other malignant neoplasm of rectum, rectosigmoid junction, and anus: Secondary | ICD-10-CM | POA: Insufficient documentation

## 2022-05-20 DIAGNOSIS — C20 Malignant neoplasm of rectum: Secondary | ICD-10-CM

## 2022-05-22 LAB — CEA: CEA: 2.5 ng/mL (ref 0.0–4.7)

## 2022-05-22 NOTE — Progress Notes (Signed)
[facility]  [address] [place],  [place]  [address] [phone]  Clinic Day:  01/21/2022  Referring physician: Bonnita Nasuti, MD  HISTORY OF PRESENT ILLNESS:  The patient is a 56 y.o. female with stage IIB (T3 N0 M0) rectal cancer, who comes in today for routine follow-up.  Her definitive therapy consisted of chemoradiation, followed by a low anterior resection, followed by adjuvant FOLFOX  chemotherapy - all of which was completed in February 2022.  CT scans done after the completion of all of her definitive therapy showed complete disease resolution.  Since her last visit, the patient has been doing well.  With respect to her cancer, she denies having any GI symptoms which concern her for disease recurrence.    PHYSICAL EXAM:  There were no vitals taken for this visit. Wt Readings from Last 3 Encounters:  01/21/22 153 lb 4.8 oz (69.5 kg)  09/20/21 187 lb 3.2 oz (84.9 kg)  05/21/21 182 lb 11.2 oz (82.9 kg)   There is no height or weight on file to calculate BMI. Performance status (ECOG): 1 Physical Exam Constitutional:      Appearance: Normal appearance. She is not ill-appearing.  HENT:     Mouth/Throat:     Mouth: Mucous membranes are moist.     Pharynx: Oropharynx is clear. No oropharyngeal exudate or posterior oropharyngeal erythema.  Cardiovascular:     Rate and Rhythm: Normal rate and regular rhythm.     Heart sounds: No murmur heard.    No friction rub. No gallop.  Pulmonary:     Effort: Pulmonary effort is normal. No respiratory distress.     Breath sounds: Normal breath sounds. No wheezing, rhonchi or rales.  Abdominal:     General: Bowel sounds are normal. There is no distension.     Palpations: Abdomen is soft. There is no mass.     Tenderness: There is no abdominal tenderness.  Musculoskeletal:        General: No swelling.     Right lower leg: No edema.     Left lower leg: No edema.  Lymphadenopathy:     Cervical: No  cervical adenopathy.     Upper Body:     Right upper body: No supraclavicular or axillary adenopathy.     Left upper body: No supraclavicular or axillary adenopathy.     Lower Body: No right inguinal adenopathy. No left inguinal adenopathy.  Skin:    General: Skin is warm.     Coloration: Skin is not jaundiced.     Findings: No lesion or rash.  Neurological:     General: No focal deficit present.     Mental Status: She is alert and oriented to person, place, and time. Mental status is at baseline.  Psychiatric:        Mood and Affect: Mood normal.        Behavior: Behavior normal.        Thought Content: Thought content normal.   LABS:    Latest Reference Range & Units 09/19/21 14:45 01/18/22 14:29  CEA 0.0 - 4.7 ng/mL 2.1 1.5       ASSESSMENT & PLAN:  Assessment/Plan:  A 56 y.o. female with stage IIB rectal cancer, status post the completion of all of her definitive therapy.  I am pleased that her physical exam and CEA level remain normal.  Clinically, the patient is doing very well.  I will continue to follow her with physical exams and  CEA levels every 4 months to ensure she remains disease free.  The patient understands all the plans discussed today and is in agreement with them.     I, Rita Ohara, am acting as scribe for Marice Potter, MD    I have reviewed this report as typed by the medical scribe, and it is complete and accurate.  Ricki Clack Macarthur Critchley, MD

## 2022-05-23 ENCOUNTER — Other Ambulatory Visit: Payer: Self-pay | Admitting: Oncology

## 2022-05-23 ENCOUNTER — Inpatient Hospital Stay: Payer: Medicare Other | Admitting: Oncology

## 2022-05-23 VITALS — BP 164/74 | HR 96 | Temp 98.4°F | Resp 16 | Ht 62.0 in | Wt 148.8 lb

## 2022-05-23 DIAGNOSIS — C20 Malignant neoplasm of rectum: Secondary | ICD-10-CM

## 2022-05-25 NOTE — Progress Notes (Signed)
  Subjective:  Patient ID: Gloria Hodge, female    DOB: [birth year],  MRN: [id number]  Elliot Dally Popoff presents to clinic today for at risk foot care. Patient has h/o amputation of partial amputation of bilateral 3rd toes and L 2nd toe and digital amputation bilateral great toes and bilateral 4th toes and callus(es) left lower extremity and painful thick toenails that are difficult to trim. Painful toenails interfere with ambulation. Aggravating factors include wearing enclosed shoe gear. Pain is relieved with periodic professional debridement. Painful calluses are aggravated when weightbearing with and without shoegear. Pain is relieved with periodic professional debridement.  Patient states blood glucose was 112 mg/dl today.  Last A1c was 5.5%. Patient states she lost 34 pounds on Mounjaro.  New problem(s): None.   PCP is Bonnita Nasuti, MD , and last visit was  April 29, 2022  Allergies  Allergen Reactions   Morphine Other (See Comments)   Tramadol Hcl Other (See Comments)   Bactrim [Sulfamethoxazole-Trimethoprim] Rash    Review of Systems: Negative except as noted in the HPI.  Objective: No changes noted in today's physical examination. Naydeen Speirs Mcinroy is a pleasant 56 y.o. female in NAD. AAO X 3.  Vascular Examination: CFT <4 seconds b/l LE. Palpable DP pulse(s) b/l LE. Palpable PT pulse(s) b/l LE. Pedal hair present. No pain with calf compression b/l. Lower extremity skin temperature gradient within normal limits. No edema noted b/l LE. No ischemia or gangrene noted b/l LE. No cyanosis or clubbing noted b/l LE.  Dermatological Examination: Pedal skin is warm and supple b/l LE. No open wounds b/l LE. No interdigital macerations noted b/l LE. Toenails bilateral 5th toes and R 2nd toe elongated, discolored, dystrophic, thickened, and crumbly with subungual debris and tenderness to dorsal palpation. Hyperkeratotic lesion(s) 1st metatarsal head left  lower extremity. No erythema, no edema, no drainage, no fluctuance noted.   Neurological Examination: Pt has subjective symptoms of neuropathy. Protective sensation diminished with 10g monofilament b/l. Vibratory sensation intact b/l.  Musculoskeletal Examination: Muscle strength 5/5 to all lower extremity muscle groups bilaterally. No pain, crepitus or joint limitation noted with ROM bilateral LE. Lower extremity amputation(s): partial amputation of bilateral 3rd toes and L 2nd toe and digital amputation bilateral great toes and bilateral 4th toes.  Assessment/Plan: 1. Onychomycosis   2. Callus   3. 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 History of amputation of great toe of both feet ([place])   5. Diabetic peripheral neuropathy associated with type 2 diabetes mellitus ([place])   -Examined patient. -Patient to continue soft, supportive shoe gear daily. -Mycotic toenails bilateral 5th toes and R 2nd toe were debrided in length and girth with sterile nail nippers and dremel without iatrogenic bleeding. -Callus(es) 1st metatarsal head left lower extremity pared utilizing sterile scalpel blade without complication or incident. Total number debrided =1. -Patient/POA to call should there be question/concern in the interim.   Return in about 3 months (around 08/16/2022).  Marzetta Board, DPM

## 2022-08-29 ENCOUNTER — Ambulatory Visit: Payer: Medicare Other | Admitting: Podiatry

## 2022-09-20 ENCOUNTER — Inpatient Hospital Stay: Payer: Medicare Other | Attending: Oncology

## 2022-09-20 DIAGNOSIS — Z9221 Personal history of antineoplastic chemotherapy: Secondary | ICD-10-CM | POA: Diagnosis not present

## 2022-09-20 DIAGNOSIS — Z85048 Personal history of other malignant neoplasm of rectum, rectosigmoid junction, and anus: Secondary | ICD-10-CM | POA: Diagnosis present

## 2022-09-20 DIAGNOSIS — C20 Malignant neoplasm of rectum: Secondary | ICD-10-CM

## 2022-09-20 DIAGNOSIS — Z923 Personal history of irradiation: Secondary | ICD-10-CM | POA: Diagnosis not present

## 2022-09-21 LAB — CEA: CEA: 1.9 ng/mL (ref 0.0–4.7)

## 2022-09-22 NOTE — Progress Notes (Signed)
[facility]  [address] [place],  [place]  [address] [phone]  Clinic Day:  09/23/2022  Referring physician: Bonnita Nasuti, MD  HISTORY OF PRESENT ILLNESS:  The patient is a 56 y.o. female with stage IIB (T3 N0 M0) rectal cancer, who comes in today for routine follow-up.  Her definitive therapy consisted of chemoradiation, followed by a low anterior resection, followed by adjuvant FOLFOX  chemotherapy - all of which was completed in February 2022.  CT scans done after the completion of all of her definitive therapy showed complete disease resolution.  Since her last visit, the patient has been doing well.  With respect to her cancer, she denies having any GI symptoms which concern her for disease recurrence.    PHYSICAL EXAM:  Blood pressure (!) 141/65, pulse 93, temperature 98 F (36.7 C), resp. rate 16, height '5\' 2"'$  (1.575 m), weight 133 lb 8 oz (60.6 kg), SpO2 99 %. Wt Readings from Last 3 Encounters:  09/23/22 133 lb 8 oz (60.6 kg)  05/23/22 148 lb 12.8 oz (67.5 kg)  01/21/22 153 lb 4.8 oz (69.5 kg)   Body mass index is 24.42 kg/m. Performance status (ECOG): 1 Physical Exam Constitutional:      Appearance: Normal appearance. She is not ill-appearing.  HENT:     Mouth/Throat:     Mouth: Mucous membranes are moist.     Pharynx: Oropharynx is clear. No oropharyngeal exudate or posterior oropharyngeal erythema.  Cardiovascular:     Rate and Rhythm: Normal rate and regular rhythm.     Heart sounds: No murmur heard.    No friction rub. No gallop.  Pulmonary:     Effort: Pulmonary effort is normal. No respiratory distress.     Breath sounds: Normal breath sounds. No wheezing, rhonchi or rales.  Abdominal:     General: Bowel sounds are normal. There is no distension.     Palpations: Abdomen is soft. There is no mass.     Tenderness: There is no abdominal tenderness.  Musculoskeletal:        General: No swelling.     Right lower leg: No  edema.     Left lower leg: No edema.  Lymphadenopathy:     Cervical: No cervical adenopathy.     Upper Body:     Right upper body: No supraclavicular or axillary adenopathy.     Left upper body: No supraclavicular or axillary adenopathy.     Lower Body: No right inguinal adenopathy. No left inguinal adenopathy.  Skin:    General: Skin is warm.     Coloration: Skin is not jaundiced.     Findings: No lesion or rash.  Neurological:     General: No focal deficit present.     Mental Status: She is alert and oriented to person, place, and time. Mental status is at baseline.  Psychiatric:        Mood and Affect: Mood normal.        Behavior: Behavior normal.        Thought Content: Thought content normal.    LABS:    Latest Reference Range & Units 09/20/22 13:38  CEA 0.0 - 4.7 ng/mL 1.9   ASSESSMENT & PLAN:  Assessment/Plan:  A 56 y.o. female with stage IIB rectal cancer, who approaches 2 years out from the completion of all of her definitive therapy.  I am pleased that her physical exam and CEA level remain normal.  Clinically, the patient is  doing very well.  I will continue to follow her with physical exams and CEA levels every 4 months to ensure she remains disease free.  The patient understands all the plans discussed today and is in agreement with them.    Gloria Ureste Macarthur Critchley, MD

## 2022-09-23 ENCOUNTER — Inpatient Hospital Stay (INDEPENDENT_AMBULATORY_CARE_PROVIDER_SITE_OTHER): Payer: Medicare Other | Admitting: Oncology

## 2022-09-23 ENCOUNTER — Other Ambulatory Visit: Payer: Self-pay | Admitting: Oncology

## 2022-09-23 VITALS — BP 141/65 | HR 93 | Temp 98.0°F | Resp 16 | Ht 62.0 in | Wt 133.5 lb

## 2022-09-23 DIAGNOSIS — C20 Malignant neoplasm of rectum: Secondary | ICD-10-CM | POA: Diagnosis not present

## 2022-12-10 ENCOUNTER — Other Ambulatory Visit: Payer: Self-pay | Admitting: Neurosurgery

## 2022-12-23 NOTE — Pre-Procedure Instructions (Signed)
Surgical Instructions    Your procedure is scheduled on Friday, March 15th.  Report to [facility] Main Entrance "A" at 10:15 A.M., then check in with the Admitting office.  Call this number if you have problems the morning of surgery:  [phone]  If you have any questions prior to your surgery date call [phone]: Open Monday-Friday 8am-4pm If you experience any cold or flu symptoms such as cough, fever, chills, shortness of breath, etc. between now and your scheduled surgery, please notify us at the above number.     Remember:  Do not eat or drink after midnight the night before your surgery     Take these medicines the morning of surgery with A SIP OF WATER  alendronate (FOSAMAX)  fenofibrate (TRICOR)  ondansetron (ZOFRAN)  Oxycodone HCl  pantoprazole (PROTONIX)  rosuvastatin (CRESTOR)     As of today, STOP taking any Aspirin (unless otherwise instructed by your surgeon) Aleve, Naproxen, Ibuprofen, Motrin, Advil, Goody's, BC's, all herbal medications, fish oil, and all vitamins.  WHAT DO I DO ABOUT MY DIABETES MEDICATION?   Do not take metFORMIN (GLUCOPHAGE) the morning of surgery.  Stop taking tirzepatide [facility]) 7 days prior to surgery.         THE MORNING OF SURGERY, take 12.5 units (50%) of [place]. OR THE NIGHT BEFORE SURGERY, take 12.5 units (50%) of [place].     HOW TO MANAGE YOUR DIABETES BEFORE AND AFTER SURGERY  Why is it important to control my blood sugar before and after surgery? Improving blood sugar levels before and after surgery helps healing and can limit problems. A way of improving blood sugar control is eating a healthy diet by:  Eating less sugar and carbohydrates  Increasing activity/exercise  Talking with your doctor about reaching your blood sugar goals High blood sugars (greater than 180 mg/dL) can raise your risk of infections and slow your recovery, so you will need to focus on controlling your diabetes  during the weeks before surgery. Make sure that the doctor who takes care of your diabetes knows about your planned surgery including the date and location.  How do I manage my blood sugar before surgery? Check your blood sugar at least 4 times a day, starting 2 days before surgery, to make sure that the level is not too high or low.  Check your blood sugar the morning of your surgery when you wake up and every 2 hours until you get to the Short Stay unit.  If your blood sugar is less than 70 mg/dL, you will need to treat for low blood sugar: Do not take insulin. Treat a low blood sugar (less than 70 mg/dL) with  cup of clear juice (cranberry or apple), 4 glucose tablets, OR glucose gel. Recheck blood sugar in 15 minutes after treatment (to make sure it is greater than 70 mg/dL). If your blood sugar is not greater than 70 mg/dL on recheck, call [phone] for further instructions. Report your blood sugar to the short stay nurse when you get to Short Stay.  If you are admitted to the hospital after surgery: Your blood sugar will be checked by the staff and you will probably be given insulin after surgery (instead of oral diabetes medicines) to make sure you have good blood sugar levels. The goal for blood sugar control after surgery is 80-180 mg/dL.                     Do NOT Smoke (Tobacco/Vaping)  for 24 hours prior to your procedure.  If you use a CPAP at night, you may bring your mask/headgear for your overnight stay.   Contacts, glasses, piercing's, hearing aid's, dentures or partials may not be worn into surgery, please bring cases for these belongings.    For patients admitted to the hospital, discharge time will be determined by your treatment team.   Patients discharged the day of surgery will not be allowed to drive home, and someone needs to stay with them for 24 hours.  SURGICAL WAITING ROOM VISITATION Patients having surgery or a procedure may have no more than 2 support  people in the waiting area - these visitors may rotate.   Children under the age of 66 must have an adult with them who is not the patient. If the patient needs to stay at the hospital during part of their recovery, the visitor guidelines for inpatient rooms apply. Pre-op nurse will coordinate an appropriate time for 1 support person to accompany patient in pre-op.  This support person may not rotate.   Please refer to the [facility] website for the visitor guidelines for Inpatients (after your surgery is over and you are in a regular room).    Special instructions:   [facility]- Preparing For Surgery  Before surgery, you can play an important role. Because skin is not sterile, your skin needs to be as free of germs as possible. You can reduce the number of germs on your skin by washing with CHG (chlorahexidine gluconate) Soap before surgery.  CHG is an antiseptic cleaner which kills germs and bonds with the skin to continue killing germs even after washing.    Oral Hygiene is also important to reduce your risk of infection.  Remember - BRUSH YOUR TEETH THE MORNING OF SURGERY WITH YOUR REGULAR TOOTHPASTE  Please do not use if you have an allergy to CHG or antibacterial soaps. If your skin becomes reddened/irritated stop using the CHG.  Do not shave (including legs and underarms) for at least 48 hours prior to first CHG shower. It is OK to shave your face.  Please follow these instructions carefully.   Shower the NIGHT BEFORE SURGERY and the MORNING OF SURGERY  If you chose to wash your hair, wash your hair first as usual with your normal shampoo.  After you shampoo, rinse your hair and body thoroughly to remove the shampoo.  Use CHG Soap as you would any other liquid soap. You can apply CHG directly to the skin and wash gently with a scrungie or a clean washcloth.   Apply the CHG Soap to your body ONLY FROM THE NECK DOWN.  Do not use on open wounds or open sores. Avoid contact with your  eyes, ears, mouth and genitals (private parts). Wash Face and genitals (private parts)  with your normal soap.   Wash thoroughly, paying special attention to the area where your surgery will be performed.  Thoroughly rinse your body with warm water from the neck down.  DO NOT shower/wash with your normal soap after using and rinsing off the CHG Soap.  Pat yourself dry with a CLEAN TOWEL.  Wear CLEAN PAJAMAS to bed the night before surgery  Place CLEAN SHEETS on your bed the night before your surgery  DO NOT SLEEP WITH PETS.   Day of Surgery: Take a shower with CHG soap. Do not wear jewelry or makeup Do not wear lotions, powders, perfumes, or deodorant. Do not shave 48 hours prior to surgery.  Do not bring valuables to the hospital. [facility] is not responsible for any belongings or valuables. Do not wear nail polish, gel polish, artificial nails, or any other type of covering on natural nails (fingers and toes) If you have artificial nails or gel coating that need to be removed by a nail salon, please have this removed prior to surgery. Artificial nails or gel coating may interfere with anesthesia's ability to adequately monitor your vital signs. Wear Clean/Comfortable clothing the morning of surgery Remember to brush your teeth WITH YOUR REGULAR TOOTHPASTE.   Please read over the following fact sheets that you were given.    If you received a COVID test during your pre-op visit  it is requested that you wear a mask when out in public, stay away from anyone that may not be feeling well and notify your surgeon if you develop symptoms. If you have been in contact with anyone that has tested positive in the last 10 days please notify you surgeon.

## 2022-12-24 ENCOUNTER — Encounter (HOSPITAL_COMMUNITY): Payer: Self-pay

## 2022-12-24 ENCOUNTER — Other Ambulatory Visit: Payer: Self-pay

## 2022-12-24 ENCOUNTER — Encounter (HOSPITAL_COMMUNITY)
Admission: RE | Admit: 2022-12-24 | Discharge: 2022-12-24 | Disposition: A | Discharge status: Home / Self Care | Payer: Medicare Other | Source: Ambulatory Visit | Attending: Neurosurgery | Admitting: Neurosurgery

## 2022-12-24 VITALS — BP 139/84 | HR 94 | Temp 98.5°F | Resp 18 | Ht 62.0 in | Wt 142.6 lb

## 2022-12-24 DIAGNOSIS — Z794 Long term (current) use of insulin: Secondary | ICD-10-CM | POA: Diagnosis not present

## 2022-12-24 DIAGNOSIS — K760 Fatty (change of) liver, not elsewhere classified: Secondary | ICD-10-CM | POA: Diagnosis not present

## 2022-12-24 DIAGNOSIS — Z01818 Encounter for other preprocedural examination: Secondary | ICD-10-CM | POA: Diagnosis not present

## 2022-12-24 DIAGNOSIS — E119 Type 2 diabetes mellitus without complications: Secondary | ICD-10-CM | POA: Insufficient documentation

## 2022-12-24 HISTORY — DX: Fatty (change of) liver, not elsewhere classified: K76.0

## 2022-12-24 HISTORY — DX: Personal history of urinary calculi: Z87.442

## 2022-12-24 LAB — COMPREHENSIVE METABOLIC PANEL
ALT: 22 U/L (ref 0–44)
AST: 27 U/L (ref 15–41)
Albumin: 3.8 g/dL (ref 3.5–5.0)
Alkaline Phosphatase: 27 U/L — ABNORMAL LOW (ref 38–126)
Anion gap: 10 (ref 5–15)
BUN: 25 mg/dL — ABNORMAL HIGH (ref 6–20)
CO2: 26 mmol/L (ref 22–32)
Calcium: 9.3 mg/dL (ref 8.9–10.3)
Chloride: 102 mmol/L (ref 98–111)
Creatinine, Ser: 1.42 mg/dL — ABNORMAL HIGH (ref 0.44–1.00)
GFR, Estimated: 44 mL/min — ABNORMAL LOW (ref >60)
Glucose, Bld: 86 mg/dL (ref 70–99)
Potassium: 4.1 mmol/L (ref 3.5–5.1)
Sodium: 138 mmol/L (ref 135–145)
Total Bilirubin: 0.3 mg/dL (ref 0.3–1.2)
Total Protein: 6.8 g/dL (ref 6.5–8.1)

## 2022-12-24 LAB — TYPE AND SCREEN
ABO/RH(D): A NEG
Antibody Screen: NEGATIVE

## 2022-12-24 LAB — CBC
HCT: 36.1 % (ref 36.0–46.0)
Hemoglobin: 11.9 g/dL — ABNORMAL LOW (ref 12.0–15.0)
MCH: 29.7 pg (ref 26.0–34.0)
MCHC: 33 g/dL (ref 30.0–36.0)
MCV: 90 fL (ref 80.0–100.0)
Platelets: 273 10*3/uL (ref 150–400)
RBC: 4.01 MIL/uL (ref 3.87–5.11)
RDW: 14.6 % (ref 11.5–15.5)
WBC: 5.3 10*3/uL (ref 4.0–10.5)
nRBC: 0 % (ref 0.0–0.2)

## 2022-12-24 LAB — GLUCOSE, CAPILLARY: Glucose-Capillary: 92 mg/dL (ref 70–99)

## 2022-12-24 LAB — SURGICAL PCR SCREEN
MRSA, PCR: NEGATIVE
Staphylococcus aureus: NEGATIVE

## 2022-12-24 NOTE — Progress Notes (Signed)
PCP: Dr. Donnetta Hutching Cardiologist: Denies Oncologist: Dr. Lavera Guise  EKG: Today CXR: n/a ECHO: denies Stress Test: denies Cardiac Cath: denies  Fasting Blood Sugar-70-90, reports some hypoglycemic events in the 50's upon waking Reviewed Hypoglycemic protocol the day of surgery, pt verbalized understanding Checks Blood Sugar TID  LD Mounjaro 12/18/22  ERAS: NPO   Patient denies shortness of breath, fever, cough, and chest pain at PAT appointment.  Patient verbalized understanding of instructions provided today at the PAT appointment.  Patient asked to review instructions at home and day of surgery.

## 2022-12-25 ENCOUNTER — Encounter: Payer: Self-pay | Admitting: Podiatry

## 2022-12-25 ENCOUNTER — Ambulatory Visit: Payer: Medicare Other | Admitting: Podiatry

## 2022-12-25 DIAGNOSIS — E1142 Type 2 diabetes mellitus with diabetic polyneuropathy: Secondary | ICD-10-CM | POA: Diagnosis not present

## 2022-12-25 DIAGNOSIS — B351 Tinea unguium: Secondary | ICD-10-CM | POA: Diagnosis not present

## 2022-12-25 LAB — HEMOGLOBIN A1C
Hgb A1c MFr Bld: 5.3 % (ref 4.8–5.6)
Mean Plasma Glucose: 105 mg/dL

## 2022-12-25 NOTE — Progress Notes (Signed)
  Subjective:  Patient ID: Gloria Hodge, female    DOB: [birth year],   MRN: [id number]  Chief Complaint  Patient presents with   Nail Problem    Diabetic Foot Care     57 y.o. female presents for concern of thickened elongated and painful nails that are difficult to trim. Requesting to have them trimmed today. Relates burning and tingling in their feet. Patient is diabetic and last A1c was  Lab Results  Component Value Date   HGBA1C 5.3 12/24/2022   .   PCP:  Bonnita Nasuti, MD    . Denies any other pedal complaints. Denies n/v/f/c.   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 CFT <3 seconds. Absent hair growth on digits. Edema noted to bilateral lower extremities. Xerosis noted bilaterally.  Skin. No lacerations or abrasions bilateral feet. Remaining nails are thickened elongated and with subungual debris x3.  Musculoskeletal: MMT 5/5 bilateral lower extremities in DF, PF, Inversion and Eversion. Deceased ROM in DF of ankle joint. Partial amputation of bilateral 3rd toes and L 2nd toe and digital amputation bilateral great toes and bilateral 4th toes.  Neurological: Sensation intact to light touch. Protective sensation diminished bilateral.    Assessment:   1. Onychomycosis   2. Diabetic peripheral neuropathy associated with type 2 diabetes mellitus ([place])      Plan:  Patient was evaluated and treated and all questions  answered. -Discussed and educated patient on diabetic foot care, especially with  regards to the vascular, neurological and musculoskeletal systems.  -Stressed the importance of good glycemic control and the detriment of not  controlling glucose levels in relation to the foot. -Discussed supportive shoes at all times and checking feet regularly.  -Mechanically debrided all residual nails bilateral x 3  using sterile nail nipper and filed with dremel without incident  -Answered all patient questions -Patient to return  in 3 months for at risk foot care -Patient advised to call the office if any problems or questions arise in the meantime.   Lorenda Peck, DPM

## 2022-12-26 ENCOUNTER — Ambulatory Visit: Payer: Medicare Other | Admitting: Podiatry

## 2022-12-26 NOTE — Progress Notes (Signed)
Patient was called and informed that the surgery time for tomorrow was changed to 11:16 o'clock. Patient was instructed to be at the hospital at 09:15 o'clock. Patient verbalized understanding.

## 2022-12-27 ENCOUNTER — Inpatient Hospital Stay (HOSPITAL_COMMUNITY): Payer: Medicare Other

## 2022-12-27 ENCOUNTER — Other Ambulatory Visit: Payer: Self-pay

## 2022-12-27 ENCOUNTER — Inpatient Hospital Stay (HOSPITAL_COMMUNITY): Payer: Medicare Other | Admitting: Physician Assistant

## 2022-12-27 ENCOUNTER — Encounter (HOSPITAL_COMMUNITY): Payer: Self-pay | Admitting: Neurosurgery

## 2022-12-27 ENCOUNTER — Inpatient Hospital Stay (HOSPITAL_COMMUNITY)
Admission: RE | Admit: 2022-12-27 | Discharge: 2022-12-28 | DRG: 455 | Disposition: A | Discharge status: Home / Self Care | Payer: Medicare Other | Attending: Neurosurgery | Admitting: Neurosurgery

## 2022-12-27 ENCOUNTER — Encounter (HOSPITAL_COMMUNITY)
Admission: RE | Disposition: A | Discharge status: Home / Self Care | Payer: Self-pay | Source: Home / Self Care | Attending: Neurosurgery

## 2022-12-27 DIAGNOSIS — Z79899 Other long term (current) drug therapy: Secondary | ICD-10-CM

## 2022-12-27 DIAGNOSIS — Z90710 Acquired absence of both cervix and uterus: Secondary | ICD-10-CM | POA: Diagnosis not present

## 2022-12-27 DIAGNOSIS — M19041 Primary osteoarthritis, right hand: Secondary | ICD-10-CM | POA: Diagnosis present

## 2022-12-27 DIAGNOSIS — M5117 Intervertebral disc disorders with radiculopathy, lumbosacral region: Secondary | ICD-10-CM

## 2022-12-27 DIAGNOSIS — M5116 Intervertebral disc disorders with radiculopathy, lumbar region: Secondary | ICD-10-CM | POA: Diagnosis present

## 2022-12-27 DIAGNOSIS — M19042 Primary osteoarthritis, left hand: Secondary | ICD-10-CM | POA: Diagnosis present

## 2022-12-27 DIAGNOSIS — M48061 Spinal stenosis, lumbar region without neurogenic claudication: Secondary | ICD-10-CM | POA: Diagnosis present

## 2022-12-27 DIAGNOSIS — Z85048 Personal history of other malignant neoplasm of rectum, rectosigmoid junction, and anus: Secondary | ICD-10-CM

## 2022-12-27 DIAGNOSIS — Z9221 Personal history of antineoplastic chemotherapy: Secondary | ICD-10-CM

## 2022-12-27 DIAGNOSIS — Z87442 Personal history of urinary calculi: Secondary | ICD-10-CM | POA: Diagnosis not present

## 2022-12-27 DIAGNOSIS — K760 Fatty (change of) liver, not elsewhere classified: Secondary | ICD-10-CM | POA: Diagnosis present

## 2022-12-27 DIAGNOSIS — E119 Type 2 diabetes mellitus without complications: Secondary | ICD-10-CM | POA: Diagnosis not present

## 2022-12-27 DIAGNOSIS — E11319 Type 2 diabetes mellitus with unspecified diabetic retinopathy without macular edema: Secondary | ICD-10-CM | POA: Diagnosis present

## 2022-12-27 DIAGNOSIS — Z7983 Long term (current) use of bisphosphonates: Secondary | ICD-10-CM | POA: Diagnosis not present

## 2022-12-27 DIAGNOSIS — Z7985 Long-term (current) use of injectable non-insulin antidiabetic drugs: Secondary | ICD-10-CM | POA: Diagnosis not present

## 2022-12-27 DIAGNOSIS — M4316 Spondylolisthesis, lumbar region: Secondary | ICD-10-CM

## 2022-12-27 DIAGNOSIS — Z87891 Personal history of nicotine dependence: Secondary | ICD-10-CM | POA: Diagnosis not present

## 2022-12-27 DIAGNOSIS — Z7984 Long term (current) use of oral hypoglycemic drugs: Secondary | ICD-10-CM

## 2022-12-27 DIAGNOSIS — F32A Depression, unspecified: Secondary | ICD-10-CM | POA: Diagnosis present

## 2022-12-27 DIAGNOSIS — Z794 Long term (current) use of insulin: Secondary | ICD-10-CM

## 2022-12-27 DIAGNOSIS — Z885 Allergy status to narcotic agent status: Secondary | ICD-10-CM | POA: Diagnosis not present

## 2022-12-27 DIAGNOSIS — Z882 Allergy status to sulfonamides status: Secondary | ICD-10-CM | POA: Diagnosis not present

## 2022-12-27 DIAGNOSIS — F419 Anxiety disorder, unspecified: Secondary | ICD-10-CM | POA: Diagnosis present

## 2022-12-27 DIAGNOSIS — M4310 Spondylolisthesis, site unspecified: Principal | ICD-10-CM | POA: Diagnosis present

## 2022-12-27 DIAGNOSIS — M4807 Spinal stenosis, lumbosacral region: Secondary | ICD-10-CM | POA: Diagnosis not present

## 2022-12-27 LAB — GLUCOSE, CAPILLARY
Glucose-Capillary: 101 mg/dL — ABNORMAL HIGH (ref 70–99)
Glucose-Capillary: 115 mg/dL — ABNORMAL HIGH (ref 70–99)
Glucose-Capillary: 93 mg/dL (ref 70–99)
Glucose-Capillary: 214 mg/dL — ABNORMAL HIGH (ref 70–99)

## 2022-12-27 SURGERY — POSTERIOR LUMBAR FUSION 2 LEVEL
Anesthesia: General | Site: Back

## 2022-12-27 MED ORDER — SODIUM CHLORIDE 0.9% FLUSH: 3.0000 mL | INTRAVENOUS | Status: DC | PRN

## 2022-12-27 MED ORDER — MIDAZOLAM HCL 2 MG/2ML IJ SOLN
INTRAMUSCULAR | Status: DC | PRN
  Administered 2022-12-27: 2 mg via INTRAVENOUS

## 2022-12-27 MED ORDER — VANCOMYCIN HCL 1000 MG IV SOLR
INTRAVENOUS | Status: DC | PRN
  Administered 2022-12-27: 1000 mg via TOPICAL

## 2022-12-27 MED ORDER — PHENYLEPHRINE 80 MCG/ML (10ML) SYRINGE FOR IV PUSH (FOR BLOOD PRESSURE SUPPORT)
PREFILLED_SYRINGE | INTRAVENOUS | Status: DC | PRN
  Administered 2022-12-27: 160 ug via INTRAVENOUS
  Administered 2022-12-27 (×3): 80 ug via INTRAVENOUS
  Administered 2022-12-27: 160 ug via INTRAVENOUS
  Administered 2022-12-27: 80 ug via INTRAVENOUS

## 2022-12-27 MED ORDER — ONDANSETRON HCL 4 MG/2ML IJ SOLN
INTRAMUSCULAR | Status: AC
  Filled 2022-12-27: qty 2

## 2022-12-27 MED ORDER — OXYCODONE HCL 5 MG PO TABS: 5.0000 mg | ORAL_TABLET | Freq: Once | ORAL | Status: DC | PRN

## 2022-12-27 MED ORDER — HYDROMORPHONE HCL 1 MG/ML IJ SOLN
1.0000 mg | INTRAMUSCULAR | Status: DC | PRN
  Administered 2022-12-28: 1 mg via INTRAVENOUS
  Filled 2022-12-27: qty 1

## 2022-12-27 MED ORDER — PHENOL 1.4 % MT LIQD: 1.0000 | OROMUCOSAL | Status: DC | PRN

## 2022-12-27 MED ORDER — INSULIN ASPART 100 UNIT/ML IJ SOLN: 0.0000 [IU] | Freq: Three times a day (TID) | INTRAMUSCULAR | Status: DC

## 2022-12-27 MED ORDER — HYDROMORPHONE HCL 1 MG/ML IJ SOLN
INTRAMUSCULAR | Status: AC
  Filled 2022-12-27: qty 0.5

## 2022-12-27 MED ORDER — CHLORHEXIDINE GLUCONATE 0.12 % MT SOLN
OROMUCOSAL | Status: AC
  Administered 2022-12-27: 15 mL via OROMUCOSAL
  Filled 2022-12-27: qty 15

## 2022-12-27 MED ORDER — CYCLOBENZAPRINE HCL 10 MG PO TABS
10.0000 mg | ORAL_TABLET | Freq: Every day | ORAL | Status: DC
  Administered 2022-12-27: 10 mg via ORAL
  Filled 2022-12-27: qty 1

## 2022-12-27 MED ORDER — SODIUM CHLORIDE 0.9% FLUSH: 3.0000 mL | Freq: Two times a day (BID) | INTRAVENOUS | Status: DC

## 2022-12-27 MED ORDER — ACETAMINOPHEN 325 MG PO TABS
650.0000 mg | ORAL_TABLET | ORAL | Status: DC | PRN
  Administered 2022-12-27 – 2022-12-28 (×3): 650 mg via ORAL
  Filled 2022-12-27 (×3): qty 2

## 2022-12-27 MED ORDER — PROPOFOL 10 MG/ML IV BOLUS
INTRAVENOUS | Status: DC | PRN
  Administered 2022-12-27: 140 mg via INTRAVENOUS

## 2022-12-27 MED ORDER — OXYCODONE HCL 5 MG PO TABS
ORAL_TABLET | ORAL | Status: AC
  Filled 2022-12-27: qty 2

## 2022-12-27 MED ORDER — ACETAMINOPHEN 10 MG/ML IV SOLN: 1000.0000 mg | Freq: Once | INTRAVENOUS | Status: DC | PRN

## 2022-12-27 MED ORDER — CEFAZOLIN SODIUM-DEXTROSE 2-3 GM-%(50ML) IV SOLR
INTRAVENOUS | Status: DC | PRN
  Administered 2022-12-27: 2 g via INTRAVENOUS

## 2022-12-27 MED ORDER — HYDROMORPHONE HCL 1 MG/ML IJ SOLN
INTRAMUSCULAR | Status: DC | PRN
  Administered 2022-12-27: .5 mg via INTRAVENOUS

## 2022-12-27 MED ORDER — CEFAZOLIN SODIUM-DEXTROSE 1-4 GM/50ML-% IV SOLN
1.0000 g | Freq: Three times a day (TID) | INTRAVENOUS | Status: AC
  Administered 2022-12-27 – 2022-12-28 (×2): 1 g via INTRAVENOUS
  Filled 2022-12-27 (×2): qty 50

## 2022-12-27 MED ORDER — OXYCODONE HCL 5 MG PO TABS
20.0000 mg | ORAL_TABLET | Freq: Two times a day (BID) | ORAL | Status: DC
  Administered 2022-12-27 – 2022-12-28 (×2): 20 mg via ORAL
  Filled 2022-12-27 (×2): qty 4

## 2022-12-27 MED ORDER — FENTANYL CITRATE (PF) 250 MCG/5ML IJ SOLN
INTRAMUSCULAR | Status: AC
  Filled 2022-12-27: qty 5

## 2022-12-27 MED ORDER — POLYETHYLENE GLYCOL 3350 17 G PO PACK
17.0000 g | PACK | Freq: Every day | ORAL | Status: DC | PRN
  Administered 2022-12-28: 17 g via ORAL
  Filled 2022-12-27: qty 1

## 2022-12-27 MED ORDER — CHLORHEXIDINE GLUCONATE CLOTH 2 % EX PADS: 6.0000 | MEDICATED_PAD | Freq: Once | CUTANEOUS | Status: DC

## 2022-12-27 MED ORDER — THROMBIN 20000 UNITS EX SOLR
CUTANEOUS | Status: DC | PRN
  Administered 2022-12-27: 20 mL via TOPICAL

## 2022-12-27 MED ORDER — ZOLPIDEM TARTRATE 5 MG PO TABS
10.0000 mg | ORAL_TABLET | Freq: Every day | ORAL | Status: DC
  Administered 2022-12-27: 10 mg via ORAL
  Filled 2022-12-27: qty 2

## 2022-12-27 MED ORDER — ONDANSETRON HCL 4 MG/2ML IJ SOLN
INTRAMUSCULAR | Status: DC | PRN
  Administered 2022-12-27: 4 mg via INTRAVENOUS

## 2022-12-27 MED ORDER — FENTANYL CITRATE (PF) 250 MCG/5ML IJ SOLN
INTRAMUSCULAR | Status: DC | PRN
  Administered 2022-12-27 (×2): 50 ug via INTRAVENOUS
  Administered 2022-12-27: 100 ug via INTRAVENOUS
  Administered 2022-12-27: 50 ug via INTRAVENOUS

## 2022-12-27 MED ORDER — GLUCAGON 1 MG/0.2ML ~~LOC~~ SOAJ: 1.0000 mg | Freq: Every day | SUBCUTANEOUS | Status: DC | PRN

## 2022-12-27 MED ORDER — PROPOFOL 10 MG/ML IV BOLUS
INTRAVENOUS | Status: AC
  Filled 2022-12-27: qty 20

## 2022-12-27 MED ORDER — ROSUVASTATIN CALCIUM 5 MG PO TABS: 5.0000 mg | ORAL_TABLET | Freq: Every day | ORAL | Status: DC

## 2022-12-27 MED ORDER — VANCOMYCIN HCL 1000 MG IV SOLR
INTRAVENOUS | Status: AC
  Filled 2022-12-27: qty 20

## 2022-12-27 MED ORDER — FENTANYL CITRATE (PF) 100 MCG/2ML IJ SOLN
25.0000 ug | INTRAMUSCULAR | Status: DC | PRN
  Administered 2022-12-27: 25 ug via INTRAVENOUS
  Administered 2022-12-27: 50 ug via INTRAVENOUS
  Administered 2022-12-27: 25 ug via INTRAVENOUS

## 2022-12-27 MED ORDER — PROMETHAZINE HCL 25 MG/ML IJ SOLN: 6.2500 mg | INTRAMUSCULAR | Status: DC | PRN

## 2022-12-27 MED ORDER — ONDANSETRON HCL 4 MG PO TABS
4.0000 mg | ORAL_TABLET | Freq: Four times a day (QID) | ORAL | Status: DC | PRN
  Filled 2022-12-27: qty 1

## 2022-12-27 MED ORDER — ACETAMINOPHEN 650 MG RE SUPP: 650.0000 mg | RECTAL | Status: DC | PRN

## 2022-12-27 MED ORDER — FENTANYL CITRATE (PF) 100 MCG/2ML IJ SOLN
INTRAMUSCULAR | Status: AC
  Filled 2022-12-27: qty 2

## 2022-12-27 MED ORDER — ROCURONIUM BROMIDE 10 MG/ML (PF) SYRINGE
PREFILLED_SYRINGE | INTRAVENOUS | Status: AC
  Filled 2022-12-27: qty 20

## 2022-12-27 MED ORDER — LIDOCAINE 2% (20 MG/ML) 5 ML SYRINGE
INTRAMUSCULAR | Status: DC | PRN
  Administered 2022-12-27: 60 mg via INTRAVENOUS

## 2022-12-27 MED ORDER — DEXAMETHASONE SODIUM PHOSPHATE 10 MG/ML IJ SOLN
INTRAMUSCULAR | Status: AC
  Filled 2022-12-27: qty 1

## 2022-12-27 MED ORDER — PANTOPRAZOLE SODIUM 40 MG PO TBEC: 40.0000 mg | DELAYED_RELEASE_TABLET | Freq: Every day | ORAL | Status: DC

## 2022-12-27 MED ORDER — SUGAMMADEX SODIUM 200 MG/2ML IV SOLN
INTRAVENOUS | Status: DC | PRN
  Administered 2022-12-27: 200 mg via INTRAVENOUS

## 2022-12-27 MED ORDER — 0.9 % SODIUM CHLORIDE (POUR BTL) OPTIME
TOPICAL | Status: DC | PRN
  Administered 2022-12-27: 1000 mL

## 2022-12-27 MED ORDER — LACTATED RINGERS IV SOLN: INTRAVENOUS | Status: DC

## 2022-12-27 MED ORDER — BUPIVACAINE HCL (PF) 0.25 % IJ SOLN
INTRAMUSCULAR | Status: AC
  Filled 2022-12-27: qty 30

## 2022-12-27 MED ORDER — METFORMIN HCL 500 MG PO TABS
1000.0000 mg | ORAL_TABLET | Freq: Two times a day (BID) | ORAL | Status: DC
  Administered 2022-12-27: 1000 mg via ORAL
  Filled 2022-12-27: qty 2

## 2022-12-27 MED ORDER — FLEET ENEMA 7-19 GM/118ML RE ENEM: 1.0000 | ENEMA | Freq: Once | RECTAL | Status: DC | PRN

## 2022-12-27 MED ORDER — MIDAZOLAM HCL 2 MG/2ML IJ SOLN
INTRAMUSCULAR | Status: AC
  Filled 2022-12-27: qty 2

## 2022-12-27 MED ORDER — PHENYLEPHRINE 80 MCG/ML (10ML) SYRINGE FOR IV PUSH (FOR BLOOD PRESSURE SUPPORT)
PREFILLED_SYRINGE | INTRAVENOUS | Status: AC
  Filled 2022-12-27: qty 10

## 2022-12-27 MED ORDER — ORAL CARE MOUTH RINSE: 15.0000 mL | Freq: Once | OROMUCOSAL | Status: AC

## 2022-12-27 MED ORDER — MENTHOL 3 MG MT LOZG: 1.0000 | LOZENGE | OROMUCOSAL | Status: DC | PRN

## 2022-12-27 MED ORDER — OXYCODONE HCL 5 MG/5ML PO SOLN: 5.0000 mg | Freq: Once | ORAL | Status: DC | PRN

## 2022-12-27 MED ORDER — HYDROCODONE-ACETAMINOPHEN 10-325 MG PO TABS: 1.0000 | ORAL_TABLET | ORAL | Status: DC | PRN

## 2022-12-27 MED ORDER — SODIUM CHLORIDE 0.9 % IV SOLN
250.0000 mL | INTRAVENOUS | Status: DC
  Administered 2022-12-27: 250 mL via INTRAVENOUS

## 2022-12-27 MED ORDER — AMISULPRIDE (ANTIEMETIC) 5 MG/2ML IV SOLN: 10.0000 mg | Freq: Once | INTRAVENOUS | Status: DC | PRN

## 2022-12-27 MED ORDER — CEFAZOLIN SODIUM-DEXTROSE 2-4 GM/100ML-% IV SOLN: 2.0000 g | INTRAVENOUS | Status: DC

## 2022-12-27 MED ORDER — FENOFIBRATE 160 MG PO TABS: 160.0000 mg | ORAL_TABLET | Freq: Every day | ORAL | Status: DC

## 2022-12-27 MED ORDER — ROCURONIUM BROMIDE 10 MG/ML (PF) SYRINGE
PREFILLED_SYRINGE | INTRAVENOUS | Status: DC | PRN
  Administered 2022-12-27: 20 mg via INTRAVENOUS
  Administered 2022-12-27: 50 mg via INTRAVENOUS

## 2022-12-27 MED ORDER — BISACODYL 10 MG RE SUPP: 10.0000 mg | Freq: Every day | RECTAL | Status: DC | PRN

## 2022-12-27 MED ORDER — DIAZEPAM 5 MG PO TABS
5.0000 mg | ORAL_TABLET | Freq: Four times a day (QID) | ORAL | Status: DC | PRN
  Administered 2022-12-27 – 2022-12-28 (×2): 5 mg via ORAL
  Filled 2022-12-27 (×2): qty 1

## 2022-12-27 MED ORDER — ONDANSETRON HCL 4 MG PO TABS: 8.0000 mg | ORAL_TABLET | Freq: Every day | ORAL | Status: DC

## 2022-12-27 MED ORDER — PHENYLEPHRINE HCL-NACL 20-0.9 MG/250ML-% IV SOLN
INTRAVENOUS | Status: DC | PRN
  Administered 2022-12-27: 25 ug/min via INTRAVENOUS

## 2022-12-27 MED ORDER — DEXAMETHASONE SODIUM PHOSPHATE 10 MG/ML IJ SOLN
INTRAMUSCULAR | Status: DC | PRN
  Administered 2022-12-27: 10 mg via INTRAVENOUS

## 2022-12-27 MED ORDER — CHLORHEXIDINE GLUCONATE 0.12 % MT SOLN: 15.0000 mL | Freq: Once | OROMUCOSAL | Status: AC

## 2022-12-27 MED ORDER — BUPIVACAINE HCL (PF) 0.25 % IJ SOLN
INTRAMUSCULAR | Status: DC | PRN
  Administered 2022-12-27: 20 mL

## 2022-12-27 MED ORDER — OXYCODONE HCL 5 MG PO TABS
10.0000 mg | ORAL_TABLET | ORAL | Status: DC | PRN
  Administered 2022-12-27 – 2022-12-28 (×4): 10 mg via ORAL
  Filled 2022-12-27 (×3): qty 2

## 2022-12-27 MED ORDER — ONDANSETRON HCL 4 MG/2ML IJ SOLN: 4.0000 mg | Freq: Four times a day (QID) | INTRAMUSCULAR | Status: DC | PRN

## 2022-12-27 MED ORDER — CEFAZOLIN SODIUM-DEXTROSE 2-4 GM/100ML-% IV SOLN
INTRAVENOUS | Status: AC
  Filled 2022-12-27: qty 100

## 2022-12-27 MED ORDER — THROMBIN 20000 UNITS EX SOLR
CUTANEOUS | Status: AC
  Filled 2022-12-27: qty 20000

## 2022-12-27 MED ORDER — LIDOCAINE 2% (20 MG/ML) 5 ML SYRINGE
INTRAMUSCULAR | Status: AC
  Filled 2022-12-27: qty 5

## 2022-12-27 MED ORDER — FUROSEMIDE 20 MG PO TABS: 20.0000 mg | ORAL_TABLET | Freq: Every day | ORAL | Status: DC

## 2022-12-27 SURGICAL SUPPLY — 67 items
ADH SKN CLS APL DERMABOND .7 (GAUZE/BANDAGES/DRESSINGS) ×1
APL SKNCLS STERI-STRIP NONHPOA (GAUZE/BANDAGES/DRESSINGS) ×1
BAG COUNTER SPONGE SURGICOUNT (BAG) ×1 IMPLANT
BAG DECANTER FOR FLEXI CONT (MISCELLANEOUS) ×1 IMPLANT
BAG SPNG CNTER NS LX DISP (BAG) ×1
BENZOIN TINCTURE PRP APPL 2/3 (GAUZE/BANDAGES/DRESSINGS) ×1 IMPLANT
BLADE BONE MILL MEDIUM (MISCELLANEOUS) ×1 IMPLANT
BLADE CLIPPER SURG (BLADE) IMPLANT
BUR CUTTER 7.0 ROUND (BURR) ×1 IMPLANT
BUR MATCHSTICK NEURO 3.0 LAGG (BURR) ×1 IMPLANT
CAGE EXP CATALYFT SHORT 9X22.5 (Cage) IMPLANT
CAGE INTERBODY PL SHT 7X22.5 (Plate) IMPLANT
CANISTER SUCT 3000ML PPV (MISCELLANEOUS) ×1 IMPLANT
CATH FOLEY 2WAY SLVR  5CC 12FR (CATHETERS) ×1
CATH FOLEY 2WAY SLVR 5CC 12FR (CATHETERS) IMPLANT
CNTNR URN SCR LID CUP LEK RST (MISCELLANEOUS) ×1 IMPLANT
CONT SPEC 4OZ STRL OR WHT (MISCELLANEOUS) ×1
COVER BACK TABLE 60X90IN (DRAPES) ×1 IMPLANT
DERMABOND ADVANCED .7 DNX12 (GAUZE/BANDAGES/DRESSINGS) ×1 IMPLANT
DRAPE C-ARM 42X72 X-RAY (DRAPES) ×2 IMPLANT
DRAPE HALF SHEET 40X57 (DRAPES) IMPLANT
DRAPE LAPAROTOMY 100X72X124 (DRAPES) ×1 IMPLANT
DRAPE SURG 17X23 STRL (DRAPES) ×4 IMPLANT
DRSG OPSITE POSTOP 4X6 (GAUZE/BANDAGES/DRESSINGS) ×1 IMPLANT
DURAPREP 26ML APPLICATOR (WOUND CARE) ×1 IMPLANT
ELECT REM PT RETURN 9FT ADLT (ELECTROSURGICAL) ×1
ELECTRODE REM PT RTRN 9FT ADLT (ELECTROSURGICAL) ×1 IMPLANT
EVACUATOR 1/8 PVC DRAIN (DRAIN) IMPLANT
GAUZE 4X4 16PLY ~~LOC~~+RFID DBL (SPONGE) IMPLANT
GAUZE SPONGE 4X4 12PLY STRL (GAUZE/BANDAGES/DRESSINGS) IMPLANT
GLOVE BIO SURGEON STRL SZ 6.5 (GLOVE) ×1 IMPLANT
GLOVE BIOGEL PI IND STRL 6.5 (GLOVE) ×1 IMPLANT
GLOVE BIOGEL PI IND STRL 8 (GLOVE) IMPLANT
GLOVE ECLIPSE 9.0 STRL (GLOVE) ×2 IMPLANT
GOWN STRL REIN 3XL LVL4 (GOWN DISPOSABLE) IMPLANT
GOWN STRL REUS W/ TWL LRG LVL3 (GOWN DISPOSABLE) IMPLANT
GOWN STRL REUS W/ TWL XL LVL3 (GOWN DISPOSABLE) ×2 IMPLANT
GOWN STRL REUS W/TWL 2XL LVL3 (GOWN DISPOSABLE) IMPLANT
GOWN STRL REUS W/TWL LRG LVL3 (GOWN DISPOSABLE)
GOWN STRL REUS W/TWL XL LVL3 (GOWN DISPOSABLE) ×2
KIT BASIN OR (CUSTOM PROCEDURE TRAY) ×1 IMPLANT
KIT TURNOVER KIT B (KITS) ×1 IMPLANT
NEEDLE HYPO 22GX1.5 SAFETY (NEEDLE) ×1 IMPLANT
NS IRRIG 1000ML POUR BTL (IV SOLUTION) ×1 IMPLANT
PACK LAMINECTOMY NEURO (CUSTOM PROCEDURE TRAY) ×1 IMPLANT
PUTTY GRAFTON DBF 6CC W/DELIVE (Putty) IMPLANT
ROD 120MM (Rod) ×2 IMPLANT
ROD SPNL 120X5.5XNS TI RDS (Rod) IMPLANT
SCREW PA EVEREST 5.5X35 (Screw) IMPLANT
SCREW PA EVEREST 5.5X40 (Screw) IMPLANT
SCREW PA THRD 6.5X35 (Screw) IMPLANT
SCREW POLYAXIAL 5.5X45MM (Screw) IMPLANT
SET SCREW (Screw) ×6 IMPLANT
SET SCREW VRST (Screw) IMPLANT
SOL ELECTROSURG ANTI STICK (MISCELLANEOUS) ×1
SOLUTION ELECTROSURG ANTI STCK (MISCELLANEOUS) ×1 IMPLANT
SPIKE FLUID TRANSFER (MISCELLANEOUS) ×1 IMPLANT
SPONGE SURGIFOAM ABS GEL 100 (HEMOSTASIS) ×1 IMPLANT
STRIP CLOSURE SKIN 1/2X4 (GAUZE/BANDAGES/DRESSINGS) ×2 IMPLANT
SUT VIC AB 0 CT1 18XCR BRD8 (SUTURE) ×2 IMPLANT
SUT VIC AB 0 CT1 8-18 (SUTURE) ×1
SUT VIC AB 2-0 CT1 18 (SUTURE) ×1 IMPLANT
SUT VIC AB 3-0 SH 8-18 (SUTURE) ×2 IMPLANT
TOWEL GREEN STERILE (TOWEL DISPOSABLE) ×1 IMPLANT
TOWEL GREEN STERILE FF (TOWEL DISPOSABLE) ×1 IMPLANT
TRAY FOLEY MTR SLVR 16FR STAT (SET/KITS/TRAYS/PACK) ×1 IMPLANT
WATER STERILE IRR 1000ML POUR (IV SOLUTION) ×1 IMPLANT

## 2022-12-27 NOTE — Op Note (Signed)
Date of procedure: 12/27/2022  Date of dictation: Same  Service: Neurosurgery  Preoperative diagnosis: L4-5 grade 1 degenerative spondylolisthesis with severe stenosis and radiculopathy, L5-S1 large central disc herniation with severe stenosis.  Postoperative diagnosis: Same  Procedure Name: Bilateral L4-5 and L5-S1 decompressive laminotomies and foraminotomies, more than would be for simple interbody fusion.  L4-5, L5-S1 posterior lumbar interbody fusion utilizing interbody cages, locally harvested autograft, and morselized allograft  L4-5, L5-S1 posterior lateral arthrodesis utilizing local autograft and segmental pedicle screw fixation  Surgeon:Talena Neira A.Maryjane Benedict, M.D.  Asst. Surgeon: Reinaldo Meeker, NP  Anesthesia: General  Indication: 57 year old female with severe back and bilateral lower extremity symptoms failing conservative manage.  Workup demonstrates evidence of an L4-5 degenerative spondylolisthesis with a superiorly migrated disc fragment and severe stenosis with marked accompanying facet arthropathy.  At L5-S1 the patient has evidence of marked disc generation with a large paracentral disc protrusion and severe stenosis and ongoing thecal sac and nerve root compression.  Patient presents now for two-level lumbar decompression and fusion in hopes of improving her symptoms.  Operative note: After induction of anesthesia, patient positioned prone on the Wilson frame and properly padded.  Lumbar region prepped and draped sterilely.  Incision made overlying L4-5 and S1.  Dissection performed bilaterally.  Retractor placed.  Fluoroscopy used.  Levels confirmed.  Decompressive laminotomies and facetectomies were then performed using Leksell rongeurs, Kerrison rongeurs and the high-speed drill to remove the inferior two thirds of the lamina of L4 and L5 the entire inferior facet of L4 and L5 the superior facet of L5 and S1 and the superior aspect of the lamina of L5 and S1 were removed.  Ligamentum  flavum elevated and resected.  Foraminotomies complete on the course of the exiting L4-L5 and S1 nerve roots bilaterally.  Bilateral discectomy was performed at L4-5 and L5-S1.  Disc base was then prepared for interbody fusion.  With distractors placed patient's right side disc base was cleaned on the left side.  9 mm Medtronic expandable cage was then impacted in place on the left at L4-5 7 mm cage was impacted in place on the left and L5-S1.  Distractors removed patient's right side.  Disc bases prepared on the right side.  Morselized autograft packed in the interspace.  Second cage was then impacted into place and expanded under fluoroscopic visualization.  Pedicles of L4-5 and S1 were identified using surface landmarks and intraoperative fluoroscopy.  Superficial bone around the pedicle was then removed and high-speed drill.  Pedicle was then probed using a pedicle awl.  Each pedicle tract was then probed and found to be solidly within the bone.  Each pedicle tract was then tapped with a screw tap.  Screw triple was probed and found to be solidly within the bone.  5.5 mm Everest brand screws from [employer] placed bilaterally at L4-5 and S1 however on the right sided S1 is 6.5 millimeter screw was placed.  Transverse processes and sacral ala were decorticated.  Morselized autograft was packed posterolaterally.  Final images reveal good fusion cage and the hardware  Proper operative level with normal alignment of the spine.  Short segment of titanium rod placed for the screws at L4-5 and S1.  Locking caps placed over the screws.  Locking caps then engaged with the construct under compression.  Gelfoam was placed over the laminotomy defects after each cage been packed with demineralized bone fibers.  Vancomycin powder was placed in the deep wound space.  Wounds and closed in layers with Vicryl  sutures.  Steri-Strips and sterile dressing were applied.  No apparent complications.  Patient tolerated the  procedure well and he she returns to the recovery room postop.

## 2022-12-27 NOTE — Anesthesia Preprocedure Evaluation (Addendum)
Anesthesia Evaluation  Patient identified by MRN, date of birth, ID band Patient awake    Reviewed: Allergy & Precautions, NPO status , Patient's Chart, lab work & pertinent test results  Airway Mallampati: II  TM Distance: >3 FB Neck ROM: Full    Dental no notable dental hx.    Pulmonary former smoker   Pulmonary exam normal        Cardiovascular negative cardio ROS Normal cardiovascular exam     Neuro/Psych  Headaches PSYCHIATRIC DISORDERS Anxiety Depression       GI/Hepatic negative GI ROS, Neg liver ROS,,,  Endo/Other  diabetes, Insulin Dependent, Oral Hypoglycemic Agents  Patient on GLP-1 Agonist. Last dose 3.6.24  Renal/GU Renal InsufficiencyRenal disease     Musculoskeletal  (+) Arthritis ,    Abdominal   Peds  Hematology negative hematology ROS (+)   Anesthesia Other Findings Spondylolisthesis  Reproductive/Obstetrics                             Anesthesia Physical Anesthesia Plan  ASA: 3  Anesthesia Plan: General   Post-op Pain Management:    Induction: Intravenous  PONV Risk Score and Plan: 3 and Ondansetron, Dexamethasone, Midazolam and Treatment may vary due to age or medical condition  Airway Management Planned: Oral ETT  Additional Equipment:   Intra-op Plan:   Post-operative Plan: Extubation in OR  Informed Consent: I have reviewed the patients History and Physical, chart, labs and discussed the procedure including the risks, benefits and alternatives for the proposed anesthesia with the patient or authorized representative who has indicated his/her understanding and acceptance.     Dental advisory given  Plan Discussed with: CRNA  Anesthesia Plan Comments:        Anesthesia Quick Evaluation

## 2022-12-27 NOTE — Anesthesia Procedure Notes (Signed)
Procedure Name: Intubation Date/Time: 12/27/2022 11:08 AM  Performed by: Dorann Lodge, CRNAPre-anesthesia Checklist: Patient identified, Emergency Drugs available, Suction available and Patient being monitored Patient Re-evaluated:Patient Re-evaluated prior to induction Oxygen Delivery Method: Circle System Utilized Preoxygenation: Pre-oxygenation with 100% oxygen Induction Type: IV induction Ventilation: Mask ventilation without difficulty Laryngoscope Size: Mac and 3 Grade View: Grade III Tube type: Oral Tube size: 7.0 mm Number of attempts: 1 Airway Equipment and Method: Stylet Placement Confirmation: ETT inserted through vocal cords under direct vision, positive ETCO2 and breath sounds checked- equal and bilateral Secured at: 22 cm Tube secured with: Tape Dental Injury: Teeth and Oropharynx as per pre-operative assessment

## 2022-12-27 NOTE — Progress Notes (Signed)
Orthopedic Tech Progress Note Patient Details:  Gloria Hodge May 28, 1966 [id number]  Ortho Devices Type of Ortho Device: Lumbar corsett Ortho Device/Splint Interventions: Ordered      [place] 12/27/2022, 7:39 PM

## 2022-12-27 NOTE — H&P (Signed)
Gloria Hodge is an 57 y.o. female.   Chief Complaint: Back pain   HPI: 57 year old female with progressively worsening lumbar pain with radiation to both lower extremities failing conservative management.  Workup demonstrates evidence of degenerative spondylolisthesis with severe stenosis at L4-5 and a large paracentral disc herniation at L5-S1 also causing severe stenosis.  Patient has failed conservative management and presents now for two-level lumbar decompression and fusion.  Past Medical History:  Diagnosis Date   Arthritis    hands   Calf pain 01/2020   Depression    Diabetes ([place])    Diabetic retinopathy associated with controlled type 2 diabetes mellitus ([place])    Dizziness    Fatigue    resolved taking meds for sleep   Fatty liver    Frequent headaches    History of kidney stones    IBS (irritable bowel syndrome)    resolved   Malignant neoplasm of rectum ([place])    Malignant neoplasm of rectum ([facility])    Memory loss    from chemo   Muscle pain    Swelling     Past Surgical History:  Procedure Laterality Date   ABDOMINAL HYSTERECTOMY     BLADDER SURGERY     BREAST SURGERY     reduction   carpel tunnel surgery     right hand   EYE SURGERY Left 11/29/2019   for diabetic retinopathy   FLEXIBLE SIGMOIDOSCOPY  06/21/2020   Procedure: FLEXIBLE SIGMOIDOSCOPY;  Surgeon: Leighton Ruff, MD;  Location: [facility];  Service: General;;   HERNIA REPAIR     KNEE SURGERY     left   PORT-A-CATH REMOVAL  2023   PORTACATH PLACEMENT  12/2019   TOE AMPUTATION Bilateral 2018-2020   great and 3rd toes   XI ROBOTIC ASSISTED LOWER ANTERIOR RESECTION N/A 06/21/2020   Procedure: XI ROBOTIC ASSISTED LOWER ANTERIOR RESECTION;  Surgeon: Leighton Ruff, MD;  Location: [facility];  Service: General;  Laterality: N/A;    History reviewed. No pertinent family history. Social History:  reports that she has quit smoking. She has never used smokeless tobacco. She reports that she does  not drink alcohol and does not use drugs.  Allergies:  Allergies  Allergen Reactions   Morphine Nausea Only   Tramadol Hcl Nausea And Vomiting   Bactrim [Sulfamethoxazole-Trimethoprim] Rash    Medications Prior to Admission  Medication Sig Dispense Refill   alendronate (FOSAMAX) 70 MG tablet Take 70 mg by mouth once a week. Take with a full glass of water on an empty stomach.     cyclobenzaprine (FLEXERIL) 10 MG tablet Take 10 mg by mouth at bedtime.     fenofibrate (TRICOR) 145 MG tablet Take 145 mg by mouth daily.     furosemide (LASIX) 20 MG tablet Take 20 mg by mouth daily.     Glucagon (GVOKE HYPOPEN 2-PACK) 1 MG/0.2ML SOAJ Inject 1 mg into the skin daily as needed (low blood sugar).     metFORMIN (GLUCOPHAGE) 1000 MG tablet Take 1,000 mg by mouth 2 (two) times daily.     ondansetron (ZOFRAN) 8 MG tablet Take 8 mg by mouth daily.     Oxycodone HCl 20 MG TABS Take 20 mg by mouth in the morning and at bedtime.     pantoprazole (PROTONIX) 40 MG tablet Take 40 mg by mouth daily.     rosuvastatin (CRESTOR) 5 MG tablet Take 5 mg by mouth daily.     tirzepatide (MOUNJARO) 12.5 MG/0.5ML Pen Inject  12.5 mg into the skin once a week.     TRESIBA FLEXTOUCH 100 UNIT/ML SOPN FlexTouch Pen Inject 25 Units into the skin daily.     zolpidem (AMBIEN) 10 MG tablet Take 10 mg by mouth at bedtime.     Continuous Blood Gluc Receiver (FREESTYLE LIBRE 14 DAY READER) DEVI      Continuous Blood Gluc Sensor (FREESTYLE LIBRE 14 DAY SENSOR) MISC      Glucose Blood (BLOOD GLUCOSE TEST STRIPS) STRP See admin instructions.     Insulin Pen Needle (B-D ULTRAFINE III SHORT PEN) 31G X 8 MM MISC See admin instructions.     polyethylene glycol-electrolytes (NULYTELY) 420 g solution MIX AND DRINK AS DIRECTED     prochlorperazine (COMPAZINE) 10 MG tablet Take 1 tablet (10 mg total) by mouth every 6 (six) hours as needed for nausea or vomiting. (Patient not taking: Reported on 12/20/2022) 30 tablet 2    Results for  orders placed or performed during the hospital encounter of 12/27/22 (from the past 48 hour(s))  Glucose, capillary     Status: Abnormal   Collection Time: 12/27/22  9:15 AM  Result Value Ref Range   Glucose-Capillary 101 (H) 70 - 99 mg/dL    Comment: Glucose reference range applies only to samples taken after fasting for at least 8 hours.   No results found.  Pertinent items noted in HPI and remainder of comprehensive ROS otherwise negative.  Blood pressure (!) 155/77, pulse 88, temperature 98.3 F (36.8 C), temperature source Oral, resp. rate 18, height 5\' 2"  (1.575 m), weight 65.8 kg, SpO2 100 %.  Patient is awake and alert.  She is oriented and appropriate.  Speech is fluent.  Judgment insight are intact.  Cranial nerve function normal bilateral.  Motor examination reveals mildly weak in dorsiflexion bilaterally.  Straight raising is positive on the right equivocal on the left.  Sensory examination with decrease sensation pinprick and light touch in her right L5 and S1 dermatomes.  Deep tendon rhexis are normal active septa her Achilles reflexes are absent.  Gait is antalgic.  Posture is mildly kyphotic peer examination head ears eyes nose and throat is unremarked.  Chest and abdomen are benign.  Extremities are free from injury deformity Assessment/Plan L4-5 severe stenosis secondary to degenerative spondylolisthesis and facet arthropathy.  L5-S1 large central disc herniation with facet arthropathy.  Plan bilateral L4-5 and L5-S1 decompressive laminotomies and foraminotomies followed by posterior lumbar MRI fusion utilizing interbody cages, THAT autograft, and augmented with posterior load arthrodesis utilizing segmental pedicle screw fixation and local autograft.  Risks and benefits been explained.  Patient wishes to proceed.  Mallie Mussel A Caster [place] 12/27/2022, 10:08 AM

## 2022-12-27 NOTE — Transfer of Care (Signed)
Immediate Anesthesia Transfer of Care Note  Patient: Gloria Hodge  Procedure(s) Performed: Posterior Lumbar Interbody Fusion - Lumbar Four-Lumbar Five - Lumbar Five-Sacral One (Back)  Patient Location: PACU  Anesthesia Type:General  Level of Consciousness: awake and alert   Airway & Oxygen Therapy: Patient Spontanous Breathing  Post-op Assessment: Report given to RN and Post -op Vital signs reviewed and stable  Post vital signs: Reviewed and stable  Last Vitals:  Vitals Value Taken Time  BP 124/59 12/27/22 1411  Temp    Pulse 87 12/27/22 1413  Resp 14 12/27/22 1413  SpO2 98 % 12/27/22 1413  Vitals shown include unvalidated device data.  Last Pain:  Vitals:   12/27/22 0925  TempSrc:   PainSc: 0-No pain         Complications: No notable events documented.

## 2022-12-27 NOTE — Brief Op Note (Signed)
12/27/2022  2:02 PM  PATIENT:  Gloria Hodge  57 y.o. female  PRE-OPERATIVE DIAGNOSIS:  Spondylolisthesis  POST-OPERATIVE DIAGNOSIS:  Spondylolisthesis  PROCEDURE:  Procedure(s): Posterior Lumbar Interbody Fusion - Lumbar Four-Lumbar Five - Lumbar Five-Sacral One (N/A)  SURGEON:  Surgeon(s) and Role:    * Earnie Larsson, MD - Primary  PHYSICIAN ASSISTANT:   ASSISTANTS: Bergman,Np   ANESTHESIA:   general  EBL:  200 mL   BLOOD ADMINISTERED:none  DRAINS: none   LOCAL MEDICATIONS USED:  MARCAINE     SPECIMEN:  No Specimen  DISPOSITION OF SPECIMEN:  N/A  COUNTS:  YES  TOURNIQUET:  * No tourniquets in log *  DICTATION: .Dragon Dictation  PLAN OF CARE: Admit for overnight observation  PATIENT DISPOSITION:  PACU - hemodynamically stable.   Delay start of Pharmacological VTE agent (>24hrs) due to surgical blood loss or risk of bleeding: yes

## 2022-12-28 ENCOUNTER — Other Ambulatory Visit: Payer: Self-pay

## 2022-12-28 LAB — GLUCOSE, CAPILLARY: Glucose-Capillary: 101 mg/dL — ABNORMAL HIGH (ref 70–99)

## 2022-12-28 MED ORDER — METHOCARBAMOL 750 MG PO TABS: 750.0000 mg | ORAL_TABLET | Freq: Four times a day (QID) | ORAL | 0 refills | Status: AC

## 2022-12-28 NOTE — Discharge Instructions (Signed)
Wound Care Keep incision covered and dry for three days.   Do not put any creams, lotions, or ointments on incision. Leave steri-strips on back.  They will fall off by themselves. Activity Walk each and every day, increasing distance each day. No lifting greater than 5 lbs.  Avoid excessive neck motion. No driving for 2 weeks; may ride as a passenger locally. If provided with back brace, wear when out of bed.  It is not necessary to wear brace in bed. Diet Resume your normal diet.   Call Your Doctor If Any of These Occur Redness, drainage, or swelling at the wound.  Temperature greater than 101 degrees. Severe pain not relieved by pain medication. Incision starts to come apart. Follow Up Appt Call [phone]) for problems.  If you have any hardware placed in your spine, you will need an x-ray before your appointment., henry

## 2022-12-28 NOTE — Progress Notes (Signed)
Patient is discharged from room 3C09 at this time. Alert and in stable condition. IV site d/c'd and instructions read to patient with understanding verbalized and all questions answered. Left unit via wheelchair with all belongings at side. 

## 2022-12-28 NOTE — Evaluation (Signed)
Occupational Therapy Evaluation Patient Details Name: Gloria Hodge MRN: [id number] DOB: 08-06-1966 Today's Date: 12/28/2022   History of Present Illness Pt is a 57 y.o. female s/p PLIF L4-S1. PMH significant for arthritis, diabetes, diabetic retinopathy, IBS, Malignant neoplasm of rectum, L knee surgery.   Clinical Impression   PTA, pt lived with her son and brother and was independent in ADL, IADL, and driving. Upon eval, pt performing LB and OOB ADL at min guard A due to pain and decr balance. Pt educated and demonstrating use of compensatory techniques for bed mobility, LB Adl, brace application, tub shower transfers, grooming, and toileting. Recommending discharge home with son to assist as needed. Will follow acutely to optimize functional independence, but do not anticipate need for follow up OT.      Recommendations for follow up therapy are one component of a multi-disciplinary discharge planning process, led by the attending physician.  Recommendations may be updated based on patient status, additional functional criteria and insurance authorization.   Follow Up Recommendations  No OT follow up     Assistance Recommended at Discharge Intermittent Supervision/Assistance  Patient can return home with the following A little help with walking and/or transfers;A little help with bathing/dressing/bathroom;Assistance with cooking/housework;Assist for transportation;Help with stairs or ramp for entrance    Functional Status Assessment  Patient has had a recent decline in their functional status and demonstrates the ability to make significant improvements in function in a reasonable and predictable amount of time.  Equipment Recommendations  BSC/3in1    Recommendations for Other Services PT consult     Precautions / Restrictions Precautions Precautions: Back Precaution Booklet Issued: Yes (comment) Precaution Comments: All precautions reviewed within the context of  ADL Required Braces or Orthoses: Spinal Brace Spinal Brace: Lumbar corset;Applied in sitting position Restrictions Weight Bearing Restrictions: No      Mobility Bed Mobility Overal bed mobility: Needs Assistance Bed Mobility: Rolling, Sit to Sidelying, Sidelying to Sit Rolling: Min guard Sidelying to sit: Min guard     Sit to sidelying: Min guard General bed mobility comments: for safety. Min cues for technique    Transfers Overall transfer level: Needs assistance Equipment used: None Transfers: Sit to/from Stand Sit to Stand: Min guard           General transfer comment: Increased time and effort to rise      Balance Overall balance assessment: Needs assistance Sitting-balance support: Single extremity supported, Bilateral upper extremity supported, No upper extremity supported, Feet supported Sitting balance-Leahy Scale: Fair Sitting balance - Comments: Able to achieve figure 4 with increased time and effort; BUE bracing for balance   Standing balance support: No upper extremity supported, During functional activity Standing balance-Leahy Scale: Fair Standing balance comment: min guard A for short distance                           ADL either performed or assessed with clinical judgement   ADL Overall ADL's : Needs assistance/impaired Eating/Feeding: Sitting;Modified independent   Grooming: Min guard;Standing   Upper Body Bathing: Set up;Sitting   Lower Body Bathing: Min guard;Sit to/from stand   Upper Body Dressing : Set up;Sitting   Lower Body Dressing: Min guard;Sit to/from stand   Toilet Transfer: Min guard;Ambulation;Regular Glass blower/designer Details (indicate cue type and reason): incr time and effort up from low EOB   Toileting - Clothing Manipulation Details (indicate cue type and reason): reviewed compensatory techniques Tub/ Shower Transfer: Tub  transfer;Min guard;Cueing for sequencing;Adhering to back  precautions;Ambulation;BSC/3in1   Functional mobility during ADLs: Min guard;Minimal assistance General ADL Comments: one bout of Min A, pt closing eyes in pain standing in room and when went to step, LOB     Vision Baseline Vision/History: 1 Wears glasses;5 Retinopathy Ability to See in Adequate Light: 1 Impaired Patient Visual Report: No change from baseline Additional Comments: WFL for tasks assessed with reading glasses     Perception     Praxis      Pertinent Vitals/Pain Pain Assessment Pain Assessment: Faces Faces Pain Scale: Hurts even more Pain Location: operative site, anterior thighs Pain Descriptors / Indicators: Aching, Sore, Operative site guarding Pain Intervention(s): Limited activity within patient's tolerance, Monitored during session, Repositioned     Hand Dominance     Extremity/Trunk Assessment Upper Extremity Assessment Upper Extremity Assessment: Overall WFL for tasks assessed   Lower Extremity Assessment Lower Extremity Assessment: Defer to PT evaluation   Cervical / Trunk Assessment Cervical / Trunk Assessment: Back Surgery   Communication Communication Communication: No difficulties   Cognition Arousal/Alertness: Awake/alert Behavior During Therapy: WFL for tasks assessed/performed Overall Cognitive Status: Within Functional Limits for tasks assessed                                 General Comments: able to recall precautions     General Comments  VSS. Son able to help at home    Exercises     Shoulder Instructions      Home Living Family/patient expects to be discharged to:: Private residence Living Arrangements: Children;Other relatives (brother) Available Help at Discharge: Family;Available 24 hours/day (son recently quit job and will initially be available  24/7) Type of Home: House Home Access: Stairs to enter [employer] of Steps: 3 Entrance Stairs-Rails:  (Pt with difficulty reporting, but thinks  she has rails bilaterally) Home Layout: One level     Bathroom Shower/Tub: Teacher, early years/pre: Standard     Home Equipment: Cane - single point          Prior Functioning/Environment Prior Level of Function : Independent/Modified Independent;Driving             Mobility Comments: no AD ADLs Comments: Indep in ADL and IADL, driving        OT Problem List: Decreased strength;Decreased activity tolerance;Impaired balance (sitting and/or standing);Decreased safety awareness;Decreased knowledge of use of DME or AE;Decreased knowledge of precautions;Pain      OT Treatment/Interventions: Self-care/ADL training;DME and/or AE instruction;Balance training;Patient/family education;Therapeutic activities    OT Goals(Current goals can be found in the care plan section) Acute Rehab OT Goals Patient Stated Goal: decrease pain OT Goal Formulation: With patient Time For Goal Achievement: 01/11/23 Potential to Achieve Goals: Good  OT Frequency: Min 2X/week    Co-evaluation              AM-PAC OT "6 Clicks" Daily Activity     Outcome Measure Help from another person eating meals?: None Help from another person taking care of personal grooming?: A Little Help from another person toileting, which includes using toliet, bedpan, or urinal?: A Little Help from another person bathing (including washing, rinsing, drying)?: A Little Help from another person to put on and taking off regular upper body clothing?: A Little Help from another person to put on and taking off regular lower body clothing?: A Little 6 Click Score: 19   End of  Session Equipment Utilized During Treatment: Gait belt;Back brace Nurse Communication: Mobility status  Activity Tolerance: Patient tolerated treatment well Patient left: in bed;with call bell/phone within reach  OT Visit Diagnosis: Unsteadiness on feet (R26.81);Muscle weakness (generalized) (M62.81);Other abnormalities of gait and  mobility (R26.89);Pain Pain - part of body:  (back)                Time: [id number] OT Time Calculation (min): 22 min Charges:  OT General Charges $OT Visit: 1 Visit OT Evaluation $OT Eval Low Complexity: 1 Low  Elder Cyphers, OTR/L [facility] Acute Rehabilitation Office: [phone]   Magnus Ivan 12/28/2022, 9:05 AM

## 2022-12-28 NOTE — Anesthesia Postprocedure Evaluation (Signed)
Anesthesia Post Note  Patient: Gloria Hodge  Procedure(s) Performed: Posterior Lumbar Interbody Fusion - Lumbar Four-Lumbar Five - Lumbar Five-Sacral One (Back)     Patient location during evaluation: PACU Anesthesia Type: General Level of consciousness: awake Pain management: pain level controlled Vital Signs Assessment: post-procedure vital signs reviewed and stable Respiratory status: spontaneous breathing, nonlabored ventilation and respiratory function stable Cardiovascular status: blood pressure returned to baseline and stable Postop Assessment: no apparent nausea or vomiting Anesthetic complications: no   No notable events documented.  Last Vitals:  Vitals:   12/27/22 2311 12/28/22 0356  BP: 138/70 119/64  Pulse: (!) 108 (!) 106  Resp: 18 18  Temp: 37.6 C 37.7 C  SpO2: 100% 97%    Last Pain:  Vitals:   12/28/22 0356  TempSrc: Oral  PainSc:                  Gloria Hodge P Edan Serratore

## 2022-12-28 NOTE — Care Management (Signed)
Patient with order to DC to home today. Unit staff to provide DME needed for home.   No HH needs identified Patient will have family/ friends provide transportation home. No other TOC needs identified for DC 

## 2022-12-28 NOTE — Discharge Summary (Signed)
Physician Discharge Summary  Patient ID: Gloria Hodge MRN: [id number] DOB/AGE: 1965-11-09 57 y.o. Estimated body mass index is 26.52 kg/m as calculated from the following:   Height as of this encounter: 5\' 2"  (1.575 m).   Weight as of this encounter: 65.8 kg.   Admit date: 12/27/2022 Discharge date: 12/28/2022  Admission Diagnoses: Degenerative disc disease lumbar spinal stenosis  Discharge Diagnoses: Same Principal Problem:   Degenerative spondylolisthesis   Discharged Condition: good  Hospital Course: Patient admitted to hospital underwent lumbar decompression fusion postoperative patient did very well with covering the floor on the floor was ambulating and voiding spontaneously tolerating regular diet and stable for discharge home.  Patient will be discharged scheduled follow-up in 1 to 2 weeks.  Consults: Significant Diagnostic Studies: Treatments: PLIF L45,L5-S1 Discharge Exam: Blood pressure (!) 145/62, pulse (!) 107, temperature 100 F (37.8 C), temperature source Oral, resp. rate 20, height 5\' 2"  (1.575 m), weight 65.8 kg, SpO2 100 %. Strength out of 5 wound clean dry and intact  Disposition: Home   Allergies as of 12/28/2022       Reactions   Morphine Nausea Only   Tramadol Hcl Nausea And Vomiting   Bactrim [sulfamethoxazole-trimethoprim] Rash        Medication List     TAKE these medications    alendronate 70 MG tablet Commonly known as: FOSAMAX Take 70 mg by mouth once a week. Take with a full glass of water on an empty stomach.   B-D ULTRAFINE III SHORT PEN 31G X 8 MM Misc Generic drug: Insulin Pen Needle See admin instructions.   BLOOD GLUCOSE TEST STRIPS Strp See admin instructions.   cyclobenzaprine 10 MG tablet Commonly known as: FLEXERIL Take 10 mg by mouth at bedtime.   fenofibrate 145 MG tablet Commonly known as: TRICOR Take 145 mg by mouth daily.   FreeStyle Libre 14 Day Reader Kerrin Mo   FreeStyle Libre 14 Day  Sensor Misc   furosemide 20 MG tablet Commonly known as: LASIX Take 20 mg by mouth daily.   Gvoke HypoPen 2-Pack 1 MG/0.2ML Soaj Generic drug: Glucagon Inject 1 mg into the skin daily as needed (low blood sugar).   metFORMIN 1000 MG tablet Commonly known as: GLUCOPHAGE Take 1,000 mg by mouth 2 (two) times daily.   methocarbamol 750 MG tablet Commonly known as: Robaxin-750 Take 1 tablet (750 mg total) by mouth 4 (four) times daily.   ondansetron 8 MG tablet Commonly known as: ZOFRAN Take 8 mg by mouth daily.   Oxycodone HCl 20 MG Tabs Take 20 mg by mouth in the morning and at bedtime.   pantoprazole 40 MG tablet Commonly known as: PROTONIX Take 40 mg by mouth daily.   polyethylene glycol-electrolytes 420 g solution Commonly known as: NuLYTELY MIX AND DRINK AS DIRECTED   prochlorperazine 10 MG tablet Commonly known as: COMPAZINE Take 1 tablet (10 mg total) by mouth every 6 (six) hours as needed for nausea or vomiting.   rosuvastatin 5 MG tablet Commonly known as: CRESTOR Take 5 mg by mouth daily.   tirzepatide 12.5 MG/0.5ML Pen Commonly known as: MOUNJARO Inject 12.5 mg into the skin once a week.   Tyler Aas FlexTouch 100 UNIT/ML FlexTouch Pen Generic drug: insulin degludec Inject 25 Units into the skin daily.   zolpidem 10 MG tablet Commonly known as: AMBIEN Take 10 mg by mouth at bedtime.               Durable Medical Equipment  (From admission, onward)  Start     Ordered   12/27/22 1624  DME Walker rolling  Once       Question:  Patient needs a walker to treat with the following condition  Answer:  Degenerative spondylolisthesis   12/27/22 1623   12/27/22 1624  DME 3 n 1  Once        12/27/22 1623             Signed: Elaina Hoops 12/28/2022, 8:43 AM

## 2022-12-28 NOTE — Evaluation (Signed)
Physical Therapy Evaluation Patient Details Name: Gloria Hodge MRN: [id number] DOB: [birth year] Today's Date: 12/28/2022  History of Present Illness  Pt is a 57 y.o. female s/p PLIF L4-S1. PMH significant for arthritis, diabetes, diabetic retinopathy, IBS, Malignant neoplasm of rectum, L knee surgery.  Clinical Impression  Patient is s/p above surgery resulting in the deficits listed below (see PT Problem List). Pt with increased pain in LLE this session and mild unsteadiness during gait. Requiring HHA and min guard for safety throughout gait and stair navigation. Educated about RW, however, pt reports she prefers to use her cane and does not want RW. Has family to assist at d/c. Reviewed back precautions and generalized walking program to perform at home. Would benefit from outpatient PT once cleared by MD. Patient will benefit from skilled PT to increase their independence and safety with mobility (while adhering to their precautions) to allow discharge to the venue listed below.        Recommendations for follow up therapy are one component of a multi-disciplinary discharge planning process, led by the attending physician.  Recommendations may be updated based on patient status, additional functional criteria and insurance authorization.  Follow Up Recommendations Other (comment) (would benefit from outpatient PT once cleared by MD)      Assistance Recommended at Discharge Intermittent Supervision/Assistance  Patient can return home with the following  A little help with walking and/or transfers;A little help with bathing/dressing/bathroom;Assistance with cooking/housework;Help with stairs or ramp for entrance    Equipment Recommendations None recommended by PT (pt refusing RW)  Recommendations for Other Services       Functional Status Assessment Patient has had a recent decline in their functional status and demonstrates the ability to make significant improvements in  function in a reasonable and predictable amount of time.     Precautions / Restrictions Precautions Precautions: Back Precaution Booklet Issued: Yes (comment) Precaution Comments: Pt able to recall 2/3 precautions from OT session. Required cues for no lifting. Required Braces or Orthoses: Spinal Brace Spinal Brace: Lumbar corset;Applied in sitting position Restrictions Weight Bearing Restrictions: No      Mobility  Bed Mobility Overal bed mobility: Needs Assistance Bed Mobility: Rolling, Sit to Sidelying, Sidelying to Sit Rolling: Supervision Sidelying to sit: Supervision     Sit to sidelying: Supervision General bed mobility comments: Able to recall log roll technique. Supervision for safety and increased time required secondary to pain.    Transfers Overall transfer level: Needs assistance Equipment used: 1 person hand held assist Transfers: Sit to/from Stand Sit to Stand: Min guard           General transfer comment: Increased time and effort to rise. Min guard for safety.    Ambulation/Gait Ambulation/Gait assistance: Min guard Gait Distance (Feet): 115 Feet Assistive device: 1 person hand held assist Gait Pattern/deviations: Step-through pattern, Decreased stride length Gait velocity: Decreased     General Gait Details: Slow, mildly unsteady gait. Antalgic gait pattern secondary to RLE pain. Pt reports she did not want to use RW, but had a cane at home she would use. Educated about walking program to perform at home.  Stairs Stairs: Yes Stairs assistance: Min guard Stair Management: One rail Left, Step to pattern, Forwards Number of Stairs: 3 General stair comments: Cues for LE sequencing and step to technique. Min Guard for safety. Educated about how family can assist at d/c.  Wheelchair Mobility    Modified Rankin (Stroke Patients Only)       Balance  Overall balance assessment: Needs assistance Sitting-balance support: Single extremity  supported, Bilateral upper extremity supported, No upper extremity supported, Feet supported Sitting balance-Leahy Scale: Good     Standing balance support: No upper extremity supported, During functional activity Standing balance-Leahy Scale: Fair Standing balance comment: min guard A for short distance                             Pertinent Vitals/Pain Pain Assessment Pain Assessment: Faces Faces Pain Scale: Hurts even more Pain Location: operative site, anterior thighs Pain Descriptors / Indicators: Aching, Sore, Operative site guarding Pain Intervention(s): Limited activity within patient's tolerance, Monitored during session, Repositioned    Home Living Family/patient expects to be discharged to:: Private residence Living Arrangements: Children;Other relatives (brother) Available Help at Discharge: Family;Available 24 hours/day (son recently quit job and will initially be available  24/7) Type of Home: House Home Access: Stairs to enter Entrance Stairs-Rails:  (Pt with difficulty reporting, but thinks she has rails bilaterally) Entrance Stairs-Number of Steps: 3   Home Layout: [place]: [place] - single point      Prior Function Prior Level of Function : Independent/Modified Independent;Driving             Mobility Comments: no AD ADLs Comments: Indep in ADL and IADL, driving     Hand Dominance        Extremity/Trunk Assessment   Upper Extremity Assessment Upper Extremity Assessment: Defer to OT evaluation    Lower Extremity Assessment Lower Extremity Assessment: Generalized weakness    Cervical / Trunk Assessment Cervical / Trunk Assessment: Back Surgery  Communication   Communication: No difficulties  Cognition Arousal/Alertness: Awake/alert Behavior During Therapy: WFL for tasks assessed/performed Overall Cognitive Status: Within Functional Limits for tasks assessed                                           General Comments General comments (skin integrity, edema, etc.): VSS. Son able to help at home    Exercises     Assessment/Plan    PT Assessment Patient needs continued PT services  PT Problem List Decreased strength;Decreased activity tolerance;Decreased balance;Decreased mobility;Decreased knowledge of precautions;Pain       PT Treatment Interventions DME instruction;Gait training;Stair training;Functional mobility training;Therapeutic activities;Therapeutic exercise;Balance training;Patient/family education    PT Goals (Current goals can be found in the Care Plan section)  Acute Rehab PT Goals Patient Stated Goal: to go home PT Goal Formulation: With patient Time For Goal Achievement: 01/11/23 Potential to Achieve Goals: Good    Frequency Min 5X/week     Co-evaluation               AM-PAC PT "6 Clicks" Mobility  Outcome Measure Help needed turning from your back to your side while in a flat bed without using bedrails?: None Help needed moving from lying on your back to sitting on the side of a flat bed without using bedrails?: A Little Help needed moving to and from a bed to a chair (including a wheelchair)?: A Little Help needed standing up from a chair using your arms (e.g., wheelchair or bedside chair)?: A Little Help needed to walk in hospital room?: A Little Help needed climbing 3-5 steps with a railing? : A Little 6 Click Score: 19    End of Session Equipment Utilized During Treatment: Gait  belt;Back brace Activity Tolerance: Patient tolerated treatment well Patient left: in bed;with call bell/phone within reach Nurse Communication: Mobility status PT Visit Diagnosis: Other abnormalities of gait and mobility (R26.89);Pain;Difficulty in walking, not elsewhere classified (R26.2) Pain - part of body:  (back)    Time: [id number] PT Time Calculation (min) (ACUTE ONLY): 12 min   Charges:   PT Evaluation $PT Eval Low Complexity: 1 Low           Lou Miner, DPT  Acute Rehabilitation Services  Office: [phone]   Rudean Hitt 12/28/2022, 10:13 AM

## 2023-01-01 MED FILL — Heparin Sodium (Porcine) Inj 1000 Unit/ML: INTRAMUSCULAR | Qty: 30 | Status: AC

## 2023-01-17 ENCOUNTER — Inpatient Hospital Stay: Payer: Medicare Other | Attending: Oncology

## 2023-01-17 DIAGNOSIS — Z85048 Personal history of other malignant neoplasm of rectum, rectosigmoid junction, and anus: Secondary | ICD-10-CM | POA: Insufficient documentation

## 2023-01-17 DIAGNOSIS — C20 Malignant neoplasm of rectum: Secondary | ICD-10-CM

## 2023-01-18 LAB — CEA: CEA: 2.2 ng/mL (ref 0.0–4.7)

## 2023-01-22 NOTE — Progress Notes (Unsigned)
[facility]  [address] [place],  [place]  [address] [phone]  Clinic Day:  01/23/2023  Referring physician: Galvin Proffer, MD  HISTORY OF PRESENT ILLNESS:  The patient is a 57 y.o. female with stage IIB (T3 N0 M0) rectal cancer, who comes in today for routine follow-up.  Her definitive therapy consisted of chemoradiation, followed by a low anterior resection, followed by adjuvant FOLFOX chemotherapy - all of which was completed in February 2022.  CT scans done after the completion of all of her definitive therapy showed complete disease resolution.  Since her last visit, the patient has been doing well.  With respect to her cancer s,he denies having any GI symptoms which concern her for disease recurrence.    PHYSICAL EXAM:  Blood pressure (!) 149/104, pulse 88, temperature 98.6 F (37 C), resp. rate 14, height 5' 1.2" (1.554 m), weight 148 lb 8 oz (67.4 kg), SpO2 94 %. Wt Readings from Last 3 Encounters:  01/23/23 148 lb 8 oz (67.4 kg)  12/27/22 145 lb (65.8 kg)  12/24/22 142 lb 9.6 oz (64.7 kg)   Body mass index is 27.88 kg/m. Performance status (ECOG): 1 Physical Exam Constitutional:      Appearance: Normal appearance. She is not ill-appearing.  HENT:     Mouth/Throat:     Mouth: Mucous membranes are moist.     Pharynx: Oropharynx is clear. No oropharyngeal exudate or posterior oropharyngeal erythema.  Cardiovascular:     Rate and Rhythm: Normal rate and regular rhythm.     Heart sounds: No murmur heard.    No friction rub. No gallop.  Pulmonary:     Effort: Pulmonary effort is normal. No respiratory distress.     Breath sounds: Normal breath sounds. No wheezing, rhonchi or rales.  Abdominal:     General: Bowel sounds are normal. There is no distension.     Palpations: Abdomen is soft. There is no mass.     Tenderness: There is no abdominal tenderness.  Musculoskeletal:        General: No swelling.     Right lower leg: No edema.      Left lower leg: No edema.  Lymphadenopathy:     Cervical: No cervical adenopathy.     Upper Body:     Right upper body: No supraclavicular or axillary adenopathy.     Left upper body: No supraclavicular or axillary adenopathy.     Lower Body: No right inguinal adenopathy. No left inguinal adenopathy.  Skin:    General: Skin is warm.     Coloration: Skin is not jaundiced.     Findings: No lesion or rash.  Neurological:     General: No focal deficit present.     Mental Status: She is alert and oriented to person, place, and time. Mental status is at baseline.  Psychiatric:        Mood and Affect: Mood normal.        Behavior: Behavior normal.        Thought Content: Thought content normal.    LABS:    Latest Reference Range & Units 01/17/23 13:20  CEA 0.0 - 4.7 ng/mL 2.2   ASSESSMENT & PLAN:  Assessment/Plan:  A 57 y.o. female with stage IIB rectal cancer, who is over 2 years out from the completion of all of her definitive therapy.  I am pleased that her physical exam and CEA level remain normal.  Clinically, the patient is doing very  well.  I do feel comfortable seeing her back in 6 months for repeat clinical assessment.  The patient understands all the plans discussed today and is in agreement with them.    Duchess Armendarez Kirby Funk, MD

## 2023-01-23 ENCOUNTER — Inpatient Hospital Stay: Payer: Medicare Other | Admitting: Oncology

## 2023-01-23 VITALS — BP 149/104 | HR 88 | Temp 98.6°F | Resp 14 | Ht 61.2 in | Wt 148.5 lb

## 2023-01-23 DIAGNOSIS — C20 Malignant neoplasm of rectum: Secondary | ICD-10-CM

## 2023-02-10 ENCOUNTER — Other Ambulatory Visit (HOSPITAL_COMMUNITY): Payer: Self-pay

## 2023-02-10 MED ORDER — MOUNJARO 15 MG/0.5ML ~~LOC~~ SOAJ
15.0000 mg | SUBCUTANEOUS | 2 refills | Status: AC
  Filled 2023-02-10: qty 2, 28d supply, fill #0
  Filled 2023-03-19: qty 2, 28d supply, fill #1

## 2023-03-19 ENCOUNTER — Other Ambulatory Visit (HOSPITAL_COMMUNITY): Payer: Self-pay

## 2023-03-27 ENCOUNTER — Ambulatory Visit: Payer: Medicare Other | Admitting: Podiatry

## 2023-04-15 ENCOUNTER — Other Ambulatory Visit (HOSPITAL_COMMUNITY): Payer: Self-pay

## 2023-04-16 ENCOUNTER — Ambulatory Visit: Payer: Medicare Other | Admitting: Podiatry

## 2023-04-16 DIAGNOSIS — E1142 Type 2 diabetes mellitus with diabetic polyneuropathy: Secondary | ICD-10-CM | POA: Diagnosis not present

## 2023-04-16 DIAGNOSIS — E1151 Type 2 diabetes mellitus with diabetic peripheral angiopathy without gangrene: Secondary | ICD-10-CM | POA: Diagnosis not present

## 2023-04-16 DIAGNOSIS — B351 Tinea unguium: Secondary | ICD-10-CM | POA: Diagnosis not present

## 2023-04-16 DIAGNOSIS — Z89421 Acquired absence of other right toe(s): Secondary | ICD-10-CM

## 2023-04-16 MED ORDER — GABAPENTIN 300 MG PO CAPS: 300.0000 mg | ORAL_CAPSULE | Freq: Two times a day (BID) | ORAL | 0 refills | Status: DC

## 2023-04-16 NOTE — Progress Notes (Signed)
       Subjective:  Patient ID: Gloria Hodge, female    DOB: [birth year],  MRN: [id number]   Gloria Hodge presents to clinic today for:  Chief Complaint  Patient presents with   Nail Problem    Routine Foot Care- Nail trim   . Patient notes nails are thick, discolored, elongated and painful in shoegear when trying to ambulate.  Patient notes she'd like to try gabapentin again for her neuropathy.  PCP is [name], Myrene Galas, MD.  Allergies  Allergen Reactions   Morphine Nausea Only   Tramadol Hcl Nausea And Vomiting   Bactrim [Sulfamethoxazole-Trimethoprim] Rash   Review of Systems: Negative except as noted in the HPI.  Objective:  There were no vitals filed for this visit.  Gloria Hodge is a pleasant 57 y.o. female in NAD. AAO x 3.  Vascular Examination: Capillary refill time is 3-5 seconds to toes bilateral. Palpable pedal pulses b/l LE. Digital hair present b/l. No pedal edema b/l. Skin temperature gradient WNL b/l. No varicosities b/l. No cyanosis or clubbing noted b/l.   Dermatological Examination: Pedal skin with normal turgor, texture and tone b/l. No open wounds. No interdigital macerations b/l. Toenails x3 are 3mm thick, discolored, dystrophic with subungual debris. There is no pain with compression of the nail plates due to neuropathy.  They are elongated x3  Neurological Examination: Protective sensation absent bilateral LE.   Musculoskeletal Examination: Muscle strength 5/5 to all LE muscle groups b/l. Previous bilateral toe amputations.  Contractures of remaining lesser toes at PIPJ's     Latest Ref Rng & Units 12/24/2022    1:33 PM  Hemoglobin A1C  Hemoglobin-A1c 4.8 - 5.6 % 5.3    Assessment/Plan: 1. 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 Onychomycosis   3. Type II diabetes mellitus with peripheral circulatory disorder (HCC)     Meds ordered this encounter  Medications   gabapentin  (NEURONTIN) 300 MG capsule    Sig: Take 1 capsule (300 mg total) by mouth 2 (two) times daily.    Dispense:  60 capsule    Refill:  0   The mycotic toenails were sharply debrided x3 with sterile nail nippers and a power debriding burr to decrease bulk/thickness and length.    She was started on gabapentin 300mg  BID.  Call with update on how this is managing her neuropathy symptoms in about a month to see if dosing/frequency needs adjusted.  Return in about 3 months (around 07/17/2023) for RFC.   Clerance Lav, DPM, FACFAS [facility]     [address] [place], [place] [address]                Office [phone]  Fax [phone]

## 2023-04-28 ENCOUNTER — Other Ambulatory Visit (HOSPITAL_COMMUNITY): Payer: Self-pay

## 2023-05-08 ENCOUNTER — Other Ambulatory Visit (HOSPITAL_COMMUNITY): Payer: Self-pay

## 2023-05-15 ENCOUNTER — Other Ambulatory Visit: Payer: Self-pay | Admitting: Podiatry

## 2023-05-16 ENCOUNTER — Telehealth: Payer: Self-pay | Admitting: Podiatry

## 2023-05-16 DIAGNOSIS — E1142 Type 2 diabetes mellitus with diabetic polyneuropathy: Secondary | ICD-10-CM

## 2023-05-16 MED ORDER — GABAPENTIN 600 MG PO TABS: 600.0000 mg | ORAL_TABLET | Freq: Three times a day (TID) | ORAL | 1 refills | Status: DC

## 2023-05-16 NOTE — Telephone Encounter (Signed)
Patient called to give update on how she's feeling with the gabapentin 300mg  BID.  States maybe 10 - 20% better.  Will change to gabapentin 600mg  TID.  Advised her on side effects.  Call again in 30 days to give progress report.

## 2023-06-18 ENCOUNTER — Other Ambulatory Visit: Payer: Self-pay | Admitting: Podiatry

## 2023-06-18 DIAGNOSIS — E1142 Type 2 diabetes mellitus with diabetic polyneuropathy: Secondary | ICD-10-CM

## 2023-07-16 ENCOUNTER — Ambulatory Visit: Payer: Medicare Other | Admitting: Podiatry

## 2023-07-17 ENCOUNTER — Inpatient Hospital Stay: Payer: Medicare Other | Attending: Oncology

## 2023-07-17 DIAGNOSIS — Z85048 Personal history of other malignant neoplasm of rectum, rectosigmoid junction, and anus: Secondary | ICD-10-CM | POA: Insufficient documentation

## 2023-07-17 DIAGNOSIS — C20 Malignant neoplasm of rectum: Secondary | ICD-10-CM

## 2023-07-18 LAB — CEA: CEA: 2.3 ng/mL (ref 0.0–4.7)

## 2023-07-23 ENCOUNTER — Encounter: Payer: Medicare Other | Admitting: Podiatry

## 2023-07-23 NOTE — Progress Notes (Signed)
Patient was a no show for today's scheduled appt.

## 2023-07-24 NOTE — Progress Notes (Signed)
[facility]  [address] [place],  [place]  [address] [phone]  Clinic Day:  07/25/2023  Referring physician: Galvin Proffer, MD  HISTORY OF PRESENT ILLNESS:  The patient is a 57 y.o. female with stage IIB (T3 N0 M0) rectal cancer, who comes in today for routine follow-up.  Her definitive therapy consisted of chemoradiation, followed by a low anterior resection, followed by adjuvant FOLFOX chemotherapy - all of which was completed in February 2022.  CT scans done after the completion of all of her definitive therapy showed complete disease resolution.  She comes into clinic today complaining of abdominal pain.  Of note, she underwent an appendectomy 4 days ago.  She did have a bowel movement this morning, but her abdominal pain persists.     PHYSICAL EXAM:  Blood pressure (!) 194/88, pulse 86, temperature 99.4 F (37.4 C), resp. rate 16, height 5' 1.2" (1.554 m), weight 180 lb 12.8 oz (82 kg), SpO2 96%. Wt Readings from Last 3 Encounters:  07/25/23 180 lb 12.8 oz (82 kg)  01/23/23 148 lb 8 oz (67.4 kg)  12/27/22 145 lb (65.8 kg)   Body mass index is 33.94 kg/m. Performance status (ECOG): 1 Physical Exam Constitutional:      Appearance: Normal appearance. She is not ill-appearing.  HENT:     Mouth/Throat:     Mouth: Mucous membranes are moist.     Pharynx: Oropharynx is clear. No oropharyngeal exudate or posterior oropharyngeal erythema.  Cardiovascular:     Rate and Rhythm: Normal rate and regular rhythm.     Heart sounds: No murmur heard.    No friction rub. No gallop.  Pulmonary:     Effort: Pulmonary effort is normal. No respiratory distress.     Breath sounds: Normal breath sounds. No wheezing, rhonchi or rales.  Abdominal:     General: Bowel sounds are normal. There is no distension.     Palpations: Abdomen is soft. There is no mass.     Tenderness: There is no abdominal tenderness.  Musculoskeletal:        General: No swelling.      Right lower leg: No edema.     Left lower leg: No edema.  Lymphadenopathy:     Cervical: No cervical adenopathy.     Upper Body:     Right upper body: No supraclavicular or axillary adenopathy.     Left upper body: No supraclavicular or axillary adenopathy.     Lower Body: No right inguinal adenopathy. No left inguinal adenopathy.  Skin:    General: Skin is warm.     Coloration: Skin is not jaundiced.     Findings: No lesion or rash.  Neurological:     General: No focal deficit present.     Mental Status: She is alert and oriented to person, place, and time. Mental status is at baseline.  Psychiatric:        Mood and Affect: Mood normal.        Behavior: Behavior normal.        Thought Content: Thought content normal.   LABS:    Latest Reference Range & Units 07/17/23 14:03  CEA 0.0 - 4.7 ng/mL 2.3   SCANS:  Her KUB done today revealed the following: FINDINGS: Supine and erect views of the abdomen demonstrate scattered air-fluid levels within the small and large bowel. No significant bowel distension is identified, although the abdomen does appear mildly distended. There is no evidence of pneumoperitoneum.  No suspicious abdominal calcifications. Stable postsurgical changes from previous L4-S1 fusion.  IMPRESSION: Scattered air-fluid levels within the small and large bowel, most consistent with a postoperative ileus. Radiographic follow-up recommended if there are obstructive symptoms.  ASSESSMENT & PLAN:  Assessment/Plan:  A 57 y.o. female with stage IIB rectal cancer, who is over 2-1/2 years out from the completion of all of her definitive therapy.  From a rectal cancer, she remains disease free.  Of note, I did have her undergo a KUB for her abdominal pain.  This study revealed air-fluid levels likely consistent with a postobstructive ileus.  She knows to speak with general surgery about her abdominal pain if it worsens over time.  Otherwise, I will see her back in 6  months for repeat clinical assessment.  The patient understands all the plans discussed today and is in agreement with them.    Ragnar Waas Kirby Funk, MD

## 2023-07-25 ENCOUNTER — Other Ambulatory Visit: Payer: Self-pay | Admitting: Oncology

## 2023-07-25 ENCOUNTER — Inpatient Hospital Stay: Payer: Medicare Other | Admitting: Oncology

## 2023-07-25 VITALS — BP 194/88 | HR 86 | Temp 99.4°F | Resp 16 | Ht 61.2 in | Wt 180.8 lb

## 2023-07-25 DIAGNOSIS — C20 Malignant neoplasm of rectum: Secondary | ICD-10-CM

## 2023-07-28 ENCOUNTER — Telehealth: Payer: Self-pay | Admitting: Oncology

## 2023-07-28 NOTE — Telephone Encounter (Signed)
Contacted pt to schedule an appt. Unable to reach via phone, voicemail was left.   Scheduling Message Entered by Rennis Harding A on 07/25/2023 at  6:21 PM Priority: Routine <No visit type provided>  Department: [facility]  Provider:  Scheduling Notes:  Labs 01-22-24  Appt 01-23-24

## 2023-08-21 ENCOUNTER — Encounter: Payer: Self-pay | Admitting: Oncology

## 2023-08-22 ENCOUNTER — Ambulatory Visit (INDEPENDENT_AMBULATORY_CARE_PROVIDER_SITE_OTHER): Payer: Medicare Other | Admitting: Podiatry

## 2023-08-22 DIAGNOSIS — B351 Tinea unguium: Secondary | ICD-10-CM | POA: Diagnosis not present

## 2023-08-22 DIAGNOSIS — E1151 Type 2 diabetes mellitus with diabetic peripheral angiopathy without gangrene: Secondary | ICD-10-CM

## 2023-08-22 DIAGNOSIS — Z89422 Acquired absence of other left toe(s): Secondary | ICD-10-CM

## 2023-08-22 DIAGNOSIS — Z89421 Acquired absence of other right toe(s): Secondary | ICD-10-CM | POA: Diagnosis not present

## 2023-08-22 NOTE — Progress Notes (Unsigned)
Subjective:  Patient ID: Gloria Hodge, female    DOB: Feb 04, 1966,  MRN: [id number]   Gloria Hodge presents to clinic today for:  Chief Complaint  Patient presents with   Foot Care    Last A1c: 9.4. no anticoag.    Nail Problem    Right foot middle 2nd toe seems to have something draining from under the nail. Toe nail feels like it is about to come off.   . Patient notes nail is thick, discolored, elongated and painful in shoegear when trying to ambulate.  She has one toenail on right 3rd toe, but rest of toes are either partially, or fully amputated.  PCP is [name], Myrene Galas, MD.  Last seen within the past 3 months.  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   Muscle pain    Swelling     Past Surgical History:  Procedure Laterality Date   ABDOMINAL HYSTERECTOMY     BLADDER SURGERY     BREAST SURGERY     reduction   carpel tunnel surgery     right hand   EYE SURGERY Left 11/29/2019   for diabetic retinopathy   FLEXIBLE SIGMOIDOSCOPY  06/21/2020   Procedure: FLEXIBLE SIGMOIDOSCOPY;  Surgeon: Romie Levee, MD;  Location: [facility];  Service: General;;   HERNIA REPAIR     KNEE SURGERY     left   PORT-A-CATH REMOVAL  2023   PORTACATH PLACEMENT  12/2019   TOE AMPUTATION Bilateral 2018-2020   great and 3rd toes   XI ROBOTIC ASSISTED LOWER ANTERIOR RESECTION N/A 06/21/2020   Procedure: XI ROBOTIC ASSISTED LOWER ANTERIOR RESECTION;  Surgeon: Romie Levee, MD;  Location: [facility];  Service: General;  Laterality: N/A;     Allergies  Allergen Reactions   Morphine Nausea Only   Tramadol Hcl Nausea And Vomiting   Bactrim [Sulfamethoxazole-Trimethoprim] Rash   Review of Systems: Negative except as noted in the HPI.  Objective:  Gloria Hodge is a pleasant 57 y.o. female in NAD. AAO x 3.  Vascular Examination: Capillary refill time is 3-5 seconds to toes bilateral. Palpable pedal pulses b/l LE. Digital hair present b/l.  Skin temperature gradient WNL b/l. No varicosities b/l. No cyanosis noted b/l.   Dermatological Examination: Pedal skin with normal turgor, texture and tone b/l. No open wounds. No interdigital macerations b/l. Right 3rd toenail is 3mm thick, discolored, dystrophic with subungual debris. It is elongated.      Latest Ref Rng & Units 12/24/2022    1:33 PM  Hemoglobin A1C  Hemoglobin-A1c 4.8 - 5.6 % 5.3     Assessment/Plan: 1. Onychomycosis   2. Type II diabetes mellitus with peripheral circulatory disorder (HCC)   3. 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    Return in about 3 months (around 11/22/2023) for RFC.   Clerance Lav, DPM, FACFAS [facility]     [address] [place], [place] [address]                Office [phone]  Fax [phone]

## 2023-11-28 ENCOUNTER — Ambulatory Visit: Payer: Medicare Other | Admitting: Podiatry

## 2023-11-28 ENCOUNTER — Other Ambulatory Visit (HOSPITAL_COMMUNITY): Payer: Self-pay

## 2023-11-28 ENCOUNTER — Other Ambulatory Visit: Payer: Self-pay | Admitting: Podiatry

## 2023-11-28 MED ORDER — OXYCODONE HCL 20 MG PO TABS
20.0000 mg | ORAL_TABLET | Freq: Three times a day (TID) | ORAL | 0 refills | Status: DC | PRN
  Filled 2023-11-28: qty 90, 30d supply, fill #0

## 2023-12-01 ENCOUNTER — Other Ambulatory Visit: Payer: Self-pay | Admitting: Podiatry

## 2023-12-01 DIAGNOSIS — E1142 Type 2 diabetes mellitus with diabetic polyneuropathy: Secondary | ICD-10-CM

## 2023-12-01 MED ORDER — GABAPENTIN 600 MG PO TABS: 600.0000 mg | ORAL_TABLET | Freq: Three times a day (TID) | ORAL | 2 refills | Status: AC

## 2023-12-18 ENCOUNTER — Ambulatory Visit: Payer: Medicare Other | Admitting: Podiatry

## 2023-12-18 DIAGNOSIS — B351 Tinea unguium: Secondary | ICD-10-CM | POA: Diagnosis not present

## 2023-12-18 DIAGNOSIS — Z89421 Acquired absence of other right toe(s): Secondary | ICD-10-CM | POA: Diagnosis not present

## 2023-12-18 DIAGNOSIS — E1142 Type 2 diabetes mellitus with diabetic polyneuropathy: Secondary | ICD-10-CM

## 2023-12-18 DIAGNOSIS — Z89422 Acquired absence of other left toe(s): Secondary | ICD-10-CM | POA: Diagnosis not present

## 2023-12-18 NOTE — Progress Notes (Signed)
       Subjective:  Patient ID: Gloria Hodge, female    DOB: 12-Dec-1965,  MRN: [id number]  Gloria Hodge presents to clinic today for:  Chief Complaint  Patient presents with   [facility]    [facility] today.  Last A1c 5.5, 3 months ago. No anti coag.    Patient notes nails are thick and elongated, causing pain in shoe gear when ambulating.  Patient has a history of previous toe amputation secondary to diabetic foot infections.  She is interested in the diabetic shoe program today.  PCP is Gloria Proffer, MD. last seen around 11/28/2023  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   Muscle pain    Swelling     Allergies  Allergen Reactions   Morphine Nausea Only   Tramadol Hcl Nausea And Vomiting   Bactrim [Sulfamethoxazole-Trimethoprim] Rash    Objective:  Gloria Hodge is a pleasant 58 y.o. female in NAD. AAO x 3.  Vascular Examination: Patient has palpable DP pulse, absent PT pulse bilateral.  Delayed capillary refill bilateral toes.  Sparse digital hair bilateral.  Proximal to distal cooling WNL bilateral.  Prior toe amputations noted  Dermatological Examination: Interspaces are clear with no open lesions noted bilateral.  Skin is shiny and atrophic bilateral.  Nails are 3-75mm thick, with yellowish/brown discoloration, subungual debris and distal onycholysis x 3.  There is pain with compression of nails x 3     Latest Ref Rng & Units 12/24/2022    1:33 PM  Hemoglobin A1C  Hemoglobin-A1c 4.8 - 5.6 % 5.3     Patient  qualifies for at-risk foot care because of history of toe amputation bilateral.  Assessment/Plan: 1. Diabetic peripheral neuropathy associated with type 2 diabetes mellitus (HCC)   2. History of amputation of lesser toe of both feet (HCC)   3. Onychomycosis    Mycotic nails x 3 were sharply debrided with sterile nail nippers and power debriding burr to decrease bulk and length.  Patient would benefit from diabetic shoes with custom diabetic insoles.  Will place order today and have her schedule with our pedorthist for a shoe consultation.   Return in about 3 months (around 03/19/2024) for [facility].   Clerance Lav, DPM, FACFAS [facility]     [address] [place], [place] [address]                Office [phone]  Fax [phone]

## 2023-12-25 ENCOUNTER — Other Ambulatory Visit (HOSPITAL_COMMUNITY): Payer: Self-pay

## 2023-12-25 MED ORDER — OXYCODONE HCL 20 MG PO TABS
20.0000 mg | ORAL_TABLET | Freq: Three times a day (TID) | ORAL | 0 refills | Status: AC | PRN
  Filled 2023-12-26: qty 90, 30d supply, fill #0

## 2023-12-26 ENCOUNTER — Other Ambulatory Visit (HOSPITAL_COMMUNITY): Payer: Self-pay

## 2024-01-22 ENCOUNTER — Inpatient Hospital Stay: Payer: Medicare Other | Attending: Oncology

## 2024-01-22 DIAGNOSIS — C20 Malignant neoplasm of rectum: Secondary | ICD-10-CM

## 2024-01-22 DIAGNOSIS — Z85048 Personal history of other malignant neoplasm of rectum, rectosigmoid junction, and anus: Secondary | ICD-10-CM | POA: Diagnosis present

## 2024-01-22 LAB — CEA (ACCESS): CEA (CHCC): 1.97 ng/mL (ref 0.00–5.00)

## 2024-01-22 NOTE — Progress Notes (Unsigned)
 [facility] [place]  [address] [place],  [place]  [address] [phone]  Clinic Day:  01/23/2024  Referring physician: Shelbie Ammons, MD  HISTORY OF PRESENT ILLNESS:  The patient is a 58 y.o. female with stage IIB (T3 N0 M0) rectal cancer, who comes in today for routine follow-up.  Her definitive therapy consisted of chemoradiation, followed by a low anterior resection, followed by adjuvant FOLFOX chemotherapy - all of which was completed in February 2022.  CT scans done after the completion of all of her definitive therapy showed complete disease resolution.  She comes into clinic today for routine follow-up.  Since her last visit, the patient has been doing well.  She denies having any abdominal pain, rectal bleeding, or other GI symptoms which concern her for disease recurrence.      PHYSICAL EXAM:  Blood pressure (!) 152/76, pulse 87, temperature 99 F (37.2 C), temperature source Oral, resp. rate 16, height 5' 1.2" (1.554 m), weight 157 lb 4.8 oz (71.4 kg), SpO2 99%. Wt Readings from Last 3 Encounters:  01/23/24 157 lb 4.8 oz (71.4 kg)  07/25/23 180 lb 12.8 oz (82 kg)  01/23/23 148 lb 8 oz (67.4 kg)   Body mass index is 29.53 kg/m. Performance status (ECOG): 1 Physical Exam Constitutional:      Appearance: Normal appearance. She is not ill-appearing.  HENT:     Mouth/Throat:     Mouth: Mucous membranes are moist.     Pharynx: Oropharynx is clear. No oropharyngeal exudate or posterior oropharyngeal erythema.  Cardiovascular:     Rate and Rhythm: Normal rate and regular rhythm.     Heart sounds: No murmur heard.    No friction rub. No gallop.  Pulmonary:     Effort: Pulmonary effort is normal. No respiratory distress.     Breath sounds: Normal breath sounds. No wheezing, rhonchi or rales.  Abdominal:     General: Bowel sounds are normal. There is no distension.     Palpations: Abdomen is soft. There is no mass.     Tenderness: There is no  abdominal tenderness.  Musculoskeletal:        General: No swelling.     Right lower leg: No edema.     Left lower leg: No edema.  Lymphadenopathy:     Cervical: No cervical adenopathy.     Upper Body:     Right upper body: No supraclavicular or axillary adenopathy.     Left upper body: No supraclavicular or axillary adenopathy.     Lower Body: No right inguinal adenopathy. No left inguinal adenopathy.  Skin:    General: Skin is warm.     Coloration: Skin is not jaundiced.     Findings: No lesion or rash.  Neurological:     General: No focal deficit present.     Mental Status: She is alert and oriented to person, place, and time. Mental status is at baseline.  Psychiatric:        Mood and Affect: Mood normal.        Behavior: Behavior normal.        Thought Content: Thought content normal.    LABS:    Latest Reference Range & Units 01/17/23 13:20 07/17/23 14:03 01/22/24 12:52  CEA 0.0 - 4.7 ng/mL 2.2 2.3   CEA ([facility]) 0.00 - 5.00 ng/mL   1.97   ASSESSMENT & PLAN:  Assessment/Plan:  A 58 y.o. female with stage IIB rectal cancer, who is over  3 years out from the completion of all of her definitive therapy.  From a rectal cancer, she remains disease free.  Clinically, the patient is doing very well.  As that is the case, I will see her back in 6 months for repeat clinical assessment.  The patient understands all the plans discussed today and is in agreement with them.    Syesha Thaw Kirby Funk, MD

## 2024-01-23 ENCOUNTER — Inpatient Hospital Stay: Payer: Medicare Other | Admitting: Oncology

## 2024-01-23 ENCOUNTER — Other Ambulatory Visit: Payer: Self-pay | Admitting: Oncology

## 2024-01-23 ENCOUNTER — Telehealth: Payer: Self-pay | Admitting: Oncology

## 2024-01-23 VITALS — BP 152/76 | HR 87 | Temp 99.0°F | Resp 16 | Ht 61.2 in | Wt 157.3 lb

## 2024-01-23 DIAGNOSIS — C20 Malignant neoplasm of rectum: Secondary | ICD-10-CM

## 2024-01-23 DIAGNOSIS — Z85048 Personal history of other malignant neoplasm of rectum, rectosigmoid junction, and anus: Secondary | ICD-10-CM | POA: Diagnosis not present

## 2024-01-23 NOTE — Telephone Encounter (Signed)
 Patient has been scheduled for follow-up visit per 01/22/24 LOS.  Pt noted appt details on personal planner/calendar.

## 2024-01-26 ENCOUNTER — Other Ambulatory Visit (HOSPITAL_BASED_OUTPATIENT_CLINIC_OR_DEPARTMENT_OTHER): Payer: Self-pay

## 2024-01-26 ENCOUNTER — Other Ambulatory Visit (HOSPITAL_COMMUNITY): Payer: Self-pay

## 2024-01-26 MED ORDER — FLUTICASONE PROPIONATE 50 MCG/ACT NA SUSP
2.0000 | Freq: Every day | NASAL | 2 refills | Status: AC
  Filled 2024-01-26: qty 16, 30d supply, fill #0

## 2024-01-26 MED ORDER — OXYCODONE HCL 20 MG PO TABS
20.0000 mg | ORAL_TABLET | Freq: Three times a day (TID) | ORAL | 0 refills | Status: AC
  Filled 2024-01-26 (×2): qty 90, 30d supply, fill #0

## 2024-01-26 MED ORDER — SERTRALINE HCL 50 MG PO TABS
50.0000 mg | ORAL_TABLET | Freq: Every day | ORAL | 1 refills | Status: AC
  Filled 2024-01-26: qty 30, 30d supply, fill #0

## 2024-01-29 ENCOUNTER — Other Ambulatory Visit (HOSPITAL_BASED_OUTPATIENT_CLINIC_OR_DEPARTMENT_OTHER): Payer: Self-pay

## 2024-01-29 MED ORDER — ZOLPIDEM TARTRATE 10 MG PO TABS
10.0000 mg | ORAL_TABLET | Freq: Every evening | ORAL | 0 refills | Status: DC | PRN
  Filled 2024-01-29: qty 30, 30d supply, fill #0

## 2024-02-23 ENCOUNTER — Other Ambulatory Visit (HOSPITAL_COMMUNITY): Payer: Self-pay

## 2024-02-23 ENCOUNTER — Other Ambulatory Visit (HOSPITAL_BASED_OUTPATIENT_CLINIC_OR_DEPARTMENT_OTHER): Payer: Self-pay

## 2024-02-23 MED ORDER — OXYCODONE HCL 20 MG PO TABS
20.0000 mg | ORAL_TABLET | Freq: Three times a day (TID) | ORAL | 0 refills | Status: DC | PRN
  Filled 2024-02-23 (×2): qty 90, 30d supply, fill #0

## 2024-02-24 ENCOUNTER — Other Ambulatory Visit (HOSPITAL_COMMUNITY): Payer: Self-pay

## 2024-02-26 ENCOUNTER — Other Ambulatory Visit (HOSPITAL_BASED_OUTPATIENT_CLINIC_OR_DEPARTMENT_OTHER): Payer: Self-pay

## 2024-02-26 MED ORDER — BLOOD GLUCOSE TEST VI STRP
ORAL_STRIP | Freq: Three times a day (TID) | 2 refills | Status: AC
  Filled 2024-02-26: qty 100, 34d supply, fill #0

## 2024-02-26 MED ORDER — ZOLPIDEM TARTRATE 10 MG PO TABS
10.0000 mg | ORAL_TABLET | Freq: Every evening | ORAL | 0 refills | Status: DC | PRN
  Filled 2024-02-26: qty 30, 30d supply, fill #0

## 2024-03-01 ENCOUNTER — Other Ambulatory Visit (HOSPITAL_BASED_OUTPATIENT_CLINIC_OR_DEPARTMENT_OTHER): Payer: Self-pay

## 2024-03-16 ENCOUNTER — Other Ambulatory Visit

## 2024-03-24 ENCOUNTER — Other Ambulatory Visit (HOSPITAL_COMMUNITY): Payer: Self-pay

## 2024-03-24 ENCOUNTER — Other Ambulatory Visit (HOSPITAL_BASED_OUTPATIENT_CLINIC_OR_DEPARTMENT_OTHER): Payer: Self-pay

## 2024-03-24 ENCOUNTER — Other Ambulatory Visit: Payer: Self-pay

## 2024-03-24 MED ORDER — OXYCODONE HCL 20 MG PO TABS
20.0000 mg | ORAL_TABLET | Freq: Three times a day (TID) | ORAL | 0 refills | Status: DC | PRN
  Filled 2024-03-24 (×2): qty 90, 30d supply, fill #0

## 2024-03-25 ENCOUNTER — Ambulatory Visit: Admitting: Podiatry

## 2024-03-25 ENCOUNTER — Other Ambulatory Visit (HOSPITAL_BASED_OUTPATIENT_CLINIC_OR_DEPARTMENT_OTHER): Payer: Self-pay

## 2024-03-25 DIAGNOSIS — Z89421 Acquired absence of other right toe(s): Secondary | ICD-10-CM

## 2024-03-25 DIAGNOSIS — Z89411 Acquired absence of right great toe: Secondary | ICD-10-CM | POA: Diagnosis not present

## 2024-03-25 DIAGNOSIS — L84 Corns and callosities: Secondary | ICD-10-CM | POA: Diagnosis not present

## 2024-03-25 DIAGNOSIS — Z89412 Acquired absence of left great toe: Secondary | ICD-10-CM

## 2024-03-25 DIAGNOSIS — B351 Tinea unguium: Secondary | ICD-10-CM | POA: Diagnosis not present

## 2024-03-25 DIAGNOSIS — Z89422 Acquired absence of other left toe(s): Secondary | ICD-10-CM

## 2024-03-25 NOTE — Progress Notes (Signed)
    Subjective:  Patient ID: Gloria Hodge, female    DOB: Nov 03, 1965,  MRN: [id number]  [name] presents to clinic today for:  Chief Complaint  Patient presents with   [facility]    [facility], with callous. She has peeled her nails so there is not much nail but 2 callous. Last A1c 6.3 in April. Blood work done yesterday. No anti coag.    Patient notes nails are thick and elongated, causing pain in shoe gear when ambulating.  Patient has a history of previous toe amputation secondary to diabetic foot infections.    PCP is Georgean Kindle, MD. 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 female in NAD. AAO x 3.  Vascular Examination: Patient has palpable DP pulse, absent PT pulse bilateral.  Delayed capillary refill bilateral toes.  Sparse digital hair bilateral.  Proximal to distal cooling WNL bilateral.  Prior toe amputations noted  Dermatological Examination: Interspaces are clear with no open lesions noted bilateral.  Skin is shiny and atrophic bilateral.  Nails are 3-51mm thick, with yellowish/brown discoloration, subungual debris and distal onycholysis x 3.  There is pain with compression of nails x 3.  There are hyperkeratotic lesions bilateral submet 5  Patient qualifies  for at-risk foot care because of history of toe amputation bilateral.  Assessment/Plan: 1. 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 Onychomycosis   3. Callus   4. History of amputation of great toe of both feet (HCC)    Mycotic nails x 3 were sharply debrided with sterile nail nippers and power debriding burr to decrease bulk and length.  Hyperkeratotic lesions bilateral submet 5 were shaved with sterile #313 blade uneventfully.  Return in about 3 months (around 06/25/2024) for [facility].   Joe Murders, DPM, FACFAS [facility]     [address] [place], [place] [address]                Office [phone]  Fax [phone]

## 2024-04-01 ENCOUNTER — Other Ambulatory Visit (HOSPITAL_BASED_OUTPATIENT_CLINIC_OR_DEPARTMENT_OTHER): Payer: Self-pay

## 2024-04-02 ENCOUNTER — Other Ambulatory Visit (HOSPITAL_BASED_OUTPATIENT_CLINIC_OR_DEPARTMENT_OTHER): Payer: Self-pay

## 2024-04-02 MED ORDER — ZOLPIDEM TARTRATE 10 MG PO TABS
10.0000 mg | ORAL_TABLET | Freq: Every evening | ORAL | 0 refills | Status: DC | PRN
  Filled 2024-05-04: qty 30, 30d supply, fill #0

## 2024-04-02 MED ORDER — ZOLPIDEM TARTRATE 10 MG PO TABS
10.0000 mg | ORAL_TABLET | Freq: Every evening | ORAL | 0 refills | Status: AC | PRN
  Filled 2024-04-02: qty 30, 30d supply, fill #0

## 2024-04-07 ENCOUNTER — Other Ambulatory Visit (HOSPITAL_BASED_OUTPATIENT_CLINIC_OR_DEPARTMENT_OTHER): Payer: Self-pay

## 2024-04-07 MED ORDER — CEFADROXIL 500 MG PO CAPS
500.0000 mg | ORAL_CAPSULE | Freq: Two times a day (BID) | ORAL | 0 refills | Status: AC
  Filled 2024-04-07: qty 2, 1d supply, fill #0

## 2024-04-12 ENCOUNTER — Other Ambulatory Visit (HOSPITAL_BASED_OUTPATIENT_CLINIC_OR_DEPARTMENT_OTHER): Payer: Self-pay

## 2024-04-12 MED ORDER — FLUTICASONE PROPIONATE 50 MCG/ACT NA SUSP
2.0000 | Freq: Every day | NASAL | 2 refills | Status: AC
  Filled 2024-04-12: qty 16, 30d supply, fill #0

## 2024-04-13 ENCOUNTER — Other Ambulatory Visit (HOSPITAL_BASED_OUTPATIENT_CLINIC_OR_DEPARTMENT_OTHER): Payer: Self-pay

## 2024-04-13 MED ORDER — VITAMIN D (ERGOCALCIFEROL) 1.25 MG (50000 UNIT) PO CAPS
50000.0000 [IU] | ORAL_CAPSULE | ORAL | 1 refills | Status: AC
  Filled 2024-04-13 – 2024-04-23 (×2): qty 12, 84d supply, fill #0
  Filled 2024-07-22: qty 12, 84d supply, fill #1

## 2024-04-23 ENCOUNTER — Other Ambulatory Visit (HOSPITAL_BASED_OUTPATIENT_CLINIC_OR_DEPARTMENT_OTHER): Payer: Self-pay

## 2024-04-23 MED ORDER — OXYCODONE HCL 20 MG PO TABS
20.0000 mg | ORAL_TABLET | Freq: Three times a day (TID) | ORAL | 0 refills | Status: AC | PRN
  Filled 2024-04-23: qty 90, 30d supply, fill #0

## 2024-04-28 ENCOUNTER — Other Ambulatory Visit (HOSPITAL_BASED_OUTPATIENT_CLINIC_OR_DEPARTMENT_OTHER): Payer: Self-pay

## 2024-04-28 MED ORDER — FLUTICASONE PROPIONATE 50 MCG/ACT NA SUSP
2.0000 | Freq: Every day | NASAL | 2 refills | Status: AC
  Filled 2024-04-28: qty 16, 30d supply, fill #0

## 2024-05-04 ENCOUNTER — Other Ambulatory Visit (HOSPITAL_BASED_OUTPATIENT_CLINIC_OR_DEPARTMENT_OTHER): Payer: Self-pay

## 2024-05-21 ENCOUNTER — Other Ambulatory Visit (HOSPITAL_BASED_OUTPATIENT_CLINIC_OR_DEPARTMENT_OTHER): Payer: Self-pay

## 2024-05-21 MED ORDER — OXYCODONE HCL 20 MG PO TABS
20.0000 mg | ORAL_TABLET | Freq: Three times a day (TID) | ORAL | 0 refills | Status: DC
  Filled 2024-05-21: qty 90, 30d supply, fill #0

## 2024-05-24 ENCOUNTER — Other Ambulatory Visit (HOSPITAL_BASED_OUTPATIENT_CLINIC_OR_DEPARTMENT_OTHER): Payer: Self-pay

## 2024-05-24 MED ORDER — METOCLOPRAMIDE HCL 10 MG PO TABS
10.0000 mg | ORAL_TABLET | Freq: Two times a day (BID) | ORAL | 0 refills | Status: DC
  Filled 2024-05-24: qty 60, 30d supply, fill #0

## 2024-05-24 MED ORDER — MOUNJARO 2.5 MG/0.5ML ~~LOC~~ SOAJ
2.5000 mg | SUBCUTANEOUS | 1 refills | Status: AC
  Filled 2024-05-24: qty 2, 28d supply, fill #0

## 2024-05-24 MED ORDER — PANTOPRAZOLE SODIUM 40 MG PO TBEC
40.0000 mg | DELAYED_RELEASE_TABLET | Freq: Every morning | ORAL | 0 refills | Status: DC
  Filled 2024-05-24: qty 90, 90d supply, fill #0

## 2024-06-03 ENCOUNTER — Other Ambulatory Visit (HOSPITAL_BASED_OUTPATIENT_CLINIC_OR_DEPARTMENT_OTHER): Payer: Self-pay

## 2024-06-03 MED ORDER — ZOLPIDEM TARTRATE 10 MG PO TABS
10.0000 mg | ORAL_TABLET | Freq: Every evening | ORAL | 0 refills | Status: DC | PRN
  Filled 2024-06-03: qty 30, 30d supply, fill #0

## 2024-06-18 ENCOUNTER — Other Ambulatory Visit (HOSPITAL_BASED_OUTPATIENT_CLINIC_OR_DEPARTMENT_OTHER): Payer: Self-pay

## 2024-06-18 MED ORDER — OXYCODONE HCL 20 MG PO TABS
20.0000 mg | ORAL_TABLET | Freq: Three times a day (TID) | ORAL | 0 refills | Status: DC | PRN
  Filled 2024-06-18: qty 90, 30d supply, fill #0

## 2024-06-21 ENCOUNTER — Other Ambulatory Visit (HOSPITAL_BASED_OUTPATIENT_CLINIC_OR_DEPARTMENT_OTHER): Payer: Self-pay

## 2024-06-21 MED ORDER — MOUNJARO 2.5 MG/0.5ML ~~LOC~~ SOAJ
2.5000 mg | SUBCUTANEOUS | 1 refills | Status: AC
  Filled 2024-06-21: qty 2, 28d supply, fill #0
  Filled 2024-07-19: qty 2, 28d supply, fill #1

## 2024-06-24 ENCOUNTER — Ambulatory Visit: Admitting: Podiatry

## 2024-07-02 ENCOUNTER — Other Ambulatory Visit (HOSPITAL_BASED_OUTPATIENT_CLINIC_OR_DEPARTMENT_OTHER): Payer: Self-pay

## 2024-07-02 MED ORDER — ZOLPIDEM TARTRATE 10 MG PO TABS
10.0000 mg | ORAL_TABLET | Freq: Every evening | ORAL | 0 refills | Status: DC | PRN
  Filled 2024-07-02: qty 30, 30d supply, fill #0

## 2024-07-15 ENCOUNTER — Other Ambulatory Visit (HOSPITAL_BASED_OUTPATIENT_CLINIC_OR_DEPARTMENT_OTHER): Payer: Self-pay

## 2024-07-15 MED ORDER — OXYCODONE HCL 20 MG PO TABS
20.0000 mg | ORAL_TABLET | Freq: Three times a day (TID) | ORAL | 0 refills | Status: DC | PRN
  Filled 2024-07-19: qty 90, 30d supply, fill #0

## 2024-07-19 ENCOUNTER — Other Ambulatory Visit (HOSPITAL_BASED_OUTPATIENT_CLINIC_OR_DEPARTMENT_OTHER): Payer: Self-pay

## 2024-07-22 ENCOUNTER — Other Ambulatory Visit (HOSPITAL_BASED_OUTPATIENT_CLINIC_OR_DEPARTMENT_OTHER): Payer: Self-pay

## 2024-07-22 MED ORDER — PANTOPRAZOLE SODIUM 40 MG PO TBEC
40.0000 mg | DELAYED_RELEASE_TABLET | Freq: Every morning | ORAL | 0 refills | Status: AC
  Filled 2024-07-22 – 2024-08-16 (×2): qty 90, 90d supply, fill #0

## 2024-07-22 MED ORDER — METOCLOPRAMIDE HCL 10 MG PO TABS
10.0000 mg | ORAL_TABLET | Freq: Two times a day (BID) | ORAL | 2 refills | Status: AC
  Filled 2024-07-22: qty 60, 30d supply, fill #0
  Filled 2024-08-16: qty 60, 30d supply, fill #1
  Filled 2024-10-12: qty 60, 30d supply, fill #2

## 2024-07-23 ENCOUNTER — Inpatient Hospital Stay: Attending: Oncology

## 2024-07-23 DIAGNOSIS — Z85048 Personal history of other malignant neoplasm of rectum, rectosigmoid junction, and anus: Secondary | ICD-10-CM | POA: Diagnosis present

## 2024-07-23 DIAGNOSIS — C20 Malignant neoplasm of rectum: Secondary | ICD-10-CM

## 2024-07-23 LAB — CEA (ACCESS): CEA (CHCC): 2.46 ng/mL (ref 0.00–5.00)

## 2024-07-25 NOTE — Progress Notes (Unsigned)
 [facility]  [address] [place],  [place]  [address] [phone]  Clinic Day:  01/23/2024  Referring physician: Pia Kerney SQUIBB, MD  HISTORY OF PRESENT ILLNESS:  The patient is a 58 y.o. female with stage IIB (T3 N0 M0) rectal cancer, who comes in today for routine follow-up.  Her definitive therapy consisted of chemoradiation, followed by a low anterior resection, followed by adjuvant FOLFOX chemotherapy - all of which was completed in February 2022.  CT scans done after the completion of all of her definitive therapy showed complete disease resolution.  She comes into clinic today for routine follow-up.  Since her last visit, the patient has been doing well.  She denies having any abdominal pain, rectal bleeding, or other GI symptoms which concern her for disease recurrence.      PHYSICAL EXAM:  There were no vitals taken for this visit. Wt Readings from Last 3 Encounters:  01/23/24 157 lb 4.8 oz (71.4 kg)  07/25/23 180 lb 12.8 oz (82 kg)  01/23/23 148 lb 8 oz (67.4 kg)   There is no height or weight on file to calculate BMI. Performance status (ECOG): 1 Physical Exam Constitutional:      Appearance: Normal appearance. She is not ill-appearing.  HENT:     Mouth/Throat:     Mouth: Mucous membranes are moist.     Pharynx: Oropharynx is clear. No oropharyngeal exudate or posterior oropharyngeal erythema.  Cardiovascular:     Rate and Rhythm: Normal rate and regular rhythm.     Heart sounds: No murmur heard.    No friction rub. No gallop.  Pulmonary:     Effort: Pulmonary effort is normal. No respiratory distress.     Breath sounds: Normal breath sounds. No wheezing, rhonchi or rales.  Abdominal:     General: Bowel sounds are normal. There is no distension.     Palpations: Abdomen is soft. There is no mass.     Tenderness: There is no abdominal tenderness.  Musculoskeletal:        General: No swelling.     Right lower leg: No edema.      Left lower leg: No edema.  Lymphadenopathy:     Cervical: No cervical adenopathy.     Upper Body:     Right upper body: No supraclavicular or axillary adenopathy.     Left upper body: No supraclavicular or axillary adenopathy.     Lower Body: No right inguinal adenopathy. No left inguinal adenopathy.  Skin:    General: Skin is warm.     Coloration: Skin is not jaundiced.     Findings: No lesion or rash.  Neurological:     General: No focal deficit present.     Mental Status: She is alert and oriented to person, place, and time. Mental status is at baseline.  Psychiatric:        Mood and Affect: Mood normal.        Behavior: Behavior normal.        Thought Content: Thought content normal.    LABS:    Latest Reference Range & Units 07/17/23 14:03 01/22/24 12:52 07/23/24 13:28  CEA 0.0 - 4.7 ng/mL 2.3    CEA ([facility]) 0.00 - 5.00 ng/mL  1.97 2.46   ASSESSMENT & PLAN:  Assessment/Plan:  A 58 y.o. female with stage IIB rectal cancer, who is over 3 years out from the completion of all of her definitive therapy.  From a rectal cancer, she  remains disease free.  Clinically, the patient is doing very well.  As that is the case, I will see her back in 6 months for repeat clinical assessment.  The patient understands all the plans discussed today and is in agreement with them.    Gloria Hodge [name], MD

## 2024-07-26 ENCOUNTER — Inpatient Hospital Stay: Admitting: Oncology

## 2024-07-26 ENCOUNTER — Telehealth: Payer: Self-pay | Admitting: Oncology

## 2024-07-26 VITALS — BP 155/75 | HR 85 | Temp 99.0°F | Resp 16 | Ht 61.2 in | Wt 162.4 lb

## 2024-07-26 DIAGNOSIS — C20 Malignant neoplasm of rectum: Secondary | ICD-10-CM

## 2024-07-26 DIAGNOSIS — Z85048 Personal history of other malignant neoplasm of rectum, rectosigmoid junction, and anus: Secondary | ICD-10-CM | POA: Diagnosis not present

## 2024-07-26 NOTE — Telephone Encounter (Signed)
 Patient has been scheduled for follow-up visit per 07/26/24 LOS.  Pt given an appt calendar with date and time.

## 2024-08-03 ENCOUNTER — Other Ambulatory Visit (HOSPITAL_BASED_OUTPATIENT_CLINIC_OR_DEPARTMENT_OTHER): Payer: Self-pay

## 2024-08-03 MED ORDER — ZOLPIDEM TARTRATE 10 MG PO TABS
10.0000 mg | ORAL_TABLET | Freq: Every evening | ORAL | 1 refills | Status: DC | PRN
  Filled 2024-08-03: qty 30, 30d supply, fill #0
  Filled 2024-09-03: qty 30, 30d supply, fill #1

## 2024-08-04 ENCOUNTER — Other Ambulatory Visit (HOSPITAL_BASED_OUTPATIENT_CLINIC_OR_DEPARTMENT_OTHER): Payer: Self-pay

## 2024-08-06 ENCOUNTER — Other Ambulatory Visit (HOSPITAL_BASED_OUTPATIENT_CLINIC_OR_DEPARTMENT_OTHER): Payer: Self-pay

## 2024-08-06 MED ORDER — SENNOSIDES-DOCUSATE SODIUM 8.6-50 MG PO TABS
2.0000 | ORAL_TABLET | Freq: Every day | ORAL | 0 refills | Status: AC
  Filled 2024-08-06: qty 60, 30d supply, fill #0

## 2024-08-06 MED ORDER — CELECOXIB 200 MG PO CAPS
200.0000 mg | ORAL_CAPSULE | Freq: Two times a day (BID) | ORAL | 0 refills | Status: AC
  Filled 2024-08-06: qty 60, 30d supply, fill #0

## 2024-08-09 ENCOUNTER — Other Ambulatory Visit (HOSPITAL_BASED_OUTPATIENT_CLINIC_OR_DEPARTMENT_OTHER): Payer: Self-pay

## 2024-08-11 ENCOUNTER — Other Ambulatory Visit (HOSPITAL_COMMUNITY): Payer: Self-pay

## 2024-08-12 ENCOUNTER — Other Ambulatory Visit (HOSPITAL_BASED_OUTPATIENT_CLINIC_OR_DEPARTMENT_OTHER): Payer: Self-pay

## 2024-08-12 MED ORDER — OXYCODONE HCL 20 MG PO TABS
20.0000 mg | ORAL_TABLET | Freq: Three times a day (TID) | ORAL | 0 refills | Status: DC | PRN
  Filled 2024-08-16: qty 90, 30d supply, fill #0

## 2024-08-13 ENCOUNTER — Other Ambulatory Visit (HOSPITAL_BASED_OUTPATIENT_CLINIC_OR_DEPARTMENT_OTHER): Payer: Self-pay

## 2024-08-13 MED ORDER — MOUNJARO 5 MG/0.5ML ~~LOC~~ SOAJ
5.0000 mg | SUBCUTANEOUS | 1 refills | Status: DC
  Filled 2024-08-13: qty 2, 28d supply, fill #0
  Filled 2024-09-13: qty 2, 28d supply, fill #1

## 2024-08-16 ENCOUNTER — Other Ambulatory Visit (HOSPITAL_BASED_OUTPATIENT_CLINIC_OR_DEPARTMENT_OTHER): Payer: Self-pay

## 2024-08-23 ENCOUNTER — Other Ambulatory Visit (HOSPITAL_BASED_OUTPATIENT_CLINIC_OR_DEPARTMENT_OTHER): Payer: Self-pay

## 2024-08-23 MED ORDER — TOUJEO MAX SOLOSTAR 300 UNIT/ML ~~LOC~~ SOPN
PEN_INJECTOR | SUBCUTANEOUS | 3 refills | Status: AC
  Filled 2024-08-23: qty 6, 30d supply, fill #0

## 2024-09-03 ENCOUNTER — Other Ambulatory Visit (HOSPITAL_COMMUNITY): Payer: Self-pay

## 2024-09-03 ENCOUNTER — Other Ambulatory Visit (HOSPITAL_BASED_OUTPATIENT_CLINIC_OR_DEPARTMENT_OTHER): Payer: Self-pay

## 2024-09-08 ENCOUNTER — Other Ambulatory Visit (HOSPITAL_BASED_OUTPATIENT_CLINIC_OR_DEPARTMENT_OTHER): Payer: Self-pay

## 2024-09-08 MED ORDER — CYCLOBENZAPRINE HCL 10 MG PO TABS
10.0000 mg | ORAL_TABLET | Freq: Every evening | ORAL | 0 refills | Status: DC | PRN
  Filled 2024-09-08: qty 30, 30d supply, fill #0

## 2024-09-13 ENCOUNTER — Other Ambulatory Visit (HOSPITAL_BASED_OUTPATIENT_CLINIC_OR_DEPARTMENT_OTHER): Payer: Self-pay

## 2024-09-13 MED ORDER — OXYCODONE HCL 20 MG PO TABS
20.0000 mg | ORAL_TABLET | Freq: Three times a day (TID) | ORAL | 0 refills | Status: DC | PRN
  Filled 2024-09-13: qty 90, 30d supply, fill #0

## 2024-10-01 ENCOUNTER — Other Ambulatory Visit (HOSPITAL_BASED_OUTPATIENT_CLINIC_OR_DEPARTMENT_OTHER): Payer: Self-pay

## 2024-10-01 MED ORDER — ZOLPIDEM TARTRATE 10 MG PO TABS
10.0000 mg | ORAL_TABLET | Freq: Every evening | ORAL | 0 refills | Status: AC | PRN
  Filled 2024-10-01: qty 30, 30d supply, fill #0

## 2024-10-12 ENCOUNTER — Other Ambulatory Visit (HOSPITAL_BASED_OUTPATIENT_CLINIC_OR_DEPARTMENT_OTHER): Payer: Self-pay

## 2024-10-12 MED ORDER — CYCLOBENZAPRINE HCL 10 MG PO TABS
10.0000 mg | ORAL_TABLET | Freq: Every evening | ORAL | 0 refills | Status: AC | PRN
  Filled 2024-10-12: qty 30, 30d supply, fill #0

## 2024-10-12 MED ORDER — OXYCODONE HCL 20 MG PO TABS
20.0000 mg | ORAL_TABLET | Freq: Three times a day (TID) | ORAL | 0 refills | Status: DC | PRN
  Filled 2024-10-12: qty 90, 30d supply, fill #0

## 2024-10-15 ENCOUNTER — Other Ambulatory Visit (HOSPITAL_BASED_OUTPATIENT_CLINIC_OR_DEPARTMENT_OTHER): Payer: Self-pay

## 2024-10-15 MED ORDER — MOUNJARO 5 MG/0.5ML ~~LOC~~ SOAJ
5.0000 mg | SUBCUTANEOUS | 1 refills | Status: AC
  Filled 2024-10-15: qty 2, 28d supply, fill #0

## 2024-10-21 ENCOUNTER — Other Ambulatory Visit (HOSPITAL_BASED_OUTPATIENT_CLINIC_OR_DEPARTMENT_OTHER): Payer: Self-pay

## 2024-10-21 ENCOUNTER — Other Ambulatory Visit: Payer: Self-pay

## 2024-10-25 ENCOUNTER — Other Ambulatory Visit (HOSPITAL_BASED_OUTPATIENT_CLINIC_OR_DEPARTMENT_OTHER): Payer: Self-pay

## 2024-10-26 ENCOUNTER — Other Ambulatory Visit (HOSPITAL_BASED_OUTPATIENT_CLINIC_OR_DEPARTMENT_OTHER): Payer: Self-pay

## 2024-10-26 MED ORDER — LOSARTAN POTASSIUM 25 MG PO TABS
25.0000 mg | ORAL_TABLET | Freq: Every day | ORAL | 0 refills | Status: AC
  Filled 2024-10-26: qty 30, 30d supply, fill #0

## 2024-10-26 MED ORDER — GABAPENTIN 600 MG PO TABS
300.0000 mg | ORAL_TABLET | Freq: Three times a day (TID) | ORAL | 0 refills | Status: AC
  Filled 2024-10-26: qty 1, 1d supply, fill #0

## 2024-10-26 MED ORDER — AMLODIPINE BESYLATE 5 MG PO TABS
10.0000 mg | ORAL_TABLET | Freq: Every day | ORAL | 0 refills | Status: AC
  Filled 2024-10-26: qty 60, 30d supply, fill #0

## 2024-11-04 ENCOUNTER — Other Ambulatory Visit (HOSPITAL_BASED_OUTPATIENT_CLINIC_OR_DEPARTMENT_OTHER): Payer: Self-pay

## 2024-11-04 MED ORDER — TRAZODONE HCL 100 MG PO TABS
100.0000 mg | ORAL_TABLET | Freq: Every day | ORAL | 2 refills | Status: AC
  Filled 2024-11-04: qty 30, 30d supply, fill #0

## 2024-11-08 ENCOUNTER — Other Ambulatory Visit (HOSPITAL_BASED_OUTPATIENT_CLINIC_OR_DEPARTMENT_OTHER): Payer: Self-pay

## 2024-11-08 MED ORDER — OXYCODONE HCL 20 MG PO TABS
20.0000 mg | ORAL_TABLET | Freq: Three times a day (TID) | ORAL | 0 refills | Status: AC | PRN
  Filled 2024-11-09: qty 90, 30d supply, fill #0

## 2024-11-09 ENCOUNTER — Other Ambulatory Visit (HOSPITAL_BASED_OUTPATIENT_CLINIC_OR_DEPARTMENT_OTHER): Payer: Self-pay

## 2024-11-12 ENCOUNTER — Other Ambulatory Visit (HOSPITAL_BASED_OUTPATIENT_CLINIC_OR_DEPARTMENT_OTHER): Payer: Self-pay

## 2025-03-24 ENCOUNTER — Inpatient Hospital Stay

## 2025-03-25 ENCOUNTER — Inpatient Hospital Stay: Admitting: Oncology
